# Patient Record
Sex: Female | Born: 1980
Health system: Southern US, Community
[De-identification: ages and names within clinical notes are randomized; demographics above are authoritative.]

## PROBLEM LIST (undated history)

## (undated) DIAGNOSIS — I10 Essential (primary) hypertension: Secondary | ICD-10-CM

## (undated) DIAGNOSIS — S43002A Unspecified subluxation of left shoulder joint, initial encounter: Secondary | ICD-10-CM

## (undated) DIAGNOSIS — K219 Gastro-esophageal reflux disease without esophagitis: Secondary | ICD-10-CM

## (undated) DIAGNOSIS — R262 Difficulty in walking, not elsewhere classified: Secondary | ICD-10-CM

## (undated) DIAGNOSIS — Z87820 Personal history of traumatic brain injury: Secondary | ICD-10-CM

## (undated) DIAGNOSIS — T4145XA Adverse effect of unspecified anesthetic, initial encounter: Secondary | ICD-10-CM

## (undated) DIAGNOSIS — T8859XA Other complications of anesthesia, initial encounter: Secondary | ICD-10-CM

## (undated) DIAGNOSIS — R252 Cramp and spasm: Secondary | ICD-10-CM

---

## 2001-12-09 HISTORY — PX: TUBAL LIGATION: SHX77

## 2003-07-14 ENCOUNTER — Encounter: Payer: Self-pay | Admitting: Oral & Maxillofacial Surgery

## 2003-07-14 ENCOUNTER — Inpatient Hospital Stay (HOSPITAL_COMMUNITY): Admission: AD | Admit: 2003-07-14 | Discharge: 2003-07-16 | Payer: Self-pay | Admitting: Dentistry

## 2003-07-15 ENCOUNTER — Encounter: Payer: Self-pay | Admitting: Oral & Maxillofacial Surgery

## 2014-01-04 ENCOUNTER — Emergency Department (HOSPITAL_COMMUNITY): Payer: No Typology Code available for payment source

## 2014-01-04 ENCOUNTER — Inpatient Hospital Stay (HOSPITAL_COMMUNITY)
Admission: EM | Admit: 2014-01-04 | Discharge: 2014-02-07 | DRG: 003 | Disposition: A | Discharge status: Skilled Nursing Facility | Payer: No Typology Code available for payment source | Attending: General Surgery | Admitting: General Surgery

## 2014-01-04 ENCOUNTER — Encounter (HOSPITAL_COMMUNITY): Admission: EM | Disposition: A | Discharge status: Skilled Nursing Facility | Payer: Self-pay | Source: Home / Self Care

## 2014-01-04 ENCOUNTER — Inpatient Hospital Stay (HOSPITAL_COMMUNITY): Payer: No Typology Code available for payment source | Admitting: Anesthesiology

## 2014-01-04 ENCOUNTER — Encounter (HOSPITAL_COMMUNITY): Payer: No Typology Code available for payment source | Admitting: Anesthesiology

## 2014-01-04 ENCOUNTER — Encounter (HOSPITAL_COMMUNITY): Payer: Self-pay | Admitting: Emergency Medicine

## 2014-01-04 DIAGNOSIS — S52022A Displaced fracture of olecranon process without intraarticular extension of left ulna, initial encounter for closed fracture: Secondary | ICD-10-CM | POA: Diagnosis present

## 2014-01-04 DIAGNOSIS — S02401A Maxillary fracture, unspecified, initial encounter for closed fracture: Secondary | ICD-10-CM | POA: Diagnosis present

## 2014-01-04 DIAGNOSIS — S52609A Unspecified fracture of lower end of unspecified ulna, initial encounter for closed fracture: Secondary | ICD-10-CM

## 2014-01-04 DIAGNOSIS — S02109A Fracture of base of skull, unspecified side, initial encounter for closed fracture: Principal | ICD-10-CM | POA: Diagnosis present

## 2014-01-04 DIAGNOSIS — S52202A Unspecified fracture of shaft of left ulna, initial encounter for closed fracture: Secondary | ICD-10-CM | POA: Diagnosis present

## 2014-01-04 DIAGNOSIS — S069X0S Unspecified intracranial injury without loss of consciousness, sequela: Secondary | ICD-10-CM

## 2014-01-04 DIAGNOSIS — I82629 Acute embolism and thrombosis of deep veins of unspecified upper extremity: Secondary | ICD-10-CM | POA: Diagnosis not present

## 2014-01-04 DIAGNOSIS — IMO0002 Reserved for concepts with insufficient information to code with codable children: Secondary | ICD-10-CM | POA: Diagnosis not present

## 2014-01-04 DIAGNOSIS — S43002A Unspecified subluxation of left shoulder joint, initial encounter: Secondary | ICD-10-CM

## 2014-01-04 DIAGNOSIS — Y831 Surgical operation with implant of artificial internal device as the cause of abnormal reaction of the patient, or of later complication, without mention of misadventure at the time of the procedure: Secondary | ICD-10-CM | POA: Diagnosis not present

## 2014-01-04 DIAGNOSIS — R000 Tachycardia, unspecified: Secondary | ICD-10-CM | POA: Diagnosis not present

## 2014-01-04 DIAGNOSIS — S0181XA Laceration without foreign body of other part of head, initial encounter: Secondary | ICD-10-CM | POA: Diagnosis present

## 2014-01-04 DIAGNOSIS — D696 Thrombocytopenia, unspecified: Secondary | ICD-10-CM | POA: Diagnosis present

## 2014-01-04 DIAGNOSIS — Z87820 Personal history of traumatic brain injury: Secondary | ICD-10-CM

## 2014-01-04 DIAGNOSIS — S272XXA Traumatic hemopneumothorax, initial encounter: Secondary | ICD-10-CM | POA: Diagnosis present

## 2014-01-04 DIAGNOSIS — S22009A Unspecified fracture of unspecified thoracic vertebra, initial encounter for closed fracture: Secondary | ICD-10-CM | POA: Diagnosis present

## 2014-01-04 DIAGNOSIS — S12600A Unspecified displaced fracture of seventh cervical vertebra, initial encounter for closed fracture: Secondary | ICD-10-CM | POA: Diagnosis present

## 2014-01-04 DIAGNOSIS — S02400A Malar fracture unspecified, initial encounter for closed fracture: Secondary | ICD-10-CM | POA: Diagnosis present

## 2014-01-04 DIAGNOSIS — E870 Hyperosmolality and hypernatremia: Secondary | ICD-10-CM | POA: Diagnosis not present

## 2014-01-04 DIAGNOSIS — J95821 Acute postprocedural respiratory failure: Secondary | ICD-10-CM

## 2014-01-04 DIAGNOSIS — S0280XA Fracture of other specified skull and facial bones, unspecified side, initial encounter for closed fracture: Secondary | ICD-10-CM | POA: Diagnosis present

## 2014-01-04 DIAGNOSIS — J189 Pneumonia, unspecified organism: Secondary | ICD-10-CM | POA: Diagnosis not present

## 2014-01-04 DIAGNOSIS — S52302A Unspecified fracture of shaft of left radius, initial encounter for closed fracture: Secondary | ICD-10-CM

## 2014-01-04 DIAGNOSIS — S42309B Unspecified fracture of shaft of humerus, unspecified arm, initial encounter for open fracture: Secondary | ICD-10-CM | POA: Diagnosis present

## 2014-01-04 DIAGNOSIS — S06309A Unspecified focal traumatic brain injury with loss of consciousness of unspecified duration, initial encounter: Principal | ICD-10-CM

## 2014-01-04 DIAGNOSIS — S0100XA Unspecified open wound of scalp, initial encounter: Secondary | ICD-10-CM | POA: Diagnosis present

## 2014-01-04 DIAGNOSIS — D62 Acute posthemorrhagic anemia: Secondary | ICD-10-CM | POA: Diagnosis present

## 2014-01-04 DIAGNOSIS — H0520 Unspecified exophthalmos: Secondary | ICD-10-CM | POA: Diagnosis present

## 2014-01-04 DIAGNOSIS — S270XXA Traumatic pneumothorax, initial encounter: Secondary | ICD-10-CM

## 2014-01-04 DIAGNOSIS — J9600 Acute respiratory failure, unspecified whether with hypoxia or hypercapnia: Secondary | ICD-10-CM | POA: Diagnosis present

## 2014-01-04 DIAGNOSIS — S069X9A Unspecified intracranial injury with loss of consciousness of unspecified duration, initial encounter: Secondary | ICD-10-CM

## 2014-01-04 DIAGNOSIS — S0180XA Unspecified open wound of other part of head, initial encounter: Secondary | ICD-10-CM | POA: Diagnosis present

## 2014-01-04 DIAGNOSIS — N179 Acute kidney failure, unspecified: Secondary | ICD-10-CM | POA: Diagnosis present

## 2014-01-04 DIAGNOSIS — S2249XA Multiple fractures of ribs, unspecified side, initial encounter for closed fracture: Secondary | ICD-10-CM | POA: Diagnosis present

## 2014-01-04 DIAGNOSIS — S3609XA Other injury of spleen, initial encounter: Secondary | ICD-10-CM | POA: Diagnosis present

## 2014-01-04 DIAGNOSIS — S42352B Displaced comminuted fracture of shaft of humerus, left arm, initial encounter for open fracture: Secondary | ICD-10-CM | POA: Diagnosis present

## 2014-01-04 DIAGNOSIS — S069XAA Unspecified intracranial injury with loss of consciousness status unknown, initial encounter: Secondary | ICD-10-CM

## 2014-01-04 DIAGNOSIS — S22008A Other fracture of unspecified thoracic vertebra, initial encounter for closed fracture: Secondary | ICD-10-CM | POA: Diagnosis present

## 2014-01-04 DIAGNOSIS — S52509A Unspecified fracture of the lower end of unspecified radius, initial encounter for closed fracture: Secondary | ICD-10-CM | POA: Diagnosis present

## 2014-01-04 DIAGNOSIS — R4189 Other symptoms and signs involving cognitive functions and awareness: Secondary | ICD-10-CM | POA: Diagnosis present

## 2014-01-04 DIAGNOSIS — T17408A Unspecified foreign body in trachea causing other injury, initial encounter: Secondary | ICD-10-CM | POA: Diagnosis not present

## 2014-01-04 DIAGNOSIS — S5290XA Unspecified fracture of unspecified forearm, initial encounter for closed fracture: Secondary | ICD-10-CM

## 2014-01-04 DIAGNOSIS — S0285XA Fracture of orbit, unspecified, initial encounter for closed fracture: Secondary | ICD-10-CM | POA: Diagnosis present

## 2014-01-04 DIAGNOSIS — T8140XA Infection following a procedure, unspecified, initial encounter: Secondary | ICD-10-CM | POA: Diagnosis not present

## 2014-01-04 DIAGNOSIS — S2242XA Multiple fractures of ribs, left side, initial encounter for closed fracture: Secondary | ICD-10-CM | POA: Diagnosis present

## 2014-01-04 DIAGNOSIS — T17808A Unspecified foreign body in other parts of respiratory tract causing other injury, initial encounter: Secondary | ICD-10-CM

## 2014-01-04 DIAGNOSIS — S0292XA Unspecified fracture of facial bones, initial encounter for closed fracture: Secondary | ICD-10-CM

## 2014-01-04 DIAGNOSIS — E876 Hypokalemia: Secondary | ICD-10-CM | POA: Diagnosis not present

## 2014-01-04 DIAGNOSIS — S52023A Displaced fracture of olecranon process without intraarticular extension of unspecified ulna, initial encounter for closed fracture: Secondary | ICD-10-CM | POA: Diagnosis present

## 2014-01-04 DIAGNOSIS — S42001A Fracture of unspecified part of right clavicle, initial encounter for closed fracture: Secondary | ICD-10-CM | POA: Diagnosis present

## 2014-01-04 DIAGNOSIS — S065XAA Traumatic subdural hemorrhage with loss of consciousness status unknown, initial encounter: Principal | ICD-10-CM | POA: Diagnosis present

## 2014-01-04 DIAGNOSIS — T17508A Unspecified foreign body in bronchus causing other injury, initial encounter: Secondary | ICD-10-CM

## 2014-01-04 DIAGNOSIS — R739 Hyperglycemia, unspecified: Secondary | ICD-10-CM | POA: Diagnosis not present

## 2014-01-04 DIAGNOSIS — R4182 Altered mental status, unspecified: Secondary | ICD-10-CM

## 2014-01-04 DIAGNOSIS — S42023A Displaced fracture of shaft of unspecified clavicle, initial encounter for closed fracture: Secondary | ICD-10-CM | POA: Diagnosis present

## 2014-01-04 DIAGNOSIS — S36039A Unspecified laceration of spleen, initial encounter: Secondary | ICD-10-CM | POA: Diagnosis present

## 2014-01-04 DIAGNOSIS — S064XAA Epidural hemorrhage with loss of consciousness status unknown, initial encounter: Secondary | ICD-10-CM | POA: Diagnosis present

## 2014-01-04 DIAGNOSIS — F068 Other specified mental disorders due to known physiological condition: Secondary | ICD-10-CM | POA: Diagnosis present

## 2014-01-04 HISTORY — PX: ORIF RADIAL FRACTURE: SHX5113

## 2014-01-04 HISTORY — PX: ORIF ULNAR FRACTURE: SHX5417

## 2014-01-04 HISTORY — PX: ORIF HUMERUS FRACTURE: SHX2126

## 2014-01-04 HISTORY — DX: Personal history of traumatic brain injury: Z87.820

## 2014-01-04 HISTORY — DX: Unspecified subluxation of left shoulder joint, initial encounter: S43.002A

## 2014-01-04 HISTORY — PX: ORIF ELBOW FRACTURE: SHX5031

## 2014-01-04 LAB — COMPREHENSIVE METABOLIC PANEL
ALK PHOS: 49 U/L (ref 39–117)
ALT: 68 U/L — AB (ref 0–35)
AST: 125 U/L — ABNORMAL HIGH (ref 0–37)
Albumin: 3.8 g/dL (ref 3.5–5.2)
BUN: 13 mg/dL (ref 6–23)
CALCIUM: 8.1 mg/dL — AB (ref 8.4–10.5)
CO2: 19 mEq/L (ref 19–32)
Chloride: 105 mEq/L (ref 96–112)
Creatinine, Ser: 0.63 mg/dL (ref 0.50–1.10)
GFR calc Af Amer: >90 mL/min (ref >90)
GFR calc non Af Amer: >90 mL/min (ref >90)
Glucose, Bld: 191 mg/dL — ABNORMAL HIGH (ref 70–99)
POTASSIUM: 3.3 meq/L — AB (ref 3.7–5.3)
SODIUM: 141 meq/L (ref 137–147)
TOTAL PROTEIN: 6.2 g/dL (ref 6.0–8.3)
Total Bilirubin: 0.2 mg/dL — ABNORMAL LOW (ref 0.3–1.2)

## 2014-01-04 LAB — POCT I-STAT 3, ART BLOOD GAS (G3+)
ACID-BASE DEFICIT: 2 mmol/L (ref 0.0–2.0)
BICARBONATE: 22.5 meq/L (ref 20.0–24.0)
O2 Saturation: 100 %
PCO2 ART: 34.2 mmHg — AB (ref 35.0–45.0)
Patient temperature: 35.7
TCO2: 24 mmol/L (ref 0–100)
pH, Arterial: 7.421 (ref 7.350–7.450)
pO2, Arterial: 371 mmHg — ABNORMAL HIGH (ref 80.0–100.0)

## 2014-01-04 LAB — CBC
HCT: 29.3 % — ABNORMAL LOW (ref 36.0–46.0)
HEMOGLOBIN: 9 g/dL — AB (ref 12.0–15.0)
MCH: 23.2 pg — ABNORMAL LOW (ref 26.0–34.0)
MCHC: 30.7 g/dL (ref 30.0–36.0)
MCV: 75.5 fL — AB (ref 78.0–100.0)
Platelets: 303 10*3/uL (ref 150–400)
RBC: 3.88 MIL/uL (ref 3.87–5.11)
RDW: 16.6 % — AB (ref 11.5–15.5)
WBC: 19.3 10*3/uL — ABNORMAL HIGH (ref 4.0–10.5)

## 2014-01-04 LAB — POCT I-STAT, CHEM 8
BUN: 14 mg/dL (ref 6–23)
Calcium, Ion: 1.02 mmol/L — ABNORMAL LOW (ref 1.12–1.23)
Chloride: 106 mEq/L (ref 96–112)
Creatinine, Ser: 0.8 mg/dL (ref 0.50–1.10)
Glucose, Bld: 187 mg/dL — ABNORMAL HIGH (ref 70–99)
HEMATOCRIT: 29 % — AB (ref 36.0–46.0)
HEMOGLOBIN: 9.9 g/dL — AB (ref 12.0–15.0)
POTASSIUM: 3.3 meq/L — AB (ref 3.7–5.3)
SODIUM: 139 meq/L (ref 137–147)
TCO2: 20 mmol/L (ref 0–100)

## 2014-01-04 LAB — CG4 I-STAT (LACTIC ACID): Lactic Acid, Venous: 2.56 mmol/L — ABNORMAL HIGH (ref 0.5–2.2)

## 2014-01-04 LAB — PROTIME-INR
INR: 1.15 (ref 0.00–1.49)
Prothrombin Time: 14.5 seconds (ref 11.6–15.2)

## 2014-01-04 LAB — MRSA PCR SCREENING: MRSA by PCR: NEGATIVE

## 2014-01-04 LAB — POCT I-STAT TROPONIN I: Troponin i, poc: 0 ng/mL (ref 0.00–0.08)

## 2014-01-04 LAB — LACTIC ACID, PLASMA: LACTIC ACID, VENOUS: 3 mmol/L — AB (ref 0.5–2.2)

## 2014-01-04 LAB — ABO/RH: ABO/RH(D): O NEG

## 2014-01-04 SURGERY — OPEN REDUCTION INTERNAL FIXATION (ORIF) ULNAR FRACTURE
Anesthesia: General | Site: Arm Upper | Laterality: Left

## 2014-01-04 MED ORDER — SODIUM CHLORIDE 0.9 % IJ SOLN
INTRAMUSCULAR | Status: AC
  Filled 2014-01-04: qty 10

## 2014-01-04 MED ORDER — PROPOFOL 10 MG/ML IV EMUL
INTRAVENOUS | Status: AC
  Administered 2014-01-04: 1000 mg
  Filled 2014-01-04: qty 100

## 2014-01-04 MED ORDER — ROCURONIUM BROMIDE 50 MG/5ML IV SOLN
INTRAVENOUS | Status: AC | PRN
  Administered 2014-01-04: 10 mg via INTRAVENOUS

## 2014-01-04 MED ORDER — SODIUM CHLORIDE 0.9 % IV SOLN: INTRAVENOUS | Status: DC | PRN

## 2014-01-04 MED ORDER — IOHEXOL 300 MG/ML  SOLN
100.0000 mL | Freq: Once | INTRAMUSCULAR | Status: AC | PRN
  Administered 2014-01-04: 100 mL via INTRAVENOUS

## 2014-01-04 MED ORDER — BIOTENE DRY MOUTH MT LIQD
15.0000 mL | Freq: Four times a day (QID) | OROMUCOSAL | Status: DC
  Administered 2014-01-05 – 2014-02-07 (×131): 15 mL via OROMUCOSAL

## 2014-01-04 MED ORDER — LACTATED RINGERS IV SOLN
INTRAVENOUS | Status: DC | PRN
  Administered 2014-01-04: 21:00:00 via INTRAVENOUS

## 2014-01-04 MED ORDER — FENTANYL CITRATE 0.05 MG/ML IJ SOLN
100.0000 ug | INTRAMUSCULAR | Status: DC | PRN
  Administered 2014-01-05: 25 ug via INTRAVENOUS
  Administered 2014-01-05: 50 ug via INTRAVENOUS
  Administered 2014-01-05 – 2014-01-13 (×7): 100 ug via INTRAVENOUS
  Filled 2014-01-04 (×10): qty 2

## 2014-01-04 MED ORDER — ETOMIDATE 2 MG/ML IV SOLN
INTRAVENOUS | Status: AC | PRN
  Administered 2014-01-04: 20 mg via INTRAVENOUS

## 2014-01-04 MED ORDER — CLINDAMYCIN PHOSPHATE 600 MG/50ML IV SOLN
600.0000 mg | Freq: Once | INTRAVENOUS | Status: DC
  Filled 2014-01-04: qty 50

## 2014-01-04 MED ORDER — ROCURONIUM BROMIDE 100 MG/10ML IV SOLN
INTRAVENOUS | Status: DC | PRN
  Administered 2014-01-04: 50 mg via INTRAVENOUS
  Administered 2014-01-04: 40 mg via INTRAVENOUS
  Administered 2014-01-04: 50 mg via INTRAVENOUS

## 2014-01-04 MED ORDER — PROPOFOL 10 MG/ML IV EMUL
0.0000 ug/kg/min | INTRAVENOUS | Status: DC
Start: 2014-01-04 — End: 2014-01-04
  Administered 2014-01-04: 20 ug/kg/min via INTRAVENOUS

## 2014-01-04 MED ORDER — CLINDAMYCIN PHOSPHATE 600 MG/50ML IV SOLN
INTRAVENOUS | Status: AC
  Administered 2014-01-04: 600 mg via INTRAVENOUS
  Filled 2014-01-04: qty 50

## 2014-01-04 MED ORDER — SUFENTANIL CITRATE 50 MCG/ML IV SOLN
INTRAVENOUS | Status: AC
  Filled 2014-01-04: qty 1

## 2014-01-04 MED ORDER — ONDANSETRON HCL 4 MG/2ML IJ SOLN: 4.0000 mg | Freq: Four times a day (QID) | INTRAMUSCULAR | Status: DC | PRN

## 2014-01-04 MED ORDER — PANTOPRAZOLE SODIUM 40 MG PO TBEC: 40.0000 mg | DELAYED_RELEASE_TABLET | Freq: Every day | ORAL | Status: DC

## 2014-01-04 MED ORDER — 0.9 % SODIUM CHLORIDE (POUR BTL) OPTIME
TOPICAL | Status: DC | PRN
  Administered 2014-01-04: 1000 mL

## 2014-01-04 MED ORDER — CHLORHEXIDINE GLUCONATE 0.12 % MT SOLN
15.0000 mL | Freq: Two times a day (BID) | OROMUCOSAL | Status: DC
  Administered 2014-01-05 – 2014-02-07 (×65): 15 mL via OROMUCOSAL
  Filled 2014-01-04 (×60): qty 15

## 2014-01-04 MED ORDER — ONDANSETRON HCL 4 MG PO TABS: 4.0000 mg | ORAL_TABLET | Freq: Four times a day (QID) | ORAL | Status: DC | PRN

## 2014-01-04 MED ORDER — ROCURONIUM BROMIDE 50 MG/5ML IV SOLN
INTRAVENOUS | Status: AC
  Filled 2014-01-04: qty 1

## 2014-01-04 MED ORDER — PANTOPRAZOLE SODIUM 40 MG IV SOLR
40.0000 mg | Freq: Every day | INTRAVENOUS | Status: DC
  Administered 2014-01-05 – 2014-01-14 (×10): 40 mg via INTRAVENOUS
  Filled 2014-01-04 (×11): qty 40

## 2014-01-04 MED ORDER — SUFENTANIL CITRATE 50 MCG/ML IV SOLN
INTRAVENOUS | Status: DC | PRN
  Administered 2014-01-04 (×2): 10 ug via INTRAVENOUS
  Administered 2014-01-04: 5 ug via INTRAVENOUS
  Administered 2014-01-04: 10 ug via INTRAVENOUS
  Administered 2014-01-04: 20 ug via INTRAVENOUS
  Administered 2014-01-04: 5 ug via INTRAVENOUS
  Administered 2014-01-05 (×2): 10 ug via INTRAVENOUS
  Administered 2014-01-05: 20 ug via INTRAVENOUS

## 2014-01-04 MED ORDER — POTASSIUM CHLORIDE IN NACL 20-0.9 MEQ/L-% IV SOLN
INTRAVENOUS | Status: DC
  Administered 2014-01-05 – 2014-01-07 (×4): via INTRAVENOUS
  Filled 2014-01-04 (×8): qty 1000

## 2014-01-04 MED ORDER — SODIUM CHLORIDE 0.9 % IV SOLN
INTRAVENOUS | Status: DC | PRN
  Administered 2014-01-04 (×3): via INTRAVENOUS

## 2014-01-04 MED ORDER — PROPOFOL 10 MG/ML IV EMUL
0.0000 ug/kg/min | INTRAVENOUS | Status: DC
  Administered 2014-01-05 – 2014-01-06 (×3): 30 ug/kg/min via INTRAVENOUS
  Administered 2014-01-06: 20 ug/kg/min via INTRAVENOUS
  Administered 2014-01-06: 30 ug/kg/min via INTRAVENOUS
  Administered 2014-01-07: 25 ug/kg/min via INTRAVENOUS
  Filled 2014-01-04 (×7): qty 100

## 2014-01-04 SURGICAL SUPPLY — 97 items
BANDAGE ELASTIC 3 VELCRO ST LF (GAUZE/BANDAGES/DRESSINGS) IMPLANT
BANDAGE ELASTIC 4 VELCRO ST LF (GAUZE/BANDAGES/DRESSINGS) ×3 IMPLANT
BANDAGE ELASTIC 6 VELCRO ST LF (GAUZE/BANDAGES/DRESSINGS) ×3 IMPLANT
BANDAGE GAUZE ELAST BULKY 4 IN (GAUZE/BANDAGES/DRESSINGS) IMPLANT
BIT DRILL 2.5X2.75 QC CALB (BIT) ×3 IMPLANT
BIT DRILL 3.5X5.5 QC CALB (BIT) ×3 IMPLANT
BIT DRILL CALIBRATED 2.7 (BIT) ×3 IMPLANT
BLADE SURG 10 STRL SS (BLADE) ×3 IMPLANT
BLADE SURG 15 STRL LF DISP TIS (BLADE) ×4 IMPLANT
BLADE SURG 15 STRL SS (BLADE) ×2
BNDG COHESIVE 4X5 TAN STRL (GAUZE/BANDAGES/DRESSINGS) ×3 IMPLANT
BNDG ESMARK 4X9 LF (GAUZE/BANDAGES/DRESSINGS) ×3 IMPLANT
BNDG GAUZE ELAST 4 BULKY (GAUZE/BANDAGES/DRESSINGS) ×6 IMPLANT
CANISTER SUCTION 2500CC (MISCELLANEOUS) ×3 IMPLANT
COVER SURGICAL LIGHT HANDLE (MISCELLANEOUS) ×6 IMPLANT
CUFF TOURNIQUET SINGLE 18IN (TOURNIQUET CUFF) ×3 IMPLANT
DRAPE EXTREMITY T 121X128X90 (DRAPE) ×3 IMPLANT
DRAPE OEC MINIVIEW 54X84 (DRAPES) ×3 IMPLANT
DRAPE U-SHAPE 47X51 STRL (DRAPES) ×6 IMPLANT
ELECT REM PT RETURN 9FT ADLT (ELECTROSURGICAL) ×3
ELECTRODE REM PT RTRN 9FT ADLT (ELECTROSURGICAL) ×2 IMPLANT
FACESHIELD OPICON STD (MASK) ×3 IMPLANT
GAUZE XEROFORM 5X9 LF (GAUZE/BANDAGES/DRESSINGS) ×12 IMPLANT
GLOVE BIO SURGEON STRL SZ7.5 (GLOVE) ×6 IMPLANT
GLOVE BIOGEL PI IND STRL 6.5 (GLOVE) ×6 IMPLANT
GLOVE BIOGEL PI IND STRL 7.5 (GLOVE) ×2 IMPLANT
GLOVE BIOGEL PI IND STRL 8 (GLOVE) ×6 IMPLANT
GLOVE BIOGEL PI INDICATOR 6.5 (GLOVE) ×3
GLOVE BIOGEL PI INDICATOR 7.5 (GLOVE) ×1
GLOVE BIOGEL PI INDICATOR 8 (GLOVE) ×3
GLOVE ECLIPSE 6.5 STRL STRAW (GLOVE) ×3 IMPLANT
GLOVE ECLIPSE 7.5 STRL STRAW (GLOVE) ×6 IMPLANT
GLOVE SURG SS PI 7.5 STRL IVOR (GLOVE) ×6 IMPLANT
GOWN PREVENTION PLUS XLARGE (GOWN DISPOSABLE) IMPLANT
GOWN SRG XL XLNG 56XLVL 4 (GOWN DISPOSABLE) IMPLANT
GOWN STRL NON-REIN LRG LVL3 (GOWN DISPOSABLE) IMPLANT
GOWN STRL NON-REIN XL XLG LVL4 (GOWN DISPOSABLE)
GOWN STRL REUS W/ TWL LRG LVL3 (GOWN DISPOSABLE) ×2 IMPLANT
GOWN STRL REUS W/ TWL XL LVL3 (GOWN DISPOSABLE) ×4 IMPLANT
GOWN STRL REUS W/TWL LRG LVL3 (GOWN DISPOSABLE) ×1
GOWN STRL REUS W/TWL XL LVL3 (GOWN DISPOSABLE) ×2
K-WIRE ACE 1.6X6 (WIRE) ×3
K-WIRE FIXATION 2.0X6 (WIRE) ×6
KIT BASIN OR (CUSTOM PROCEDURE TRAY) ×3 IMPLANT
KIT ROOM TURNOVER OR (KITS) ×3 IMPLANT
KWIRE ACE 1.6X6 (WIRE) ×2 IMPLANT
KWIRE FIXATION 2.0X6 (WIRE) ×4 IMPLANT
NEEDLE 22X1 1/2 (OR ONLY) (NEEDLE) IMPLANT
NS IRRIG 1000ML POUR BTL (IV SOLUTION) ×3 IMPLANT
PACK ORTHO EXTREMITY (CUSTOM PROCEDURE TRAY) ×3 IMPLANT
PAD ABD 8X10 STRL (GAUZE/BANDAGES/DRESSINGS) ×9 IMPLANT
PAD ARMBOARD 7.5X6 YLW CONV (MISCELLANEOUS) ×6 IMPLANT
PAD CAST 4YDX4 CTTN HI CHSV (CAST SUPPLIES) IMPLANT
PADDING CAST COTTON 4X4 STRL (CAST SUPPLIES)
PADDING CAST COTTON 6X4 STRL (CAST SUPPLIES) ×3 IMPLANT
PLATE DIS HUMERUS SM EX-ARTIC (Plate) ×3 IMPLANT
PLATE LOCK 8H 103 BILAT FIB (Plate) ×3 IMPLANT
PLATE LOCK COMP 6H FOOT (Plate) ×3 IMPLANT
PLATE OLECRANON SM (Plate) ×3 IMPLANT
SCREW CORT T15 24X3.5XST LCK (Screw) ×2 IMPLANT
SCREW CORTICAL 3.5MM  12MM (Screw) ×1 IMPLANT
SCREW CORTICAL 3.5MM  20MM (Screw) ×3 IMPLANT
SCREW CORTICAL 3.5MM  28MM (Screw) ×1 IMPLANT
SCREW CORTICAL 3.5MM 12MM (Screw) ×2 IMPLANT
SCREW CORTICAL 3.5MM 14MM (Screw) ×9 IMPLANT
SCREW CORTICAL 3.5MM 20MM (Screw) ×6 IMPLANT
SCREW CORTICAL 3.5MM 22MM (Screw) ×9 IMPLANT
SCREW CORTICAL 3.5MM 28MM (Screw) ×2 IMPLANT
SCREW CORTICAL 3.5X24MM (Screw) ×1 IMPLANT
SCREW LOCK CORT STAR 3.5X10 (Screw) ×30 IMPLANT
SCREW LOCK CORT STAR 3.5X12 (Screw) ×6 IMPLANT
SCREW LOCK CORT STAR 3.5X14 (Screw) ×3 IMPLANT
SCREW LOCK CORT STAR 3.5X16 (Screw) ×6 IMPLANT
SCREW LOCK CORT STAR 3.5X18 (Screw) ×3 IMPLANT
SCREW LOW PROFILE 12MMX3.5MM (Screw) ×3 IMPLANT
SCREW LOW PROFILE 18MMX3.5MM (Screw) ×6 IMPLANT
SCREW LP 3.5X44 (Screw) ×3 IMPLANT
SCREW NON LOCKING LP 3.5 14MM (Screw) ×3 IMPLANT
SPLINT FIBERGLASS 4X30 (CAST SUPPLIES) ×3 IMPLANT
SPONGE GAUZE 4X4 12PLY (GAUZE/BANDAGES/DRESSINGS) ×6 IMPLANT
SPONGE LAP 18X18 X RAY DECT (DISPOSABLE) ×6 IMPLANT
SPONGE LAP 4X18 X RAY DECT (DISPOSABLE) ×3 IMPLANT
STAPLER VISISTAT 35W (STAPLE) ×12 IMPLANT
STOCKINETTE IMPERVIOUS LG (DRAPES) ×3 IMPLANT
SUCTION FRAZIER TIP 10 FR DISP (SUCTIONS) ×3 IMPLANT
SUT ETHILON 4 0 PS 2 18 (SUTURE) IMPLANT
SUT FIBERWIRE 3-0 18 DIAM 3/8 (SUTURE) ×3
SUT VIC AB 2-0 CT1 27 (SUTURE) ×5
SUT VIC AB 2-0 CT1 TAPERPNT 27 (SUTURE) ×10 IMPLANT
SUTURE FIBERWR 3-0 18 DIAM 3/8 (SUTURE) ×2 IMPLANT
SYR CONTROL 10ML LL (SYRINGE) IMPLANT
TOWEL OR 17X24 6PK STRL BLUE (TOWEL DISPOSABLE) ×3 IMPLANT
TOWEL OR 17X26 10 PK STRL BLUE (TOWEL DISPOSABLE) ×9 IMPLANT
TUBE CONNECTING 12X1/4 (SUCTIONS) ×3 IMPLANT
WASHER 3.5MM (Orthopedic Implant) ×3 IMPLANT
WATER STERILE IRR 1000ML POUR (IV SOLUTION) IMPLANT
YANKAUER SUCT BULB TIP NO VENT (SUCTIONS) ×3 IMPLANT

## 2014-01-04 NOTE — H&P (Signed)
Critical Care 92 minutes. Patient examined and I agree with the assessment and plan I also D/W Dr. Berenice Primas and Dr. Christella Noa, and Dr. Janace Hoard. OK to go to OR with the above. Georganna Skeans, MD, MPH, FACS Pager: [phone]  01/04/2014 5:20 PM

## 2014-01-04 NOTE — OR Nursing (Signed)
Spoke to [place], [place] nurse.  She agreed to update family for OR @ 2245.

## 2014-01-04 NOTE — Consult Note (Signed)
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 extra-axial hematoma. 3. Large left temporal scalp hematoma. Left periorbital soft tissue swelling. 4. Nondisplaced fracture through the lateral wall of the left orbit extending into the squamous level portion of the left temporal bone. There is a small amount of orbital hematoma with mild left proptosis. 5. Nondisplaced left transverse process fractures of C7, T1, and T2. 6. Left pneumothorax, more fully evaluated on separate chest CT. Critical Value/emergent results were called by telephone at the time of interpretation on 01/04/2014 at 4:50 PM to Dr. Donald Siva, who verbally acknowledged these results.   Electronically Signed   By: Logan Bores   On: 01/04/2014 17:27    ROS Blood pressure 130/80, pulse 92, resp. rate 18, SpO2 100.00%. Physical Exam  HENT:  She is sedated and unresponsive. The left eye has a hematoma of the upper eyelid. The pupils seem to be equal but responsive vision cannot be assessed. There is a left eyebrow laceration of approximately 2-1/2 cm. Does pass through the lateral brow. The nose looks clear with no evidence of trauma or hematoma of the septum. Endotracheal tube in the oral cavity. There is a left parietal temporal hematoma or swelling     Assessment/Plan: Left  facial laceration/orbital fracture-the facial laceration was closed after injecting 1% lidocaine with 1 100,000 epinephrine and irrigated with Betadine and saline. The wound was closed with interrupted 4-0 chromic and a 4-0 running nylon. The brow was positioned in its anatomic location. The left hematoma a 18-gauge needle was passed into the area and no aspirate of obvious fluid was obtained. She should not need any repair of the orbital fracture. She should have the eye evaluated by ophthalmology especially with the intraorbital hematoma. Antibiotic cream to the left brow laceration. Sutures out in approximately 7 days. Melissa Montane 01/04/2014, 6:32 PM

## 2014-01-04 NOTE — ED Notes (Addendum)
Pt. Arrived via [facility] ,  Unresponsive, GCS 6, Pt arrived with an NPA in the RT. Nare 100% BVM  Arrived with lsb and c-collar, Pt.has an open lt. humerous fx and closed lt. Radial ulna fx , noted by deformity, intact radial pulse.  3 cm laceration above lt. Eye. And a large hematoma in her lt temporal area. Abrasion to lt. Knee and bruising to lt. Shin area.  Bleeding controlled at the laceration site.

## 2014-01-04 NOTE — ED Notes (Addendum)
Pt.s mother given money that was in pt.s pocket.  Clothes were given to pt.s boyfriend,.

## 2014-01-04 NOTE — ED Notes (Signed)
NOTIFIED DR. THOMPSON IN PERSON OF  [facility] PATIENTS LAB RESULTS OF I-STAT TROPONIN ,CG4+ LACTIC ACID  ,@17 :30PM ,01/04/2014.

## 2014-01-04 NOTE — Anesthesia Preprocedure Evaluation (Addendum)
Anesthesia Evaluation  Patient identified by MRN, date of birth, ID band Patient awake and Patient unresponsive    Reviewed: Allergy & Precautions, H&P , NPO status , Patient's Chart, lab work & pertinent test results, Unable to perform ROS - Chart review only  Airway Mallampati: II TM Distance: >3 FB Neck ROM: full    Dental   Pulmonary neg pulmonary ROS,  breath sounds clear to auscultation    + intubated    Cardiovascular negative cardio ROS  Rhythm:regular Rate:Tachycardia     Neuro/Psych See HPI  C-spine not cleared negative psych ROS   GI/Hepatic negative GI ROS, Neg liver ROS,   Endo/Other  negative endocrine ROS  Renal/GU negative Renal ROS  negative genitourinary   Musculoskeletal   Abdominal   Peds  Hematology negative hematology ROS (+)   Anesthesia Other Findings See surgeon's H&P   Reproductive/Obstetrics negative OB ROS                           Anesthesia Physical Anesthesia Plan  ASA: IV and emergent  Anesthesia Plan: General   Post-op Pain Management:    Induction: Intravenous  Airway Management Planned: Oral ETT  Additional Equipment:   Intra-op Plan:   Post-operative Plan: Post-operative intubation/ventilation  Informed Consent: I have reviewed the patients History and Physical, chart, labs and discussed the procedure including the risks, benefits and alternatives for the proposed anesthesia with the patient or authorized representative who has indicated his/her understanding and acceptance.   Dental Advisory Given  Plan Discussed with: CRNA and Surgeon  Anesthesia Plan Comments:         Anesthesia Quick Evaluation

## 2014-01-04 NOTE — ED Notes (Signed)
NOTIFIED DR. ZAVITZ IN PERSON OF L[facility] PATIENTS LAB RESULTS OF I-STAT CHEM 8 + ,16:38 PM, 0127/2015.

## 2014-01-04 NOTE — ED Notes (Signed)
Pt. transported to 27M-07 on Ventilator, for surgery this p/m., AMBU bag/mask placed @ bedside, unit RT notified, Fi02 <'d to 40% while awaiting transport to surgery, uneventful transport.

## 2014-01-04 NOTE — ED Notes (Addendum)
Started Driprivan at 10 mcg and increased to 15 mcg/kg/min  Pt began to move her rt. Arm . Edd Arbour, RN at the bedside calming pt.
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
## 2014-01-04 NOTE — Procedures (Signed)
Chest Tube Insertion Procedure Note  Indications:  Clinically significant Left Pneumothorax  Pre-operative Diagnosis: Left Pneumothorax  Post-operative Diagnosis: Left Pneumothorax  Procedure Details  Emergency consent was obtained for the procedure.Time out done.  After sterile skin prep, using standard technique, a 20 French tube was placed in the left anterior axillary line, nipple level.  Findings: Small rush of air  Estimated Blood Loss:  Minimal         Specimens:  None              Complications:  none         Disposition: ED, intubated and critically ill         Georganna Skeans, MD, MPH, FACS Pager: [phone]

## 2014-01-04 NOTE — Progress Notes (Signed)
Correction to previous note: Chaplain notes for different patient.   Corrected Note is as follows:  Chaplain asked to help with contacting family. Chaplain called several numbers. The first number was disconnected. The second number, the chaplain reached the patient's ex-husband and the third number was for a different person. Chaplain provided ED secretary with details.

## 2014-01-04 NOTE — ED Notes (Signed)
Dr. Janace Hoard at the bedside suturing laceration of the lt. eye

## 2014-01-04 NOTE — ED Notes (Signed)
Informed Sydney RN that pt.s family is in the Waiting area on the 3rd floor. Pt. Transferred to 3M07 with assist of resp. Therapist and Marissa Calamity and Cecille Rubin, RN
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
## 2014-01-04 NOTE — ED Notes (Signed)
Pt. s family, parents, children an boyfriend at the bedside.  Chaplain with the family.
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
## 2014-01-04 NOTE — Progress Notes (Signed)
Chaplain in ED on another call. Nurse asked chaplain to provide support to patient's family. Chaplain offered emotional and spiritual support to family.   01/04/14 1649  Clinical Encounter Type  Visited With Family  Visit Type Initial;ED  Referral From Nurse

## 2014-01-04 NOTE — Consult Note (Signed)
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 hypopharynx. Left-sided pneumothorax and parenchymal lung opacity is partially visualized.  IMPRESSION: 1. Several punctate foci of hyperattenuation within both cerebral hemispheres consistent with small foci of hemorrhagic contusion. 2. Tiny left temporoparietal extra-axial hematoma. 3. Large left temporal scalp hematoma. Left periorbital soft tissue swelling. 4. Nondisplaced fracture through the lateral wall of the left orbit extending into the squamous level portion of the left temporal bone. There is a small amount of orbital hematoma with mild left proptosis. 5. Nondisplaced left transverse process fractures of C7, T1, and T2. 6. Left pneumothorax, more fully evaluated on separate chest CT. Critical Value/emergent results were called by telephone at the time of interpretation on 01/04/2014 at 4:50 PM to Dr. Donald Siva, who verbally acknowledged these results.   Electronically Signed   By: Logan Bores   On: 01/04/2014 17:27    Review of Systems  Unable to perform ROS: patient unresponsive   Blood pressure 148/63, pulse 136, temperature 100.9 F (38.3 C), temperature source Core (Comment), resp. rate 25, weight 66.1 kg (145 lb 11.6 oz), SpO2 100.00%. Physical Exam  Constitutional: She  appears well-developed and well-nourished.  HENT:  Dried blood in hair, periorbital ecchymosis on the left with significant periorbital edema  Eyes: Pupils are equal, round, and reactive to light.  Neurological: She is unresponsive. GCS eye subscore is 1. GCS verbal subscore is 1. GCS motor subscore is 3.  Sedated, paralytics on board for initial exam.  Unable to fully assess sensory, motor, language Did not cough, no corneals,     Assessment/Plan: Pt did improve slightly and I did observe her localizing with right upper extremity. Will not place ventricular catheter at this time. Ventricles are quite small. OK for OR to deal with long bone injuries. Needs repeat ct in am.   Aubryana Vittorio L 01/04/2014, 9:38 PM
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
## 2014-01-05 ENCOUNTER — Inpatient Hospital Stay (HOSPITAL_COMMUNITY): Payer: No Typology Code available for payment source

## 2014-01-05 ENCOUNTER — Encounter (HOSPITAL_COMMUNITY): Payer: Self-pay | Admitting: *Deleted

## 2014-01-05 DIAGNOSIS — S52302A Unspecified fracture of shaft of left radius, initial encounter for closed fracture: Secondary | ICD-10-CM

## 2014-01-05 DIAGNOSIS — S52022A Displaced fracture of olecranon process without intraarticular extension of left ulna, initial encounter for closed fracture: Secondary | ICD-10-CM | POA: Diagnosis present

## 2014-01-05 DIAGNOSIS — S42352B Displaced comminuted fracture of shaft of humerus, left arm, initial encounter for open fracture: Secondary | ICD-10-CM | POA: Diagnosis present

## 2014-01-05 DIAGNOSIS — S52202A Unspecified fracture of shaft of left ulna, initial encounter for closed fracture: Secondary | ICD-10-CM | POA: Diagnosis present

## 2014-01-05 DIAGNOSIS — S42001A Fracture of unspecified part of right clavicle, initial encounter for closed fracture: Secondary | ICD-10-CM | POA: Diagnosis present

## 2014-01-05 DIAGNOSIS — S064XAA Epidural hemorrhage with loss of consciousness status unknown, initial encounter: Secondary | ICD-10-CM | POA: Diagnosis not present

## 2014-01-05 LAB — CBC
HCT: 32.4 % — ABNORMAL LOW (ref 36.0–46.0)
HCT: 29.8 % — ABNORMAL LOW (ref 36.0–46.0)
HEMOGLOBIN: 11.4 g/dL — AB (ref 12.0–15.0)
Hemoglobin: 10.4 g/dL — ABNORMAL LOW (ref 12.0–15.0)
MCH: 27.3 pg (ref 26.0–34.0)
MCH: 27.1 pg (ref 26.0–34.0)
MCHC: 35.2 g/dL (ref 30.0–36.0)
MCHC: 34.9 g/dL (ref 30.0–36.0)
MCV: 77.7 fL — ABNORMAL LOW (ref 78.0–100.0)
MCV: 77.6 fL — ABNORMAL LOW (ref 78.0–100.0)
PLATELETS: 135 10*3/uL — AB (ref 150–400)
Platelets: 135 10*3/uL — ABNORMAL LOW (ref 150–400)
RBC: 4.17 MIL/uL (ref 3.87–5.11)
RBC: 3.84 MIL/uL — ABNORMAL LOW (ref 3.87–5.11)
RDW: 15.3 % (ref 11.5–15.5)
RDW: 15.8 % — ABNORMAL HIGH (ref 11.5–15.5)
WBC: 11.4 10*3/uL — ABNORMAL HIGH (ref 4.0–10.5)
WBC: 10.1 10*3/uL (ref 4.0–10.5)

## 2014-01-05 LAB — TYPE AND SCREEN
ABO/RH(D): O NEG
ANTIBODY SCREEN: NEGATIVE
UNIT DIVISION: 0
UNIT DIVISION: 0
UNIT DIVISION: 0
Unit division: 0
Unit division: 0
Unit division: 0

## 2014-01-05 LAB — BASIC METABOLIC PANEL
BUN: 18 mg/dL (ref 6–23)
BUN: 12 mg/dL (ref 6–23)
CALCIUM: 7.3 mg/dL — AB (ref 8.4–10.5)
CO2: 19 mEq/L (ref 19–32)
CO2: 21 mEq/L (ref 19–32)
Calcium: 7.7 mg/dL — ABNORMAL LOW (ref 8.4–10.5)
Chloride: 111 mEq/L (ref 96–112)
Chloride: 111 mEq/L (ref 96–112)
Creatinine, Ser: 0.68 mg/dL (ref 0.50–1.10)
Creatinine, Ser: 0.67 mg/dL (ref 0.50–1.10)
GFR calc Af Amer: >90 mL/min (ref >90)
Glucose, Bld: 144 mg/dL — ABNORMAL HIGH (ref 70–99)
Glucose, Bld: 124 mg/dL — ABNORMAL HIGH (ref 70–99)
Potassium: 4.6 mEq/L (ref 3.7–5.3)
Potassium: 4.1 mEq/L (ref 3.7–5.3)
SODIUM: 141 meq/L (ref 137–147)
SODIUM: 143 meq/L (ref 137–147)

## 2014-01-05 LAB — BLOOD PRODUCT ORDER (VERBAL) VERIFICATION

## 2014-01-05 LAB — POCT I-STAT 7, (LYTES, BLD GAS, ICA,H+H)
Acid-base deficit: 2 mmol/L (ref 0.0–2.0)
Acid-base deficit: 3 mmol/L — ABNORMAL HIGH (ref 0.0–2.0)
Acid-base deficit: 4 mmol/L — ABNORMAL HIGH (ref 0.0–2.0)
BICARBONATE: 23.6 meq/L (ref 20.0–24.0)
Bicarbonate: 22 meq/L (ref 20.0–24.0)
Bicarbonate: 22.4 meq/L (ref 20.0–24.0)
CALCIUM ION: 1.08 mmol/L — AB (ref 1.12–1.23)
CALCIUM ION: 1.1 mmol/L — AB (ref 1.12–1.23)
Calcium, Ion: 1.07 mmol/L — ABNORMAL LOW (ref 1.12–1.23)
HCT: 20 % — ABNORMAL LOW (ref 36.0–46.0)
HCT: 24 % — ABNORMAL LOW (ref 36.0–46.0)
HCT: 33 % — ABNORMAL LOW (ref 36.0–46.0)
HEMOGLOBIN: 11.2 g/dL — AB (ref 12.0–15.0)
HEMOGLOBIN: 8.2 g/dL — AB (ref 12.0–15.0)
Hemoglobin: 6.8 g/dL — CL (ref 12.0–15.0)
O2 SAT: 100 %
O2 Saturation: 100 %
O2 Saturation: 100 %
PCO2 ART: 45.3 mmHg — AB (ref 35.0–45.0)
PCO2 ART: 45.4 mmHg — AB (ref 35.0–45.0)
PH ART: 7.352 (ref 7.350–7.450)
PH ART: 7.327 — AB (ref 7.350–7.450)
PO2 ART: 453 mmHg — AB (ref 80.0–100.0)
POTASSIUM: 4.6 meq/L (ref 3.7–5.3)
Patient temperature: 37.6
Potassium: 3.3 mEq/L — ABNORMAL LOW (ref 3.7–5.3)
Potassium: 3.9 meq/L (ref 3.7–5.3)
SODIUM: 141 meq/L (ref 137–147)
Sodium: 140 mEq/L (ref 137–147)
Sodium: 140 meq/L (ref 137–147)
TCO2: 23 mmol/L (ref 0–100)
TCO2: 25 mmol/L (ref 0–100)
TCO2: 24 mmol/L (ref 0–100)
pCO2 arterial: 40.6 mmHg (ref 35.0–45.0)
pH, Arterial: 7.294 — ABNORMAL LOW (ref 7.350–7.450)
pO2, Arterial: 457 mmHg — ABNORMAL HIGH (ref 80.0–100.0)
pO2, Arterial: 464 mmHg — ABNORMAL HIGH (ref 80.0–100.0)

## 2014-01-05 LAB — BLOOD GAS, ARTERIAL
Acid-base deficit: 3 mmol/L — ABNORMAL HIGH (ref 0.0–2.0)
BICARBONATE: 21.2 meq/L (ref 20.0–24.0)
Drawn by: 331761
FIO2: 0.4 %
MECHVT: 500 mL
O2 SAT: 99.3 %
PEEP: 5 cmH2O
PH ART: 7.389 (ref 7.350–7.450)
Patient temperature: 98.6
RATE: 15 resp/min
TCO2: 22.3 mmol/L (ref 0–100)
pCO2 arterial: 35.9 mmHg (ref 35.0–45.0)
pO2, Arterial: 175 mmHg — ABNORMAL HIGH (ref 80.0–100.0)

## 2014-01-05 LAB — CDS SEROLOGY

## 2014-01-05 LAB — TRIGLYCERIDES: TRIGLYCERIDES: 70 mg/dL (ref <150)

## 2014-01-05 MED ORDER — ALBUMIN HUMAN 5 % IV SOLN
12.5000 g | Freq: Once | INTRAVENOUS | Status: AC
  Administered 2014-01-05: 12.5 g via INTRAVENOUS
  Filled 2014-01-05: qty 250

## 2014-01-05 MED ORDER — SUFENTANIL CITRATE 50 MCG/ML IV SOLN
INTRAVENOUS | Status: AC
  Filled 2014-01-05: qty 1

## 2014-01-05 MED ORDER — ACETAMINOPHEN 650 MG RE SUPP
650.0000 mg | Freq: Three times a day (TID) | RECTAL | Status: DC | PRN
  Administered 2014-01-05 – 2014-01-06 (×4): 650 mg via RECTAL
  Filled 2014-01-05 (×4): qty 1

## 2014-01-05 MED ORDER — CLINDAMYCIN PHOSPHATE 300 MG/50ML IV SOLN
300.0000 mg | Freq: Three times a day (TID) | INTRAVENOUS | Status: DC
  Administered 2014-01-05: 300 mg via INTRAVENOUS
  Filled 2014-01-05 (×3): qty 50

## 2014-01-05 MED ORDER — GENTAMICIN IN SALINE 1.2-0.9 MG/ML-% IV SOLN
60.0000 mg | Freq: Three times a day (TID) | INTRAVENOUS | Status: AC
  Administered 2014-01-05 – 2014-01-07 (×6): 60 mg via INTRAVENOUS
  Filled 2014-01-05 (×6): qty 50

## 2014-01-05 MED ORDER — CLINDAMYCIN PHOSPHATE 600 MG/50ML IV SOLN
600.0000 mg | Freq: Three times a day (TID) | INTRAVENOUS | Status: AC
  Administered 2014-01-05 – 2014-01-06 (×5): 600 mg via INTRAVENOUS
  Filled 2014-01-05 (×5): qty 50

## 2014-01-05 MED ORDER — ONDANSETRON HCL 4 MG/2ML IJ SOLN
INTRAMUSCULAR | Status: AC
  Filled 2014-01-05: qty 2
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
## 2014-01-05 NOTE — Progress Notes (Signed)
UR completed.  Codi Folkerts, RN BSN [facility] CCM Trauma/Neuro ICU Case Manager [phone]  

## 2014-01-05 NOTE — Anesthesia Postprocedure Evaluation (Signed)
  Anesthesia Post-op Note  Patient: [name]  Procedure(s) Performed: Procedure(s): OPEN REDUCTION INTERNAL FIXATION (ORIF) ULNAR FRACTURE (Left) OPEN REDUCTION INTERNAL FIXATION (ORIF) HUMERAL SHAFT FRACTURE (Left) OPEN REDUCTION INTERNAL FIXATION (ORIF) RADIAL FRACTURE (Left) OPEN REDUCTION INTERNAL FIXATION (ORIF) ELBOW/OLECRANON FRACTURE (Left)  Patient Location: ICU  Anesthesia Type:General  Level of Consciousness: sedated  Airway and Oxygen Therapy: Patient remains intubated per anesthesia plan and Patient placed on Ventilator (see vital sign flow sheet for setting)  Post-op Pain: none  Post-op Assessment: Post-op Vital signs reviewed, Patient's Cardiovascular Status Stable, Respiratory Function Stable, Patent Airway, No signs of Nausea or vomiting and Pain level controlled  Post-op Vital Signs: Reviewed and stable  Complications: No apparent anesthesia complications
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
## 2014-01-05 NOTE — Progress Notes (Signed)
Chaplain assumed this case from daytime chaplain who introduced me to pt's fiance in consult room B. Provided emotional and spiritual support for him until other family members arrived. He and I had prayer together. As family members arrived I brought them from ED waiting area to consult room. As more family and friends arrived I moved the whole group to consult room A so they could have more space. Upon getting clearance from medical staff I brought family members to bedside, first pt's parents, then her fiance and two teenage daughters, then others. Provided much one-on-one support for pt's dad who was particularly distressed. He greatly appreciated the support. When pt was moved to 3M07 I brought family and friends to [facility] waiting area. Pt's dad requested I have prayer with pt's daughters and him. After Dr. Jodie Echevaria examined pt he reported to family that pt did not have blood clot on brain or great pressure on brain. Family took some comfort in that report. Family was allowed to see pt in groups of two until she was taken to surgery for repair of broken arm. Family expressed appreciation for chaplain support.

## 2014-01-05 NOTE — Progress Notes (Signed)
Per radiologist, CXR shows increase of Left pneumothorax to 30%. Will discuss with MD on rounds. Pt weaning, 100% sat RR 19. No distress noted.

## 2014-01-05 NOTE — Progress Notes (Signed)
Subjective: 1 Day Post-Op Procedure(s) (LRB): OPEN REDUCTION INTERNAL FIXATION (ORIF) ULNAR FRACTURE (Left) OPEN REDUCTION INTERNAL FIXATION (ORIF) HUMERAL SHAFT FRACTURE (Left) OPEN REDUCTION INTERNAL FIXATION (ORIF) RADIAL FRACTURE (Left) OPEN REDUCTION INTERNAL FIXATION (ORIF) ELBOW/OLECRANON FRACTURE (Left) Patient intubated. Family states she was able to squeeze with her right hand.     Objective: Vital signs in last 24 hours: Temp:  [97.3 F (36.3 C)-100.9 F (38.3 C)] 100.8 F (38.2 C) (01/28 1200) Pulse Rate:  [86-155] 87 (01/28 1200) Resp:  [11-29] 23 (01/28 1200) BP: (97-159)/(60-101) 126/71 mmHg (01/28 1200) SpO2:  [100 %] 100 % (01/28 1200) Arterial Line BP: (133-297)/(67-287) 156/74 mmHg (01/28 1200) FiO2 (%):  [30 %-100 %] 30 % (01/28 1150) Weight:  [66.1 kg (145 lb 11.6 oz)-72.576 kg (160 lb)] 66.1 kg (145 lb 11.6 oz) (01/28 0900)  Intake/Output from previous day: 01/27 0701 - 01/28 0700 In: 5449.6 [I.V.:4132.6; Blood:1267; IV Piggyback:50] Out: 625 [Urine:525; Blood:100] Intake/Output this shift: Total I/O In: 700 [I.V.:400; IV Piggyback:300] Out: 400 [Urine:400]   Recent Labs  01/04/14 1642 01/04/14 2156 01/04/14 2303 01/05/14 0022 01/05/14 0555  HGB 9.0* 6.8* 8.2* 11.2* 11.4*    Recent Labs  01/04/14 1642  01/05/14 0022 01/05/14 0555  WBC 19.3*  --   --  11.4*  RBC 3.88  --   --  4.17  HCT 29.3*  < > 33.0* 32.4*  PLT 303  --   --  135*  < > = values in this interval not displayed.  Recent Labs  01/04/14 1642  01/05/14 0022 01/05/14 0555  NA 141  < > 140 141  K 3.3*  < > 4.6 4.6  CL 105  --   --  111  CO2 19  --   --  19  BUN 13  --   --  18  CREATININE 0.63  --   --  0.68  GLUCOSE 191*  --   --  144*  CALCIUM 8.1*  --   --  7.3*  < > = values in this interval not displayed.  Recent Labs  01/04/14 1642  INR 1.15   X ray: Right midshaft clavicle fracture.   Left upper extremity exam: Posterior splint intact. Moderate  swelling of fingers. Good capillary refill. Hand warm.  Right clavicle exam: Tender over mid right clavicle.    Assessment/Plan: 1 Day Post-Op Procedure(s) (LRB): OPEN REDUCTION INTERNAL FIXATION (ORIF) ULNAR FRACTURE (Left) OPEN REDUCTION INTERNAL FIXATION (ORIF) HUMERAL SHAFT FRACTURE (Left) OPEN REDUCTION INTERNAL FIXATION (ORIF) RADIAL FRACTURE (Left) OPEN REDUCTION INTERNAL FIXATION (ORIF) ELBOW/OLECRANON FRACTURE (Left) Right midshaft clavicle fracture  Plan: Continue IV abx due to open left humerus fracture.  At some point will apply sling to right upper extremity. Right clavicle fracture is not a surgical problem.  Treatment per trauma team Will follow   [name] G 01/05/2014, 1:46 PM

## 2014-01-05 NOTE — Transfer of Care (Signed)
Immediate Anesthesia Transfer of Care Note  Patient: [name]  Procedure(s) Performed: Procedure(s): OPEN REDUCTION INTERNAL FIXATION (ORIF) ULNAR FRACTURE (Left) OPEN REDUCTION INTERNAL FIXATION (ORIF) HUMERAL SHAFT FRACTURE (Left) OPEN REDUCTION INTERNAL FIXATION (ORIF) RADIAL FRACTURE (Left) OPEN REDUCTION INTERNAL FIXATION (ORIF) ELBOW/OLECRANON FRACTURE (Left)  Patient Location: ICU  Anesthesia Type:General  Level of Consciousness: unresponsive and Patient remains intubated per anesthesia plan  Airway & Oxygen Therapy: Patient remains intubated per anesthesia plan and Patient placed on Ventilator (see vital sign flow sheet for setting)  Post-op Assessment: Report given to NICU RN  Post vital signs: Reviewed and stable  Complications: No apparent anesthesia complications
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
## 2014-01-05 NOTE — Procedures (Signed)
Central Venous Catheter Insertion Procedure Note [name] [id number] 11/05/1981  Procedure: Insertion of Central Venous Catheter Indications: Drug and/or fluid administration  Procedure Details Consent: Risks of procedure as well as the alternatives and risks of each were explained to the (patient/caregiver).  Consent for procedure obtained. Time Out: Verified patient identification, verified procedure, site/side was marked, verified correct patient position, special equipment/implants available, medications/allergies/relevent history reviewed, required imaging and test results available.  Performed  Maximum sterile technique was used including antiseptics, cap, gloves, gown, hand hygiene, mask and sheet. Skin prep: Chlorhexidine; local anesthetic administered A antimicrobial bonded/coated triple lumen catheter was placed in the left subclavian vein using the Seldinger technique.  Evaluation Blood flow good Complications: No apparent complications Patient did tolerate procedure well. Chest X-ray ordered to verify placement.  CXR: pending.  Sajid Ruppert E 01/05/2014, 9:50 AM
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
## 2014-01-06 ENCOUNTER — Encounter (HOSPITAL_COMMUNITY): Payer: Self-pay | Admitting: Orthopedic Surgery

## 2014-01-06 ENCOUNTER — Inpatient Hospital Stay (HOSPITAL_COMMUNITY): Payer: No Typology Code available for payment source

## 2014-01-06 DIAGNOSIS — E871 Hypo-osmolality and hyponatremia: Secondary | ICD-10-CM

## 2014-01-06 LAB — BASIC METABOLIC PANEL
BUN: 9 mg/dL (ref 6–23)
BUN: 9 mg/dL (ref 6–23)
CHLORIDE: 115 meq/L — AB (ref 96–112)
CO2: 22 meq/L (ref 19–32)
CO2: 21 meq/L (ref 19–32)
CREATININE: 0.6 mg/dL (ref 0.50–1.10)
Calcium: 8.1 mg/dL — ABNORMAL LOW (ref 8.4–10.5)
Calcium: 8.3 mg/dL — ABNORMAL LOW (ref 8.4–10.5)
Chloride: 117 mEq/L — ABNORMAL HIGH (ref 96–112)
Creatinine, Ser: 0.6 mg/dL (ref 0.50–1.10)
GFR calc Af Amer: >90 mL/min (ref >90)
GFR calc Af Amer: >90 mL/min (ref >90)
GFR calc non Af Amer: >90 mL/min (ref >90)
GFR calc non Af Amer: >90 mL/min (ref >90)
Glucose, Bld: 116 mg/dL — ABNORMAL HIGH (ref 70–99)
Glucose, Bld: 118 mg/dL — ABNORMAL HIGH (ref 70–99)
Potassium: 4.1 mEq/L (ref 3.7–5.3)
Potassium: 4.1 mEq/L (ref 3.7–5.3)
SODIUM: 150 meq/L — AB (ref 137–147)
Sodium: 148 mEq/L — ABNORMAL HIGH (ref 137–147)

## 2014-01-06 LAB — CBC
HCT: 28.6 % — ABNORMAL LOW (ref 36.0–46.0)
Hemoglobin: 9.8 g/dL — ABNORMAL LOW (ref 12.0–15.0)
MCH: 27 pg (ref 26.0–34.0)
MCHC: 34.3 g/dL (ref 30.0–36.0)
MCV: 78.8 fL (ref 78.0–100.0)
PLATELETS: 126 10*3/uL — AB (ref 150–400)
RBC: 3.63 MIL/uL — AB (ref 3.87–5.11)
RDW: 16.4 % — ABNORMAL HIGH (ref 11.5–15.5)
WBC: 10.1 10*3/uL (ref 4.0–10.5)

## 2014-01-06 LAB — GLUCOSE, CAPILLARY
GLUCOSE-CAPILLARY: 112 mg/dL — AB (ref 70–99)
Glucose-Capillary: 121 mg/dL — ABNORMAL HIGH (ref 70–99)
Glucose-Capillary: 124 mg/dL — ABNORMAL HIGH (ref 70–99)

## 2014-01-06 LAB — POCT I-STAT 3, ART BLOOD GAS (G3+)
Acid-base deficit: 2 mmol/L (ref 0.0–2.0)
BICARBONATE: 22.4 meq/L (ref 20.0–24.0)
O2 SAT: 97 %
PO2 ART: 92 mmHg (ref 80.0–100.0)
TCO2: 23 mmol/L (ref 0–100)
pCO2 arterial: 35.4 mmHg (ref 35.0–45.0)
pH, Arterial: 7.41 (ref 7.350–7.450)

## 2014-01-06 MED ORDER — PIVOT 1.5 CAL PO LIQD
1000.0000 mL | ORAL | Status: DC
  Administered 2014-01-07: 1000 mL
  Filled 2014-01-06 (×4): qty 1000

## 2014-01-06 MED ORDER — SELENIUM 50 MCG PO TABS
200.0000 ug | ORAL_TABLET | Freq: Every day | ORAL | Status: AC
  Administered 2014-01-06 – 2014-01-11 (×6): 200 ug
  Filled 2014-01-06 (×8): qty 4

## 2014-01-06 MED ORDER — PIVOT 1.5 CAL PO LIQD
1000.0000 mL | ORAL | Status: DC
  Administered 2014-01-06: 1000 mL
  Filled 2014-01-06 (×2): qty 1000

## 2014-01-06 MED ORDER — FREE WATER
200.0000 mL | Freq: Three times a day (TID) | Status: DC
  Administered 2014-01-06 – 2014-01-07 (×3): 200 mL

## 2014-01-06 MED ORDER — VITAMIN C 500 MG PO TABS
1000.0000 mg | ORAL_TABLET | Freq: Three times a day (TID) | ORAL | Status: AC
  Administered 2014-01-06 – 2014-01-13 (×19): 1000 mg
  Filled 2014-01-06 (×21): qty 2

## 2014-01-06 MED ORDER — PRO-STAT SUGAR FREE PO LIQD
30.0000 mL | Freq: Two times a day (BID) | ORAL | Status: DC
  Administered 2014-01-06 – 2014-01-08 (×5): 30 mL
  Filled 2014-01-06 (×6): qty 30
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
## 2014-01-06 NOTE — Clinical Documentation Improvement (Signed)
THIS DOCUMENT IS NOT A PERMANENT PART OF THE MEDICAL RECORD  Please update your documentation within the medical record to reflect your response to this query. If you need help knowing how to do this please call [phone].  01/06/14  Dear Dr. Maggie Font Rolley Sims  Abnormal findings (laboratory, x-ray, MRI/CT scans, and other diagnostic results) are not coded and reported unless the physician indicates their clinical significance. The medical record reflects the following clinical findings. If possible, please help by clarifying the diagnostic and/or clinical significance of these abnormal findings. Thank you!  Component      pH, Arterial pCO2 arterial pO2, Arterial  Latest Ref Rng      7.350 - 7.450 35.0 - 45.0 mmHg 80.0 - 100.0 mmHg  01/04/2014     11:03 PM 7.327 (L) 45.3 (H) 464.0 (H)  01/05/2014     12:22 AM 7.294 (L) 45.4 (H) 453.0 (H)   Component      Bicarbonate TCO2 Acid-base deficit  Latest Ref Rng      20.0 - 24.0 mEq/L 0 - 100 mmol/L 0.0 - 2.0 mmol/L  01/04/2014     11:03 PM 23.6 25 2.0  01/05/2014     12:22 AM 22.0 23 4.0 (H)    Possible Clinical Conditions?  - Respiratory Acidosis  - Metabolic Acidosis  - Other condition (please document in the progress notes and/or discharge summary)   Reviewed:  no additional documentation provided   Thank You,  [place] Documentation Specialist: [place]   In a better effort to capture your patient's severity of illness, reflect appropriate length of stay and utilization of resources, a review of the medical record has revealed the following indicators.   Based on your clinical judgment, please clarify and document in a progress note and/or discharge summary the clinical condition associated with the following supporting information: In responding to this query please exercise your independent judgment.  The fact that a query is asked, does not imply that any  particular answer is desired or expected.

## 2014-01-06 NOTE — Clinical Documentation Improvement (Signed)
THIS DOCUMENT IS NOT A PERMANENT PART OF THE MEDICAL RECORD  Please update your documentation with the medical record to reflect your response to this query. If you need help knowing how to do this please call [phone].  01/06/14  Dear Dr. Grandville Silos!  Abnormal findings (laboratory, x-ray, MRI/CT scans, and other diagnostic results) are not coded and reported unless the physician indicates their clinical significance. The medical record reflects the following clinical findings. If possible, please help by clarifying the diagnostic and/or clinical significance of these abnormal findings. Thank you!   Component      HCT Hemoglobin  Latest Ref Rng      36.0 - 46.0 % 12.0 - 15.0 g/dL  01/04/2014     4:30 PM 29.0 (L) 9.9 (L)  01/04/2014     4:42 PM 29.3 (L) 9.0 (L)  01/04/2014     9:56 PM 20.0 (L) 6.8 (LL)  01/04/2014     11:03 PM 24.0 (L) 8.2 (L)  01/05/2014     12:22 AM 33.0 (L) 11.2 (L)  01/05/2014     5:55 AM 32.4 (L) 11.4 (L)  01/05/2014     4:15 PM 29.8 (L) 10.4 (L)  01/06/2014      28.6 (L) 9.8 (L)    Possible Clinical Conditions?  - Expected Acute Blood Loss Anemia  - Acute Blood Loss Anemia  - Other condition (please document in the progress notes and/or discharge summary)   Reviewed: additional documentation in the medical record  Thank You,  [place] Documentation Specialist: [place]    In an effort to better capture your patient's severity of illness, reflect appropriate length of stay and utilization of resources, a review of the patient medical record has revealed the following indicators.   Based on your clinical judgment, please clarify and document in a progress note and/or discharge summary the clinical condition associated with the following supporting information: In responding to this query please exercise your independent judgment.  The fact that a query is asked, does not imply that any  particular answer is desired or expected.

## 2014-01-06 NOTE — Progress Notes (Signed)
Orthopedic Tech Progress Note Patient Details:  [name] 19-Jul-1981 [id number]  Ortho Devices Ortho Device/Splint Location: kuzma sling Ortho Device/Splint Interventions: Application   Cammer, Theodoro Parma 01/06/2014, 9:14 AM

## 2014-01-06 NOTE — Progress Notes (Signed)
Patient ID: [name], female   DOB: Feb 08, 1981, 33 y.o.   MRN: [id number] Dx update: patient has acute blood loss anemia and hypokalemia. Georganna Skeans, MD, MPH, FACS Pager: [phone]

## 2014-01-06 NOTE — Progress Notes (Signed)
Subjective: 2 Days Post-Op Procedure(s) (LRB): OPEN REDUCTION INTERNAL FIXATION (ORIF) ULNAR FRACTURE (Left) OPEN REDUCTION INTERNAL FIXATION (ORIF) HUMERAL SHAFT FRACTURE (Left) OPEN REDUCTION INTERNAL FIXATION (ORIF) RADIAL FRACTURE (Left) OPEN REDUCTION INTERNAL FIXATION (ORIF) ELBOW/OLECRANON FRACTURE (Left) Patient reports pain as sedated.    Objective: Vital signs in last 24 hours: Temp:  [99 F (37.2 C)-100.9 F (38.3 C)] 99.5 F (37.5 C) (01/29 0800) Pulse Rate:  [86-124] 121 (01/29 0800) Resp:  [14-23] 20 (01/29 0800) BP: (110-189)/(67-100) 134/91 mmHg (01/29 0800) SpO2:  [99 %-100 %] 100 % (01/29 0800) Arterial Line BP: (136-173)/(67-87) 160/81 mmHg (01/29 0800) FiO2 (%):  [30 %] 30 % (01/29 0800) Weight:  [145 lb 11.6 oz (66.1 kg)] 145 lb 11.6 oz (66.1 kg) (01/28 0900)  Intake/Output from previous day: 01/28 0701 - 01/29 0700 In: 3005.3 [I.V.:2505.3; IV Piggyback:500] Out: 3620 [Urine:3130; Emesis/NG output:450; Chest Tube:40] Intake/Output this shift: Total I/O In: -  Out: 250 [Urine:250]   Recent Labs  01/04/14 2303 01/05/14 0022 01/05/14 0555 01/05/14 1615 01/06/14 0550  HGB 8.2* 11.2* 11.4* 10.4* 9.8*    Recent Labs  01/05/14 1615 01/06/14 0550  WBC 10.1 10.1  RBC 3.84* 3.63*  HCT 29.8* 28.6*  PLT 135* 126*    Recent Labs  01/05/14 1615 01/06/14 0550  NA 143 150*  K 4.1 4.1  CL 111 117*  CO2 21 22  BUN 12 9  CREATININE 0.67 0.60  GLUCOSE 124* 116*  CALCIUM 7.7* 8.1*    Recent Labs  01/04/14 1642  INR 1.15    Compartment soft  Assessment/Plan: 2 Days Post-Op Procedure(s) (LRB): OPEN REDUCTION INTERNAL FIXATION (ORIF) ULNAR FRACTURE (Left) OPEN REDUCTION INTERNAL FIXATION (ORIF) HUMERAL SHAFT FRACTURE (Left) OPEN REDUCTION INTERNAL FIXATION (ORIF) RADIAL FRACTURE (Left) OPEN REDUCTION INTERNAL FIXATION (ORIF) ELBOW/OLECRANON FRACTURE (Left) Pt also with r clavicle fracture// non op for now will see if limits rehab and  reconsider as she awakens. Monitor exam as patient awakens.    [name] L 01/06/2014, 8:06 AM
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
## 2014-01-06 NOTE — Clinical Documentation Improvement (Signed)
THIS DOCUMENT IS NOT A PERMANENT PART OF THE MEDICAL RECORD  Please update your documentation within the medical record to reflect your response to this query. If you need help knowing how to do this please call [phone].  01/06/14  Dear Dr. Maggie Font Rolley Sims  Abnormal findings (laboratory, x-ray, MRI/CT scans, and other diagnostic results) are not coded and reported unless the physician indicates their clinical significance. The medical record reflects the following clinical findings. If possible, please help by clarifying the diagnostic and/or clinical significance of these abnormal findings. Thank you!   Component      Potassium  Latest Ref Rng      3.7 - 5.3 mEq/L  01/04/2014     4:30 PM 3.3 (L)  01/04/2014     4:42 PM 3.3 (L)  01/04/2014     9:56 PM 3.3 (L)    Possible Clinical Conditions?  - Hypokalemia  - Other condition (please document in the progress notes and/or discharge summary)  Supporting Information:  IVF -  0.9 % NaCl with KCl 20 mEq/ L infusion at 140mls/hr    Reviewed: additional documentation in the medical record   Thank You,  [place] Documentation Specialist: [place]   In a better effort to capture your patient's severity of illness, reflect appropriate length of stay and utilization of resources, a review of the medical record has revealed the following indicators.   Based on your clinical judgment, please clarify and document in a progress note and/or discharge summary the clinical condition associated with the following supporting information: In responding to this query please exercise your independent judgment.  The fact that a query is asked, does not imply that any particular answer is desired or expected.
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
## 2014-01-07 ENCOUNTER — Inpatient Hospital Stay (HOSPITAL_COMMUNITY): Payer: No Typology Code available for payment source

## 2014-01-07 ENCOUNTER — Encounter (HOSPITAL_COMMUNITY): Payer: Self-pay | Admitting: Radiology

## 2014-01-07 DIAGNOSIS — E870 Hyperosmolality and hypernatremia: Secondary | ICD-10-CM

## 2014-01-07 LAB — BLOOD GAS, ARTERIAL
Acid-Base Excess: 0.9 mmol/L (ref 0.0–2.0)
Bicarbonate: 25.1 mEq/L — ABNORMAL HIGH (ref 20.0–24.0)
Drawn by: 39898
O2 Content: 30 L/min
O2 Saturation: 97.4 %
PCO2 ART: 40.9 mmHg (ref 35.0–45.0)
PEEP/CPAP: 5 cmH2O
PH ART: 7.405 (ref 7.350–7.450)
Patient temperature: 98.6
RATE: 15 resp/min
TCO2: 26.4 mmol/L (ref 0–100)
VT: 500 mL
pO2, Arterial: 121 mmHg — ABNORMAL HIGH (ref 80.0–100.0)

## 2014-01-07 LAB — GLUCOSE, CAPILLARY
GLUCOSE-CAPILLARY: 133 mg/dL — AB (ref 70–99)
Glucose-Capillary: 130 mg/dL — ABNORMAL HIGH (ref 70–99)
Glucose-Capillary: 118 mg/dL — ABNORMAL HIGH (ref 70–99)
Glucose-Capillary: 128 mg/dL — ABNORMAL HIGH (ref 70–99)
Glucose-Capillary: 139 mg/dL — ABNORMAL HIGH (ref 70–99)

## 2014-01-07 LAB — CBC
HEMATOCRIT: 27 % — AB (ref 36.0–46.0)
Hemoglobin: 9 g/dL — ABNORMAL LOW (ref 12.0–15.0)
MCH: 27.2 pg (ref 26.0–34.0)
MCHC: 33.3 g/dL (ref 30.0–36.0)
MCV: 81.6 fL (ref 78.0–100.0)
Platelets: 136 10*3/uL — ABNORMAL LOW (ref 150–400)
RBC: 3.31 MIL/uL — ABNORMAL LOW (ref 3.87–5.11)
RDW: 17.2 % — ABNORMAL HIGH (ref 11.5–15.5)
WBC: 9.6 10*3/uL (ref 4.0–10.5)

## 2014-01-07 LAB — BASIC METABOLIC PANEL
BUN: 11 mg/dL (ref 6–23)
CHLORIDE: 118 meq/L — AB (ref 96–112)
CO2: 24 mEq/L (ref 19–32)
CREATININE: 0.62 mg/dL (ref 0.50–1.10)
Calcium: 8.4 mg/dL (ref 8.4–10.5)
GFR calc non Af Amer: >90 mL/min (ref >90)
Glucose, Bld: 127 mg/dL — ABNORMAL HIGH (ref 70–99)
Potassium: 4.2 mEq/L (ref 3.7–5.3)
Sodium: 153 mEq/L — ABNORMAL HIGH (ref 137–147)

## 2014-01-07 MED ORDER — FREE WATER
200.0000 mL | Freq: Four times a day (QID) | Status: AC
  Administered 2014-01-07 – 2014-01-11 (×18): 200 mL

## 2014-01-07 MED ORDER — ACETAMINOPHEN 160 MG/5ML PO SOLN
650.0000 mg | ORAL | Status: DC | PRN
  Administered 2014-01-07 – 2014-02-04 (×19): 650 mg
  Filled 2014-01-07 (×19): qty 20.3

## 2014-01-07 MED ORDER — ACETAMINOPHEN 160 MG/5ML PO SOLN
650.0000 mg | ORAL | Status: DC | PRN
  Administered 2014-01-07: 650 mg via ORAL
  Filled 2014-01-07: qty 20.3

## 2014-01-07 MED ORDER — PROPRANOLOL HCL 20 MG/5ML PO SOLN
20.0000 mg | Freq: Four times a day (QID) | ORAL | Status: DC
  Administered 2014-01-07 – 2014-01-23 (×63): 20 mg
  Filled 2014-01-07 (×72): qty 5

## 2014-01-07 MED ORDER — SODIUM CHLORIDE 0.45 % IV SOLN
INTRAVENOUS | Status: DC
  Administered 2014-01-07 – 2014-01-11 (×3): via INTRAVENOUS
  Filled 2014-01-07 (×8): qty 1000
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
## 2014-01-07 NOTE — Progress Notes (Addendum)
I saw the patient, participated in the exam and medical decision making, and concur with the physician assistant's note above.  Propofol off Neuro exam unchanged No edema; good cap refill in LUE Soft, nd Reg cta anteriorly b/l No air leak on CT  See plan in PA note Repeat labs in am to monitor hypernatremia Febrile last night - but wbc ok. Follow wbc.  CXR in am to monitor CT on water seal TF goal may have to be revisited by nutrition since propofol off Cont to wean to minimal vent setting but no extubation  Leighton Ruff. Redmond Pulling, MD, FACS General, Bariatric, & Minimally Invasive Surgery [facility], [place]
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
## 2014-01-07 NOTE — Progress Notes (Signed)
Patient ID: [name], female   DOB: [birth year], 33 y.o.   MRN: [id number] BP 115/64  Pulse 64  Temp(Src) 101 F (38.3 C) (Axillary)  Resp 22  Ht 5\' 5"  (1.651 m)  Wt 68.2 kg (150 lb 5.7 oz)  BMI 25.02 kg/m2  SpO2 100% Not reponsive, may open eyes with noxious stimuli +corneals, +cough,+gag Extension of right upper extremity with noxious stimuli. Not localizing. Flexes lower extremities briskly with noxious stimuli. Head  CT looks unchanged. The right posterior temporal contusion has matured. Basal cisterns widely patent, ventricles not effaced. Since trauma has ordered MRI's of the spine I will add a brain mri. The current brace can be removed, I thought we were going to get a corset not the TL brace that is on. Cervical collar should remain.

## 2014-01-07 NOTE — Progress Notes (Signed)
Subjective: 3 Days Post-Op Procedure(s) (LRB): OPEN REDUCTION INTERNAL FIXATION (ORIF) ULNAR FRACTURE (Left) OPEN REDUCTION INTERNAL FIXATION (ORIF) HUMERAL SHAFT FRACTURE (Left) OPEN REDUCTION INTERNAL FIXATION (ORIF) RADIAL FRACTURE (Left) OPEN REDUCTION INTERNAL FIXATION (ORIF) ELBOW/OLECRANON FRACTURE (Left) Patient reports pain as sedated.    Objective: Vital signs in last 24 hours: Temp:  [98.8 F (37.1 C)-102 F (38.9 C)] 99.8 F (37.7 C) (01/30 1200) Pulse Rate:  [51-124] 79 (01/30 1400) Resp:  [15-28] 27 (01/30 1400) BP: (119-186)/(65-105) 127/74 mmHg (01/30 1400) SpO2:  [99 %-100 %] 100 % (01/30 1400) Arterial Line BP: (144-185)/(67-100) 170/81 mmHg (01/30 1300) FiO2 (%):  [30 %] 30 % (01/30 1300) Weight:  [149 lb 7.6 oz (67.8 kg)-150 lb 5.7 oz (68.2 kg)] 150 lb 5.7 oz (68.2 kg) (01/30 0500)  Intake/Output from previous day: 01/29 0701 - 01/30 0700 In: 3365.9 [I.V.:2615.7; NG/GT:550.2; IV Piggyback:200] Out: 2345 [Urine:2345] Intake/Output this shift: Total I/O In: 669.1 [I.V.:424.1; NG/GT:245] Out: 1325 [Urine:1325]   Recent Labs  01/05/14 0022 01/05/14 0555 01/05/14 1615 01/06/14 0550 01/07/14 0530  HGB 11.2* 11.4* 10.4* 9.8* 9.0*    Recent Labs  01/06/14 0550 01/07/14 0530  WBC 10.1 9.6  RBC 3.63* 3.31*  HCT 28.6* 27.0*  PLT 126* 136*    Recent Labs  01/06/14 1500 01/07/14 0530  NA 148* 153*  K 4.1 4.2  CL 115* 118*  CO2 21 24  BUN 9 11  CREATININE 0.60 0.62  GLUCOSE 118* 127*  CALCIUM 8.3* 8.4    Recent Labs  01/04/14 1642  INR 1.15    No cellulitis present Compartment soft  Assessment/Plan: 3 Days Post-Op Procedure(s) (LRB): OPEN REDUCTION INTERNAL FIXATION (ORIF) ULNAR FRACTURE (Left) OPEN REDUCTION INTERNAL FIXATION (ORIF) HUMERAL SHAFT FRACTURE (Left) OPEN REDUCTION INTERNAL FIXATION (ORIF) RADIAL FRACTURE (Left) OPEN REDUCTION INTERNAL FIXATION (ORIF) ELBOW/OLECRANON FRACTURE (Left) r clavicle fracture May be  mobalized whrn appropriate// will follow exam  Jamice Carreno L 01/07/2014, 2:35 PM

## 2014-01-08 ENCOUNTER — Inpatient Hospital Stay (HOSPITAL_COMMUNITY): Payer: No Typology Code available for payment source

## 2014-01-08 LAB — GLUCOSE, CAPILLARY
GLUCOSE-CAPILLARY: 135 mg/dL — AB (ref 70–99)
GLUCOSE-CAPILLARY: 120 mg/dL — AB (ref 70–99)
Glucose-Capillary: 134 mg/dL — ABNORMAL HIGH (ref 70–99)
Glucose-Capillary: 145 mg/dL — ABNORMAL HIGH (ref 70–99)
Glucose-Capillary: 142 mg/dL — ABNORMAL HIGH (ref 70–99)

## 2014-01-08 LAB — CBC
HCT: 30.1 % — ABNORMAL LOW (ref 36.0–46.0)
Hemoglobin: 9.9 g/dL — ABNORMAL LOW (ref 12.0–15.0)
MCH: 27.3 pg (ref 26.0–34.0)
MCHC: 32.9 g/dL (ref 30.0–36.0)
MCV: 82.9 fL (ref 78.0–100.0)
PLATELETS: 186 10*3/uL (ref 150–400)
RBC: 3.63 MIL/uL — ABNORMAL LOW (ref 3.87–5.11)
RDW: 17.5 % — ABNORMAL HIGH (ref 11.5–15.5)
WBC: 14.9 10*3/uL — ABNORMAL HIGH (ref 4.0–10.5)

## 2014-01-08 LAB — BASIC METABOLIC PANEL
BUN: 14 mg/dL (ref 6–23)
CALCIUM: 8.8 mg/dL (ref 8.4–10.5)
CO2: 24 mEq/L (ref 19–32)
Chloride: 114 mEq/L — ABNORMAL HIGH (ref 96–112)
Creatinine, Ser: 0.62 mg/dL (ref 0.50–1.10)
GFR calc Af Amer: >90 mL/min (ref >90)
GFR calc non Af Amer: >90 mL/min (ref >90)
Glucose, Bld: 132 mg/dL — ABNORMAL HIGH (ref 70–99)
Potassium: 3.9 mEq/L (ref 3.7–5.3)
Sodium: 151 mEq/L — ABNORMAL HIGH (ref 137–147)

## 2014-01-08 MED ORDER — PIVOT 1.5 CAL PO LIQD
1000.0000 mL | ORAL | Status: AC
  Administered 2014-01-08 – 2014-01-11 (×4): 1000 mL
  Filled 2014-01-08 (×5): qty 1000

## 2014-01-08 MED ORDER — PRO-STAT SUGAR FREE PO LIQD
30.0000 mL | Freq: Two times a day (BID) | ORAL | Status: DC
  Administered 2014-01-08 – 2014-01-11 (×7): 30 mL
  Filled 2014-01-08 (×8): qty 30
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
## 2014-01-08 NOTE — Progress Notes (Signed)
Patient transported to MRI. No complications. Uneventful trip. Vitals stable throughout. RT will continue to monitor.

## 2014-01-08 NOTE — Progress Notes (Signed)
Subjective: Patient reports Remains comatose  Objective: Vital signs in last 24 hours: Temp:  [99.8 F (37.7 C)-102.6 F (39.2 C)] 101.1 F (38.4 C) (01/31 0453) Pulse Rate:  [62-104] 92 (01/31 0744) Resp:  [16-28] 26 (01/31 0744) BP: (111-173)/(58-94) 156/73 mmHg (01/31 0744) SpO2:  [100 %] 100 % (01/31 0744) Arterial Line BP: (110-177)/(53-84) 168/81 mmHg (01/31 0600) FiO2 (%):  [30 %] 30 % (01/31 0745) Weight:  [68.8 kg (151 lb 10.8 oz)] 68.8 kg (151 lb 10.8 oz) (01/31 0500)  Intake/Output from previous day: 01/30 0701 - 01/31 0700 In: 2429.1 [I.V.:1224.1; NG/GT:1205] Out: 3230 [Urine:3150; Emesis/NG output:60; Chest Tube:20] Intake/Output this shift:    Comatose pupils reactive some flexion withdrawal in the upper extremity this morning slightly improved from exam last night however still intermittently extensor posturing per the nurses  Lab Results:  Recent Labs  01/07/14 0530 01/08/14 0500  WBC 9.6 14.9*  HGB 9.0* 9.9*  HCT 27.0* 30.1*  PLT 136* 186   BMET  Recent Labs  01/07/14 0530 01/08/14 0500  NA 153* 151*  K 4.2 3.9  CL 118* 114*  CO2 24 24  GLUCOSE 127* 132*  BUN 11 14  CREATININE 0.62 0.62  CALCIUM 8.4 8.8    Studies/Results: Ct Head Wo Contrast  01/07/2014   CLINICAL DATA:  33 year old female with altered mental status. Neuro ICU patient.  EXAM: Non contrast head CT  TECHNIQUE: Non contrast multidetector CT of the head.  CONTRAST:  None.  COMPARISON:  01/05/2014.  RADIOPHARMACEUTICALS:  None.  FLUOROSCOPY TIME:  None.  FINDINGS: Stable paranasal sinus opacification. Mastoids and tympanic cavities remain clear. Calvarium intact. Left posterior scalp hematoma has diminished. Laceration with small volume gas and hyperdense blood products along the left lateral scalp re- identified. Left periorbital scalp hematoma appears mildly increased.  Basilar cisterns are patent and stable. Hemorrhagic contusion along the posterior right temporal lobe not  significantly changed in size or configuration. Hyperdense blood products here are less distinct. Small bilateral frontal lobe shear hemorrhages are less conspicuous. No ventriculomegaly or intraventricular hemorrhage. No extra-axial hemorrhage identified. No new loss of gray-white matter differentiation. No new cortically based infarct.  IMPRESSION: 1. Right posterior temporal lobe hemorrhagic contusion not significantly changed. 2. Scattered hemispheric shear hemorrhages mildly decreased in conspicuity. 3. No extra-axial hemorrhage identified. No intracranial mass effect is evident. 4. Scalp soft tissue injury re- identified.   Electronically Signed   By: Lars Pinks M.D.   On: 01/07/2014 09:38   Dg Hand 2 View Left  01/07/2014   CLINICAL DATA:  Injury and swelling.  Status post ORIF.  EXAM: LEFT HAND - 2 VIEW  COMPARISON:  One-view forearm and 01/04/2013.  FINDINGS: The patient is status post ORIF in the distal radius and ulna. The fractures are reduced. There is no radiographic complication at the fracture site.  Diffuse soft tissue swelling is present in the hand. This is particularly noted just distal to the fiberglass cast. No focal osseous abnormality is present.  IMPRESSION: 1. Extensive soft tissue swelling the hand without an acute or focal osseous abnormality. 2. Status post ORIF of the distal radius and ulna without radiographic evidence for complication.   Electronically Signed   By: Lawrence Santiago M.D.   On: 01/07/2014 07:27   Dg Chest Port 1 View  01/08/2014   CLINICAL DATA:  Followup pneumothorax.  EXAM: PORTABLE CHEST - 1 VIEW  COMPARISON:  01/07/2014  FINDINGS: A minimal pneumothorax is evident at the left apex. This was  not evident on the previous day study with the difference likely due to differences in patient positioning.  The left chest tube is stable. There is left anterior-lateral chest wall subcutaneous emphysema.  Lungs are clear.  Heart, mediastinum and hila are unremarkable.   Endotracheal tube, nasogastric tube and left subclavian central venous line are well positioned and stable.  IMPRESSION: 1. Tiny left pneumothorax. 2. Clear lungs.  Support apparatus is well positioned and stable.   Electronically Signed   By: Lajean Manes M.D.   On: 01/08/2014 08:02   Dg Chest Port 1 View  01/07/2014   CLINICAL DATA:  Left pneumothorax.  EXAM: PORTABLE CHEST - 1 VIEW  COMPARISON:  01/06/2014  FINDINGS: Endotracheal tube, central line, NG tube, and left-sided chest tube all remain in good position.  No visible residual left pneumothorax. Lungs are clear. Heart size and vascularity are normal. Displaced right clavicle fracture is again noted.  IMPRESSION: Resolution of left pneumothorax.  Clear lungs.   Electronically Signed   By: Rozetta Nunnery M.D.   On: 01/07/2014 07:19    Assessment/Plan: Continue observation MRI pending  LOS: 4 days     [name] 01/08/2014, 8:23 AM
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
## 2014-01-09 ENCOUNTER — Inpatient Hospital Stay (HOSPITAL_COMMUNITY): Payer: No Typology Code available for payment source

## 2014-01-09 LAB — URINE CULTURE
Colony Count: NO GROWTH
Culture: NO GROWTH

## 2014-01-09 LAB — URINALYSIS, ROUTINE W REFLEX MICROSCOPIC
Bilirubin Urine: NEGATIVE
Glucose, UA: NEGATIVE mg/dL
Hgb urine dipstick: NEGATIVE
Ketones, ur: NEGATIVE mg/dL
Leukocytes, UA: NEGATIVE
NITRITE: NEGATIVE
PH: 7.5 (ref 5.0–8.0)
Protein, ur: NEGATIVE mg/dL
SPECIFIC GRAVITY, URINE: 1.016 (ref 1.005–1.030)
Urobilinogen, UA: 1 mg/dL (ref 0.0–1.0)

## 2014-01-09 LAB — GLUCOSE, CAPILLARY
GLUCOSE-CAPILLARY: 114 mg/dL — AB (ref 70–99)
GLUCOSE-CAPILLARY: 136 mg/dL — AB (ref 70–99)
Glucose-Capillary: 127 mg/dL — ABNORMAL HIGH (ref 70–99)
Glucose-Capillary: 131 mg/dL — ABNORMAL HIGH (ref 70–99)
Glucose-Capillary: 137 mg/dL — ABNORMAL HIGH (ref 70–99)
Glucose-Capillary: 151 mg/dL — ABNORMAL HIGH (ref 70–99)

## 2014-01-09 NOTE — Progress Notes (Signed)
Patient ID: [name], female   DOB: Sep 10, 1981, 33 y.o.   MRN: [id number] No significant neurosurgical change fluctuates from extensor posturing to nonpurposeful movements pupils equal and reactive

## 2014-01-09 NOTE — Progress Notes (Signed)
Subjective:  5 Days Post-Op Procedure(s) (LRB):  OPEN REDUCTION INTERNAL FIXATION (ORIF) ULNAR FRACTURE (Left)  OPEN REDUCTION INTERNAL FIXATION (ORIF) HUMERAL SHAFT FRACTURE (Left)  OPEN REDUCTION INTERNAL FIXATION (ORIF) RADIAL FRACTURE (Left)  OPEN REDUCTION INTERNAL FIXATION (ORIF) ELBOW/OLECRANON FRACTURE (Left)  Patient reports pain as sedated.  Objective:  BP 133/82  Pulse 71  Temp(Src) 100.1 F (37.8 C) (Oral)  Resp 23  Ht 5\' 5"  (1.651 m)  Wt 70.2 kg (154 lb 12.2 oz)  BMI 25.75 kg/m2  SpO2 100%  Pt intubated and unresponsive No cellulitis present, Compartments soft  Decreased swelling L hand. Pt positioned in sling appropriately. Cap refill <2sec, Distal radial pulses =BIL.  Dressings C/D/I  Assessment/Plan:  5 Days Post-Op Procedure(s) (LRB):  OPEN REDUCTION INTERNAL FIXATION (ORIF) ULNAR FRACTURE (Left)  OPEN REDUCTION INTERNAL FIXATION (ORIF) HUMERAL SHAFT FRACTURE (Left)  OPEN REDUCTION INTERNAL FIXATION (ORIF) RADIAL FRACTURE (Left)  OPEN REDUCTION INTERNAL FIXATION (ORIF) ELBOW/OLECRANON FRACTURE (Left)  r clavicle fracture stable, family assured "Bump" may decrease in size but will likely be present for some time, no indications for surgical correction at this venture   May be mobalized when appropriate, [facility] team will continue to follow exam
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
## 2014-01-09 NOTE — Progress Notes (Signed)
RT replaced pt tube holder. RN at bedside. No complications. Vital signs stable. RT will continue to monitor.

## 2014-01-10 DIAGNOSIS — S2249XA Multiple fractures of ribs, unspecified side, initial encounter for closed fracture: Secondary | ICD-10-CM

## 2014-01-10 LAB — BASIC METABOLIC PANEL
BUN: 22 mg/dL (ref 6–23)
CO2: 22 mEq/L (ref 19–32)
Calcium: 8.7 mg/dL (ref 8.4–10.5)
Chloride: 104 mEq/L (ref 96–112)
Creatinine, Ser: 0.45 mg/dL — ABNORMAL LOW (ref 0.50–1.10)
GFR calc Af Amer: >90 mL/min (ref >90)
Glucose, Bld: 138 mg/dL — ABNORMAL HIGH (ref 70–99)
POTASSIUM: 3.9 meq/L (ref 3.7–5.3)
SODIUM: 141 meq/L (ref 137–147)

## 2014-01-10 LAB — GLUCOSE, CAPILLARY
GLUCOSE-CAPILLARY: 138 mg/dL — AB (ref 70–99)
GLUCOSE-CAPILLARY: 138 mg/dL — AB (ref 70–99)
GLUCOSE-CAPILLARY: 129 mg/dL — AB (ref 70–99)
Glucose-Capillary: 148 mg/dL — ABNORMAL HIGH (ref 70–99)
Glucose-Capillary: 133 mg/dL — ABNORMAL HIGH (ref 70–99)
Glucose-Capillary: 115 mg/dL — ABNORMAL HIGH (ref 70–99)

## 2014-01-10 LAB — CBC
HEMATOCRIT: 28.8 % — AB (ref 36.0–46.0)
Hemoglobin: 9.2 g/dL — ABNORMAL LOW (ref 12.0–15.0)
MCH: 26.6 pg (ref 26.0–34.0)
MCHC: 31.9 g/dL (ref 30.0–36.0)
MCV: 83.2 fL (ref 78.0–100.0)
Platelets: 215 10*3/uL (ref 150–400)
RBC: 3.46 MIL/uL — ABNORMAL LOW (ref 3.87–5.11)
RDW: 18 % — AB (ref 11.5–15.5)
WBC: 14.7 10*3/uL — ABNORMAL HIGH (ref 4.0–10.5)

## 2014-01-10 MED ORDER — INSULIN ASPART 100 UNIT/ML ~~LOC~~ SOLN
0.0000 [IU] | SUBCUTANEOUS | Status: DC
Start: 2014-01-10 — End: 2014-01-20
  Administered 2014-01-10 – 2014-01-16 (×20): 1 [IU] via SUBCUTANEOUS
  Administered 2014-01-16: 2 [IU] via SUBCUTANEOUS
  Administered 2014-01-16 – 2014-01-17 (×5): 1 [IU] via SUBCUTANEOUS
  Administered 2014-01-17: 05:00:00 via SUBCUTANEOUS
  Administered 2014-01-17 – 2014-01-19 (×6): 1 [IU] via SUBCUTANEOUS
  Administered 2014-01-19: 2 [IU] via SUBCUTANEOUS
  Administered 2014-01-19 – 2014-01-20 (×3): 1 [IU] via SUBCUTANEOUS
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
## 2014-01-11 ENCOUNTER — Inpatient Hospital Stay (HOSPITAL_COMMUNITY): Payer: No Typology Code available for payment source

## 2014-01-11 LAB — GLUCOSE, CAPILLARY
GLUCOSE-CAPILLARY: 120 mg/dL — AB (ref 70–99)
GLUCOSE-CAPILLARY: 125 mg/dL — AB (ref 70–99)
GLUCOSE-CAPILLARY: 130 mg/dL — AB (ref 70–99)
Glucose-Capillary: 124 mg/dL — ABNORMAL HIGH (ref 70–99)
Glucose-Capillary: 117 mg/dL — ABNORMAL HIGH (ref 70–99)
Glucose-Capillary: 131 mg/dL — ABNORMAL HIGH (ref 70–99)
Glucose-Capillary: 122 mg/dL — ABNORMAL HIGH (ref 70–99)

## 2014-01-11 LAB — BASIC METABOLIC PANEL
BUN: 24 mg/dL — ABNORMAL HIGH (ref 6–23)
CO2: 25 mEq/L (ref 19–32)
Calcium: 8.5 mg/dL (ref 8.4–10.5)
Chloride: 103 mEq/L (ref 96–112)
Creatinine, Ser: 0.47 mg/dL — ABNORMAL LOW (ref 0.50–1.10)
GLUCOSE: 115 mg/dL — AB (ref 70–99)
POTASSIUM: 4.1 meq/L (ref 3.7–5.3)
SODIUM: 141 meq/L (ref 137–147)

## 2014-01-11 LAB — CBC
HCT: 28.1 % — ABNORMAL LOW (ref 36.0–46.0)
HEMOGLOBIN: 9 g/dL — AB (ref 12.0–15.0)
MCH: 26.5 pg (ref 26.0–34.0)
MCHC: 32 g/dL (ref 30.0–36.0)
MCV: 82.9 fL (ref 78.0–100.0)
Platelets: 213 10*3/uL (ref 150–400)
RBC: 3.39 MIL/uL — ABNORMAL LOW (ref 3.87–5.11)
RDW: 17.8 % — ABNORMAL HIGH (ref 11.5–15.5)
WBC: 12.5 10*3/uL — ABNORMAL HIGH (ref 4.0–10.5)

## 2014-01-11 MED ORDER — PIVOT 1.5 CAL PO LIQD
1000.0000 mL | ORAL | Status: DC
  Administered 2014-01-13: 1000 mL
  Filled 2014-01-11 (×3): qty 1000

## 2014-01-11 MED ORDER — PRO-STAT SUGAR FREE PO LIQD
30.0000 mL | Freq: Three times a day (TID) | ORAL | Status: DC
  Administered 2014-01-11 – 2014-01-12 (×2): 30 mL
  Filled 2014-01-11 (×7): qty 30

## 2014-01-11 MED ORDER — PIVOT 1.5 CAL PO LIQD
1000.0000 mL | ORAL | Status: DC
Start: 2014-01-11 — End: 2014-01-11
  Administered 2014-01-11: 1000 mL
  Filled 2014-01-11 (×2): qty 1000

## 2014-01-11 NOTE — Progress Notes (Signed)
Subjective: 7 Days Post-Op Procedure(s) (LRB): OPEN REDUCTION INTERNAL FIXATION (ORIF) ULNAR FRACTURE (Left) OPEN REDUCTION INTERNAL FIXATION (ORIF) HUMERAL SHAFT FRACTURE (Left) OPEN REDUCTION INTERNAL FIXATION (ORIF) RADIAL FRACTURE (Left) OPEN REDUCTION INTERNAL FIXATION (ORIF) ELBOW/OLECRANON FRACTURE (Left) Pt is intubated. Apparently getting trach tomorrow.   Objective: Vital signs in last 24 hours: Temp:  [98.7 F (37.1 C)-101.3 F (38.5 C)] 101.3 F (38.5 C) (02/03 1600) Pulse Rate:  [67-103] 80 (02/03 1600) Resp:  [15-24] 19 (02/03 1600) BP: (109-136)/(61-85) 136/79 mmHg (02/03 1600) SpO2:  [40 %-100 %] 40 % (02/03 1544) FiO2 (%):  [30 %-40 %] 40 % (02/03 1544) Weight:  [69.9 kg (154 lb 1.6 oz)] 69.9 kg (154 lb 1.6 oz) (02/03 0429)  Intake/Output from previous day: 02/02 0701 - 02/03 0700 In: 3070 [I.V.:1200; NG/GT:1870] Out: 1790 [Urine:1790] Intake/Output this shift: Total I/O In: 1165 [I.V.:350; NG/GT:815] Out: 850 [Urine:850]   Recent Labs  01/10/14 0930 01/11/14 0420  HGB 9.2* 9.0*    Recent Labs  01/10/14 0930 01/11/14 0420  WBC 14.7* 12.5*  RBC 3.46* 3.39*  HCT 28.8* 28.1*  PLT 215 213    Recent Labs  01/10/14 0930 01/11/14 0420  NA 141 141  K 3.9 4.1  CL 104 103  CO2 22 25  BUN 22 24*  CREATININE 0.45* 0.47*  GLUCOSE 138* 115*  CALCIUM 8.7 8.5   Left upper ext exam: Posterior splint intact. Fingers warm and minimally swollen. Left arm elevated.  Assessment/Plan: 7 Days Post-Op Procedure(s) (LRB): OPEN REDUCTION INTERNAL FIXATION (ORIF) ULNAR FRACTURE (Left) OPEN REDUCTION INTERNAL FIXATION (ORIF) HUMERAL SHAFT FRACTURE (Left) OPEN REDUCTION INTERNAL FIXATION (ORIF) RADIAL FRACTURE (Left) OPEN REDUCTION INTERNAL FIXATION (ORIF) ELBOW/OLECRANON FRACTURE (Left) Plan: Will monitor pt along. Cont in left UE splint Right clavicle is a non operative problem at this point. Spoke with pt's husband today.  [name] 01/11/2014,  4:38 PM
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
## 2014-01-11 NOTE — Progress Notes (Signed)
Removed patient's left chest tube per order. Covered with vasoline gauze and 4X4 gauze. Dressing clean dry and intact. Pt's 02 sats are at 100%. No discomfort noticed. Gilford Rile, RN at bedside. Will continue to monitor.

## 2014-01-11 NOTE — Progress Notes (Signed)
Radiology called re post chest tube removal chest x-ray.  Shows no evidence of pneumothorax or hemothorax. [place], [place]
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
## 2014-01-11 NOTE — Clinical Social Work Note (Signed)
Clinical Social Worker continuing to follow patient and family for support.  CSW spoke with patient mother outside of the room.  Patient mother states that she is caring for patient 3 children while patient is hospitalized.  Patient mother provided copies of patient Medicaid card, Driver's License, and Social Security card, however patient Medicaid is no longer active - financial counselor aware and will follow up with family.    Patient mother also states that when patient is medically stable they are requesting placement at [facility] explained that due to patient current lack of insurance, [facility] will likely not be an option at discharge.  Patient mother explains that a family friend is affiliated with [facility] and is speaking with administration regarding possible placement.  Patient mother will provide CSW with [facility] contact for CM to speak with once patient is medically stable.  Patient mother appreciative of support.  CSW remains available for support and to assist with facilitating patient discharge needs once medically appropriate.  Barbette Or, [place]

## 2014-01-11 NOTE — Progress Notes (Signed)
UR completed.  Note that therapies recommending LTACH.  Pt is not appropriate for LTACH with no available insurance coverage. If her Medicaid is active, she would still be inappropriate for [facility] as they also do not accept Medicaid coverage. I have spoken with the [facility] liaison and she is not aware of any plans for her facility to accept this patient at this time. Will continue to follow.   Sandi Mariscal, RN BSN [place] CCM Trauma/Neuro ICU Case Manager [phone]

## 2014-01-11 NOTE — Progress Notes (Signed)
Patient ID: [name], female   DOB: Jan 01, 1981, 33 y.o.   MRN: [id number] BP 121/69  Pulse 82  Temp(Src) 101.3 F (38.5 C) (Axillary)  Resp 25  Ht 5\' 5"  (1.651 m)  Wt 69.9 kg (154 lb 1.6 oz)  BMI 25.64 kg/m2  SpO2 100% Not responsive Perrl,+cough, +corneals, +gag Flexes knees with lower extremity noxious stimuli Some extension in upper extremities to central noxious stimuli No change in exam. No new recommendations
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
## 2014-01-12 ENCOUNTER — Encounter (HOSPITAL_COMMUNITY): Admission: EM | Disposition: A | Discharge status: Skilled Nursing Facility | Payer: Self-pay | Source: Home / Self Care

## 2014-01-12 ENCOUNTER — Inpatient Hospital Stay (HOSPITAL_COMMUNITY): Payer: No Typology Code available for payment source

## 2014-01-12 DIAGNOSIS — S064XAA Epidural hemorrhage with loss of consciousness status unknown, initial encounter: Secondary | ICD-10-CM | POA: Diagnosis not present

## 2014-01-12 HISTORY — PX: PERCUTANEOUS TRACHEOSTOMY: SHX5288

## 2014-01-12 HISTORY — PX: PEG PLACEMENT: SHX5437

## 2014-01-12 LAB — BASIC METABOLIC PANEL
BUN: 23 mg/dL (ref 6–23)
CALCIUM: 8.8 mg/dL (ref 8.4–10.5)
CHLORIDE: 100 meq/L (ref 96–112)
CO2: 26 meq/L (ref 19–32)
Creatinine, Ser: 0.51 mg/dL (ref 0.50–1.10)
GFR calc Af Amer: >90 mL/min (ref >90)
GFR calc non Af Amer: >90 mL/min (ref >90)
GLUCOSE: 112 mg/dL — AB (ref 70–99)
POTASSIUM: 4.5 meq/L (ref 3.7–5.3)
Sodium: 141 mEq/L (ref 137–147)

## 2014-01-12 LAB — GLUCOSE, CAPILLARY
GLUCOSE-CAPILLARY: 113 mg/dL — AB (ref 70–99)
GLUCOSE-CAPILLARY: 112 mg/dL — AB (ref 70–99)
Glucose-Capillary: 120 mg/dL — ABNORMAL HIGH (ref 70–99)
Glucose-Capillary: 119 mg/dL — ABNORMAL HIGH (ref 70–99)
Glucose-Capillary: 117 mg/dL — ABNORMAL HIGH (ref 70–99)

## 2014-01-12 LAB — CBC
HCT: 29.9 % — ABNORMAL LOW (ref 36.0–46.0)
HEMOGLOBIN: 9.6 g/dL — AB (ref 12.0–15.0)
MCH: 26.7 pg (ref 26.0–34.0)
MCHC: 32.1 g/dL (ref 30.0–36.0)
MCV: 83.3 fL (ref 78.0–100.0)
PLATELETS: 268 10*3/uL (ref 150–400)
RBC: 3.59 MIL/uL — ABNORMAL LOW (ref 3.87–5.11)
RDW: 18.2 % — ABNORMAL HIGH (ref 11.5–15.5)
WBC: 16.3 10*3/uL — AB (ref 4.0–10.5)

## 2014-01-12 SURGERY — CREATION, TRACHEOSTOMY, PERCUTANEOUS
Anesthesia: LOCAL

## 2014-01-12 SURGERY — INSERTION, PEG TUBE

## 2014-01-12 MED ORDER — CIPROFLOXACIN IN D5W 400 MG/200ML IV SOLN
400.0000 mg | INTRAVENOUS | Status: AC
  Administered 2014-01-12: 400 mg via INTRAVENOUS
  Filled 2014-01-12: qty 200

## 2014-01-12 MED ORDER — MIDAZOLAM HCL 2 MG/2ML IJ SOLN
4.0000 mg | Freq: Once | INTRAMUSCULAR | Status: AC
  Administered 2014-01-12: 2 mg via INTRAVENOUS

## 2014-01-12 MED ORDER — FENTANYL CITRATE 0.05 MG/ML IJ SOLN
150.0000 ug | Freq: Once | INTRAMUSCULAR | Status: AC
Start: 2014-01-12 — End: 2014-01-12
  Administered 2014-01-12: 150 ug via INTRAVENOUS

## 2014-01-12 MED ORDER — MIDAZOLAM HCL 2 MG/2ML IJ SOLN
INTRAMUSCULAR | Status: AC
  Administered 2014-01-12: 2 mg
  Filled 2014-01-12: qty 4

## 2014-01-12 MED ORDER — MIDAZOLAM HCL 2 MG/2ML IJ SOLN
6.0000 mg | Freq: Once | INTRAMUSCULAR | Status: AC
  Administered 2014-01-12: 6 mg via INTRAVENOUS

## 2014-01-12 MED ORDER — MORPHINE SULFATE 4 MG/ML IJ SOLN
INTRAMUSCULAR | Status: AC
  Administered 2014-01-12: 4 mg
  Filled 2014-01-12: qty 1

## 2014-01-12 MED ORDER — LIDOCAINE-EPINEPHRINE 1.5 %-1:200,000 OPTIME - NO CHARGE
INTRAMUSCULAR | Status: DC | PRN
  Administered 2014-01-12: 4 mL via SUBCUTANEOUS

## 2014-01-12 MED ORDER — MIDAZOLAM HCL 2 MG/2ML IJ SOLN
INTRAMUSCULAR | Status: AC
  Filled 2014-01-12: qty 2

## 2014-01-12 MED ORDER — MORPHINE SULFATE 4 MG/ML IJ SOLN
4.0000 mg | Freq: Once | INTRAMUSCULAR | Status: AC
  Administered 2014-01-12: 4 mg via INTRAVENOUS

## 2014-01-12 MED ORDER — MIDAZOLAM HCL 2 MG/2ML IJ SOLN
INTRAMUSCULAR | Status: AC
  Administered 2014-01-12: 2 mg via INTRAVENOUS
  Filled 2014-01-12: qty 4

## 2014-01-12 MED ORDER — VECURONIUM BROMIDE 10 MG IV SOLR
INTRAVENOUS | Status: AC
  Administered 2014-01-12: 10 mg
  Filled 2014-01-12: qty 10

## 2014-01-12 MED ORDER — VECURONIUM BROMIDE 10 MG IV SOLR
10.0000 mg | Freq: Once | INTRAVENOUS | Status: AC
  Administered 2014-01-12: 10 mg via INTRAVENOUS
  Filled 2014-01-12: qty 10

## 2014-01-12 SURGICAL SUPPLY — 23 items
DRAPE PROXIMA HALF (DRAPES) ×4 IMPLANT
DRAPE UTILITY 15X26 W/TAPE STR (DRAPE) ×4 IMPLANT
ELECT CAUTERY BLADE 6.4 (BLADE) ×2 IMPLANT
ELECT REM PT RETURN 9FT ADLT (ELECTROSURGICAL) ×2
ELECTRODE REM PT RTRN 9FT ADLT (ELECTROSURGICAL) ×1 IMPLANT
GAUZE SPONGE 4X4 16PLY XRAY LF (GAUZE/BANDAGES/DRESSINGS) ×2 IMPLANT
GLOVE BIO SURGEON STRL SZ8 (GLOVE) ×2 IMPLANT
GLOVE BIOGEL PI IND STRL 8 (GLOVE) ×1 IMPLANT
GLOVE BIOGEL PI INDICATOR 8 (GLOVE) ×1
GLOVE ECLIPSE 7.5 STRL STRAW (GLOVE) ×2 IMPLANT
GOWN PREVENTION PLUS XLARGE (GOWN DISPOSABLE) ×2 IMPLANT
GOWN STRL NON-REIN LRG LVL3 (GOWN DISPOSABLE) ×4 IMPLANT
INTRODUCER TRACH BLUE RHINO 6F (TUBING) ×2 IMPLANT
INTRODUCER TRACH BLUE RHINO 8F (TUBING) IMPLANT
NS IRRIG 1000ML POUR BTL (IV SOLUTION) IMPLANT
PENCIL BUTTON HOLSTER BLD 10FT (ELECTRODE) ×2 IMPLANT
SPONGE DRAIN TRACH 4X4 STRL 2S (GAUZE/BANDAGES/DRESSINGS) ×2 IMPLANT
SPONGE INTESTINAL PEANUT (DISPOSABLE) ×2 IMPLANT
SUT SILK 2 0 SH CR/8 (SUTURE) IMPLANT
SUT VICRYL AB 3 0 TIES (SUTURE) ×2 IMPLANT
TOWEL OR 17X26 10 PK STRL BLUE (TOWEL DISPOSABLE) ×2 IMPLANT
TUBE CONNECTING 12X1/4 (SUCTIONS) ×2 IMPLANT
YANKAUER SUCT BULB TIP NO VENT (SUCTIONS) ×2 IMPLANT

## 2014-01-12 NOTE — Op Note (Signed)
OPERATIVE REPORT  DATE OF OPERATION: 01/12/2014  PATIENT:  Cheri Fowler  33 y.o. female  PRE-OPERATIVE DIAGNOSIS:  respitory failure  POST-OPERATIVE DIAGNOSIS:  Same with TBI  PROCEDURE:  Procedure(s): PERCUTANEOUS ENDOSCOPIC GASTROSTOMY (PEG) PLACEMENT PERCUTANEOUS TRACHEOSTOMY TUBE PLACEMENT  SURGEON:  Surgeon(s): Gwenyth Ober, MD  ASSISTANTJacqulynn Cadet, PA-C  ANESTHESIA:   local and IV sedation  EBL: <20 ml  BLOOD ADMINISTERED: none  DRAINS: Urinary Catheter (Foley), Gastrostomy Tube and Tracheostomy tube   SPECIMEN:  No Specimen  COUNTS CORRECT:  YES  PROCEDURE DETAILS: This patient has a severe traumatic brain injury. She is ventilator dependent and requires chronic long-term nutritional support.  The procedure was performed in the [facility] intensive care unit for trauma. The initial procedure was a percutaneous endoscopic gastrostomy tube. A proper time out was performed identifying the patient and the procedure to be performed. She was positioned properly. Her abdomen was prepped and draped in the usual sterile manner.  The Pentax 2990 endoscope was passed oral pharyngeal he through and into the upper esophagus of the patient with an orogastric tube in place. Once we cannulated the esophagogastric junction the orogastric tube was removed. We passed the endoscope into and through the pylorus into the first and second portion of the duodenum. Pictures were taken along the way up the esophagus the stomach and the duodenum.  Once we had inspected the first and second portion of the duodenum we retracted the endoscope back into the body of the stomach we could see the impulse of the assistance finger on the intra-abdominal wall. It was at that site an incision was made and an angiocatheter was passed under direct vision into the stomach. A looped the blue wire was passed through the angiocatheter into the stomach and subsequently retrieved with the snare through the  endoscope.  The the blue wire was pulled out through the patient's mouth and then attached to the pull-through gastrostomy tube. The wire was then pulled out through the anterior abdominal wall and the pull-through gastrostomy tube brought out through the patient's esophagus then out through the anterior abdominal wall to where the bolster sat up on the gastric surface at the anterior abdominal wall. Pictures were taken of the gastrostomy tube in the proper position. It was subsequently secured in place using a bolster applied with the kit.  We subsequently set up for the percutaneous tracheostomy tube placement. A second time out was performed identifying the patient and the procedure to be performed. We did not hyperextend the patient's neck because of some concern of cervical spine laxity on recent MRI scan.  Her neck was prepped and draped in usual sterile manner. We anesthetized the area of the trachea and approximately 1 cm above the sternal notch. We made a transverse incision using a #15 blade then dissected down to the pretracheal fashion with minimal difficulty. Once we got down to the trachea the endotracheal tube was pulled back just proximal to where the tracheostomy was to be place. We inserted an angiocatheter with an attached syringe of saline into the trachea aspirating air as we got into the lumen of the trachea. Upon doing so we subsequently removed the needle then passed a wire using the Seldinger technique into the lumen of the trachea. A short firm blue tapering dilator was passed over the wire into the trachea making the initial tracheotomy. We subsequently enlarged is using the serial Blue Rhino dilator up to the black line. A tracheal hook was placed along the  cricoid cartilage as we subsequently pass the 24 French introducer and dilator over the guide into the tracheotomy securing it in place in the trachea. We attached the in the cannula and attached the patient to the ventilator  where adequate bines were returned.  We subsequently secured the tracheostomy in place in 4 quadrants with a drain sponge underneath the flanges of the tracheostomy tube . An attempt to bronchoscope the patient through the endotracheal tube was unsuccessful as endotracheal tube and then brought back through the cords.  The patient and subsequently desaturated and a chest x-ray demonstrated complete whiteout of her left lung. This required a bronchoscopy for secretion control which was successful.  All counts were correct including needles, sponges and instruments.  PATIENT DISPOSITION:  Remained in ICU, sedated, stable.  CXR pending.       Gwenyth Ober 2/4/201512:35 PM
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
## 2014-01-12 NOTE — Progress Notes (Signed)
The patient's oxygen saturations dropped shortly after tracheostomy placed.  She had audible breath sounds after the trach, but CXR showed complete white out of the left lung.  Bronchoscopy was performed at the bedside and lots of thick secretions were aspirated from the left lung.  With recruitment maneuvers, her oxygen saturations increased to upper 90% range.  She has good breath sounds on the left still, and a repeat CXR is pending.  Kathryne Eriksson. Dahlia Bailiff, MD, [place] [phone] Trauma Surgeon

## 2014-01-12 NOTE — Progress Notes (Signed)
Pt HR is 120s-130s. Pt did not get morning or lunch dose of propanolol d/t scheduled bedside procedure. Called pharmacy to see if 1800 dose could be given early d/t elevated HR they agreed that it should be given now and next dose given as scheduled.
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
## 2014-01-12 NOTE — Procedures (Signed)
Bronchoscopy Procedure Note Skyeler Scalese [id number] 1981-02-19  Procedure: Bronchoscopy Indications: Remove secretions and COmplete white out of left lung after tracheostomy  Procedure Details Consent: Risks of procedure as well as the alternatives and risks of each were explained to the (patient/caregiver).  Consent for procedure obtained. Time Out: Verified patient identification, verified procedure, site/side was marked, verified correct patient position, special equipment/implants available, medications/allergies/relevent history reviewed, required imaging and test results available.  Performed  In preparation for procedure, patient was given 100% FiO2 and bronchoscope lubricated. Sedation: Short-acting barbiturates and fentanyl  Airway entered and the following bronchi were examined: LUL, LLL and Bronchi.   Procedures performed: Brushings performed Bronchoscope removed.    Evaluation Hemodynamic Status: BP stable throughout; O2 sats: transiently fell during during procedure and improved significantly at the end Patient's Current Condition: stable Specimens:  None Complications: No apparent complications Patient did tolerate procedure well. Much improve oxygenation after the procedure.  Gwenyth Ober 01/12/2014

## 2014-01-12 NOTE — Progress Notes (Addendum)
Patient ID: [name], female   DOB: 14-Jan-1981, 33 y.o.   MRN: [id number] BP 122/77  Pulse 127  Temp(Src) 100.6 F (38.1 C) (Axillary)  Resp 22  Ht 5\' 5"  (1.651 m)  Wt 68.1 kg (150 lb 2.1 oz)  BMI 24.98 kg/m2  SpO2 93% Not responsive Perrl, +cough, +gag,+corneals No change in neuro exam Trached Exam stable

## 2014-01-12 NOTE — Progress Notes (Signed)
OT Note Pt seen for JFK with Tbi team. OT eval to follow. [facility], OTR/L  [phone] 01/12/2014

## 2014-01-12 NOTE — Progress Notes (Signed)
**Note De-identified  Obfuscation** Sputum collected and sent to lab 

## 2014-01-13 ENCOUNTER — Inpatient Hospital Stay (HOSPITAL_COMMUNITY): Payer: No Typology Code available for payment source

## 2014-01-13 ENCOUNTER — Encounter (HOSPITAL_COMMUNITY): Payer: Self-pay | Admitting: General Surgery

## 2014-01-13 DIAGNOSIS — S2242XA Multiple fractures of ribs, left side, initial encounter for closed fracture: Secondary | ICD-10-CM | POA: Diagnosis present

## 2014-01-13 DIAGNOSIS — J9819 Other pulmonary collapse: Secondary | ICD-10-CM

## 2014-01-13 DIAGNOSIS — S064XAA Epidural hemorrhage with loss of consciousness status unknown, initial encounter: Secondary | ICD-10-CM | POA: Diagnosis not present

## 2014-01-13 DIAGNOSIS — S0181XA Laceration without foreign body of other part of head, initial encounter: Secondary | ICD-10-CM | POA: Diagnosis present

## 2014-01-13 DIAGNOSIS — S0285XA Fracture of orbit, unspecified, initial encounter for closed fracture: Secondary | ICD-10-CM | POA: Diagnosis present

## 2014-01-13 DIAGNOSIS — D62 Acute posthemorrhagic anemia: Secondary | ICD-10-CM | POA: Diagnosis not present

## 2014-01-13 DIAGNOSIS — E870 Hyperosmolality and hypernatremia: Secondary | ICD-10-CM | POA: Diagnosis not present

## 2014-01-13 DIAGNOSIS — S22009A Unspecified fracture of unspecified thoracic vertebra, initial encounter for closed fracture: Secondary | ICD-10-CM | POA: Diagnosis present

## 2014-01-13 DIAGNOSIS — S22008A Other fracture of unspecified thoracic vertebra, initial encounter for closed fracture: Secondary | ICD-10-CM | POA: Diagnosis present

## 2014-01-13 DIAGNOSIS — R739 Hyperglycemia, unspecified: Secondary | ICD-10-CM | POA: Diagnosis not present

## 2014-01-13 DIAGNOSIS — S272XXA Traumatic hemopneumothorax, initial encounter: Secondary | ICD-10-CM | POA: Diagnosis present

## 2014-01-13 DIAGNOSIS — J9600 Acute respiratory failure, unspecified whether with hypoxia or hypercapnia: Secondary | ICD-10-CM | POA: Diagnosis present

## 2014-01-13 DIAGNOSIS — S36039A Unspecified laceration of spleen, initial encounter: Secondary | ICD-10-CM | POA: Diagnosis present

## 2014-01-13 DIAGNOSIS — S0100XA Unspecified open wound of scalp, initial encounter: Secondary | ICD-10-CM

## 2014-01-13 LAB — BASIC METABOLIC PANEL
BUN: 24 mg/dL — ABNORMAL HIGH (ref 6–23)
CO2: 28 mEq/L (ref 19–32)
Calcium: 9.1 mg/dL (ref 8.4–10.5)
Chloride: 101 mEq/L (ref 96–112)
Creatinine, Ser: 0.51 mg/dL (ref 0.50–1.10)
GFR calc Af Amer: >90 mL/min (ref >90)
GFR calc non Af Amer: >90 mL/min (ref >90)
Glucose, Bld: 128 mg/dL — ABNORMAL HIGH (ref 70–99)
Potassium: 4.3 mEq/L (ref 3.7–5.3)
Sodium: 144 mEq/L (ref 137–147)

## 2014-01-13 LAB — GLUCOSE, CAPILLARY
GLUCOSE-CAPILLARY: 109 mg/dL — AB (ref 70–99)
GLUCOSE-CAPILLARY: 116 mg/dL — AB (ref 70–99)
GLUCOSE-CAPILLARY: 146 mg/dL — AB (ref 70–99)
Glucose-Capillary: 119 mg/dL — ABNORMAL HIGH (ref 70–99)
Glucose-Capillary: 139 mg/dL — ABNORMAL HIGH (ref 70–99)

## 2014-01-13 LAB — CBC
HCT: 31.4 % — ABNORMAL LOW (ref 36.0–46.0)
Hemoglobin: 10.2 g/dL — ABNORMAL LOW (ref 12.0–15.0)
MCH: 27.1 pg (ref 26.0–34.0)
MCHC: 32.5 g/dL (ref 30.0–36.0)
MCV: 83.5 fL (ref 78.0–100.0)
PLATELETS: 360 10*3/uL (ref 150–400)
RBC: 3.76 MIL/uL — ABNORMAL LOW (ref 3.87–5.11)
RDW: 18.4 % — ABNORMAL HIGH (ref 11.5–15.5)
WBC: 19.1 10*3/uL — ABNORMAL HIGH (ref 4.0–10.5)

## 2014-01-13 MED ORDER — FREE WATER
200.0000 mL | Freq: Three times a day (TID) | Status: DC
  Administered 2014-01-13 – 2014-01-14 (×3): 200 mL

## 2014-01-13 MED ORDER — PIVOT 1.5 CAL PO LIQD
1000.0000 mL | ORAL | Status: DC
  Administered 2014-01-14 – 2014-01-21 (×7): 1000 mL
  Filled 2014-01-13 (×18): qty 1000

## 2014-01-13 MED ORDER — IPRATROPIUM-ALBUTEROL 0.5-2.5 (3) MG/3ML IN SOLN
3.0000 mL | Freq: Four times a day (QID) | RESPIRATORY_TRACT | Status: DC
  Administered 2014-01-13 – 2014-01-19 (×27): 3 mL via RESPIRATORY_TRACT
  Filled 2014-01-13 (×27): qty 3

## 2014-01-13 MED ORDER — SODIUM CHLORIDE 0.45 % IV SOLN
INTRAVENOUS | Status: DC
  Administered 2014-01-13 – 2014-01-14 (×3): via INTRAVENOUS
  Filled 2014-01-13 (×4): qty 1000
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
## 2014-01-13 NOTE — Progress Notes (Signed)
Patient ID: [name], female   DOB: 12/26/1980, 33 y.o.   MRN: [id number] BP 123/79  Pulse 111  Temp(Src) 100.6 F (38.1 C) (Axillary)  Resp 30  Ht 5\' 5"  (1.651 m)  Wt 66.1 kg (145 lb 11.6 oz)  BMI 24.25 kg/m2  SpO2 100% Not responsive perrl +cough, +corneals, +gag Flexes lower extremities to noxious stimuli No neurologic changes.
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
## 2014-01-13 NOTE — Progress Notes (Signed)
While bathing patient and cleaning her hair discovered large open wound on left temporal area . Notified Dr. Grandville Silos . Dr. Grandville Silos assessed wound , cleaned area and stapled wound. Edges approximated.
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
## 2014-01-13 NOTE — Op Note (Signed)
During bathing, RN noted a L scalp lac over her ear - 4cm  Pre-procedure/post procedire diagnosis: 4cm L scalp laceration Procedure: irrigation and simple closure 4cm L scalp laceration Surgeon: Georganna Skeans E Procedure: Her hair was trimmed. Area was prepped in a sterile fashion. Wound was washed out with betadine solution. Laceration closed in a simple fashion with staples. She tolerated it well. Georganna Skeans, MD, MPH, FACS Pager: [phone]

## 2014-01-13 NOTE — Progress Notes (Signed)
Chaplain did follow up with family per weekly ministry rounding.  Family was in good spirits considering the progression of the pt to be awake and off the vent.  Father of pt and chaplain talked about the pt's progress and recovery outlook.  Within that context we discussed plans of care, moving forward.  Father was going home and fiancee was keeping vigil at pt's bedside.  They seemed grateful for the follow up.
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
## 2014-01-14 ENCOUNTER — Inpatient Hospital Stay (HOSPITAL_COMMUNITY): Payer: No Typology Code available for payment source

## 2014-01-14 DIAGNOSIS — D72829 Elevated white blood cell count, unspecified: Secondary | ICD-10-CM

## 2014-01-14 DIAGNOSIS — R509 Fever, unspecified: Secondary | ICD-10-CM

## 2014-01-14 DIAGNOSIS — D62 Acute posthemorrhagic anemia: Secondary | ICD-10-CM

## 2014-01-14 LAB — GLUCOSE, CAPILLARY
GLUCOSE-CAPILLARY: 118 mg/dL — AB (ref 70–99)
GLUCOSE-CAPILLARY: 135 mg/dL — AB (ref 70–99)
GLUCOSE-CAPILLARY: 123 mg/dL — AB (ref 70–99)
GLUCOSE-CAPILLARY: 135 mg/dL — AB (ref 70–99)
Glucose-Capillary: 126 mg/dL — ABNORMAL HIGH (ref 70–99)
Glucose-Capillary: 123 mg/dL — ABNORMAL HIGH (ref 70–99)

## 2014-01-14 LAB — CULTURE, RESPIRATORY: SPECIAL REQUESTS: NORMAL

## 2014-01-14 LAB — BASIC METABOLIC PANEL
BUN: 31 mg/dL — ABNORMAL HIGH (ref 6–23)
CO2: 28 meq/L (ref 19–32)
CREATININE: 0.54 mg/dL (ref 0.50–1.10)
Calcium: 9 mg/dL (ref 8.4–10.5)
Chloride: 104 mEq/L (ref 96–112)
GFR calc non Af Amer: >90 mL/min (ref >90)
Glucose, Bld: 140 mg/dL — ABNORMAL HIGH (ref 70–99)
Potassium: 4.2 mEq/L (ref 3.7–5.3)
Sodium: 145 mEq/L (ref 137–147)

## 2014-01-14 LAB — CBC
HEMATOCRIT: 31.5 % — AB (ref 36.0–46.0)
Hemoglobin: 10 g/dL — ABNORMAL LOW (ref 12.0–15.0)
MCH: 27.3 pg (ref 26.0–34.0)
MCHC: 31.7 g/dL (ref 30.0–36.0)
MCV: 86.1 fL (ref 78.0–100.0)
PLATELETS: 412 10*3/uL — AB (ref 150–400)
RBC: 3.66 MIL/uL — ABNORMAL LOW (ref 3.87–5.11)
RDW: 19 % — AB (ref 11.5–15.5)
WBC: 23.8 10*3/uL — ABNORMAL HIGH (ref 4.0–10.5)

## 2014-01-14 LAB — CULTURE, RESPIRATORY W GRAM STAIN

## 2014-01-14 MED ORDER — SODIUM CHLORIDE 0.9 % IJ SOLN: 10.0000 mL | INTRAMUSCULAR | Status: DC | PRN

## 2014-01-14 MED ORDER — SODIUM CHLORIDE 0.9 % IJ SOLN
10.0000 mL | Freq: Two times a day (BID) | INTRAMUSCULAR | Status: DC
  Administered 2014-01-14 – 2014-01-17 (×6): 10 mL

## 2014-01-14 MED ORDER — PANTOPRAZOLE SODIUM 40 MG PO PACK
40.0000 mg | PACK | Freq: Every day | ORAL | Status: DC
  Administered 2014-01-15 – 2014-02-07 (×23): 40 mg
  Filled 2014-01-14 (×25): qty 20

## 2014-01-14 MED ORDER — FREE WATER
200.0000 mL | Freq: Four times a day (QID) | Status: DC
  Administered 2014-01-14 – 2014-01-24 (×36): 200 mL

## 2014-01-14 NOTE — Progress Notes (Signed)
UR completed.  Pt not a candidate for LTACH due to lack of insurance coverage. Pt with new trach.  Likely to be vent SNF vs local SNF with letter of guarantee from health system when medically stable.   Sandi Mariscal, RN BSN [place] CCM Trauma/Neuro ICU Case Manager [phone]

## 2014-01-14 NOTE — Progress Notes (Signed)
Peripherally Inserted Central Catheter/Midline Placement  The IV Nurse has discussed with the patient and/or persons authorized to consent for the patient, the purpose of this procedure and the potential benefits and risks involved with this procedure.  The benefits include less needle sticks, lab draws from the catheter and patient may be discharged home with the catheter.  Risks include, but not limited to, infection, bleeding, blood clot (thrombus formation), and puncture of an artery; nerve damage and irregular heat beat.  Alternatives to this procedure were also discussed.  PICC/Midline Placement Documentation        [name] 01/14/2014, 6:57 PM

## 2014-01-14 NOTE — Clinical Social Work Note (Signed)
Clinical Social Worker continuing to follow patient and family for support and discharge planning needs.  CSW met with patient mother at bedside and spoke with patient father over the phone to further discuss patient needs.  Patient mother plans to communicate with financial counselor regarding the opportunity to reinstate patient Medicaid.  Per CM note, "Pt not a candidate for LTACH due to lack of insurance coverage."  Patient has progressed to 28% trach collar today - CSW to begin the process of LOG SNF placement with family if patient is able to maintain trach collar at 28% throughout the weekend.  Patient family remains appreciative of support but continues to question the possibility of [facility] at discharge.  CSW to communicate with CM regarding most effective communication with patient family.  CSW remains available for support and to facilitate patient discharge needs once medically stable.  Jesse , LCSW [phone]   

## 2014-01-14 NOTE — Progress Notes (Signed)
Attempted right upper arm PICC insertion. Unable to advance PICC pass right clavicle fracture. No complications noted. ICU RN aware. Referred to IR.

## 2014-01-14 NOTE — Progress Notes (Addendum)
Follow up - Trauma and Critical Care  Patient Details:    [name] is an 33 y.o. female.  Lines/tubes : CVC Triple Lumen 01/05/14 Left Subclavian (Active)  Indication for Insertion or Continuance of Line Prolonged intravenous therapies 01/13/2014  8:00 PM  Site Assessment Clean;Dry;Intact 01/13/2014  8:00 PM  Proximal Lumen Status Infusing 01/13/2014  8:00 PM  Medial Saline locked 01/13/2014  8:00 PM  Distal Lumen Status Saline locked 01/13/2014  8:00 PM  Dressing Type Transparent 01/13/2014  8:00 PM  Dressing Status Clean;Dry;Intact;Antimicrobial disc in place 01/13/2014  8:00 PM  Line Care Connections checked and tightened 01/13/2014  8:00 PM  Dressing Intervention Dressing changed;Antimicrobial disc changed 01/12/2014  5:00 AM  Dressing Change Due 01/19/14 01/13/2014  8:00 PM     Gastrostomy/Enterostomy PEG-jejunostomy RLQ (Active)  Surrounding Skin Dry;Intact 01/13/2014  8:00 PM  Tube Status Patent 01/13/2014  8:00 PM  Drainage Appearance None 01/13/2014  8:00 PM  Dressing Status Clean;Dry;Intact 01/13/2014  8:00 PM  Gastric Residual 0 mL 01/14/2014 12:00 AM  Output (mL) 450 mL 01/13/2014  6:00 AM     Urethral Catheter k hyatt rn Non-latex 16 Fr. (Active)  Indication for Insertion or Continuance of Catheter Unstable spinal/crush injuries 01/13/2014  8:00 PM  Site Assessment Clean;Intact 01/13/2014  8:00 PM  Catheter Maintenance Bag below level of bladder 01/13/2014  8:00 PM  Collection Container Standard drainage bag 01/13/2014  8:00 PM  Securement Method Leg strap 01/13/2014  8:00 PM  Urinary Catheter Interventions Unclamped 01/13/2014  8:00 PM  Output (mL) 120 mL 01/14/2014  5:00 AM    Microbiology/Sepsis markers: Results for orders placed during the hospital encounter of 01/04/14  MRSA PCR SCREENING     Status: None   Collection Time    01/04/14  7:01 PM      Result Value Range Status   MRSA by PCR NEGATIVE  NEGATIVE Final   Comment:            The GeneXpert MRSA Assay (FDA     approved for NASAL  specimens     only), is one component of a     comprehensive MRSA colonization     surveillance program. It is not     intended to diagnose MRSA     infection nor to guide or     monitor treatment for     MRSA infections.  URINE CULTURE     Status: None   Collection Time    01/08/14 11:36 AM      Result Value Range Status   Specimen Description URINE, CATHETERIZED   Final   Special Requests NONE   Final   Culture  Setup Time     Final   Value: 01/08/2014 18:13     Performed at [place]     Final   Value: NO GROWTH     Performed at [facility]   Culture     Final   Value: NO GROWTH     Performed at [facility]   Report Status 01/09/2014 FINAL   Final  CULTURE, BLOOD (ROUTINE X 2)     Status: None   Collection Time    01/09/14  7:53 AM      Result Value Range Status   Specimen Description BLOOD RIGHT ARM   Final   Special Requests BOTTLES DRAWN AEROBIC AND ANAEROBIC 10CC   Final   Culture  Setup Time     Final  Value: 01/09/2014 20:05     Performed at [facility]   Culture     Final   Value:        BLOOD CULTURE RECEIVED NO GROWTH TO DATE CULTURE WILL BE HELD FOR 5 DAYS BEFORE ISSUING A FINAL NEGATIVE REPORT     Performed at [facility]   Report Status PENDING   Incomplete  CULTURE, BLOOD (ROUTINE X 2)     Status: None   Collection Time    01/09/14  7:58 AM      Result Value Range Status   Specimen Description BLOOD RIGHT ARM   Final   Special Requests BOTTLES DRAWN AEROBIC AND ANAEROBIC 10CC   Final   Culture  Setup Time     Final   Value: 01/09/2014 20:05     Performed at [facility]   Culture     Final   Value:        BLOOD CULTURE RECEIVED NO GROWTH TO DATE CULTURE WILL BE HELD FOR 5 DAYS BEFORE ISSUING A FINAL NEGATIVE REPORT     Performed at [facility]   Report Status PENDING   Incomplete  CULTURE, RESPIRATORY (NON-EXPECTORATED)     Status: None   Collection Time    01/12/14   8:00 AM      Result Value Range Status   Specimen Description TRACHEAL ASPIRATE   Final   Special Requests Normal   Final   Gram Stain     Final   Value: RARE WBC PRESENT, PREDOMINANTLY PMN     RARE SQUAMOUS EPITHELIAL CELLS PRESENT     FEW GRAM NEGATIVE RODS     RARE GRAM POSITIVE COCCI     IN PAIRS   Culture     Final   Value: Non-Pathogenic Oropharyngeal-type Flora Isolated.     Performed at [facility]   Report Status 01/14/2014 FINAL   Final    Anti-infectives:  Anti-infectives   Start     Dose/Rate Route Frequency Ordered Stop   01/12/14 0800  ciprofloxacin (CIPRO) IVPB 400 mg     400 mg 200 mL/hr over 60 Minutes Intravenous On call 01/12/14 0748 01/12/14 0924   01/05/14 1400  clindamycin (CLEOCIN) IVPB 600 mg     600 mg 100 mL/hr over 30 Minutes Intravenous 3 times per day 01/05/14 0855 01/06/14 2138   01/05/14 1100  gentamicin (GARAMYCIN) IVPB 60 mg     60 mg 100 mL/hr over 30 Minutes Intravenous Every 8 hours 01/05/14 0947 01/07/14 0251   01/05/14 0600  clindamycin (CLEOCIN) IVPB 300 mg  Status:  Discontinued     300 mg 100 mL/hr over 30 Minutes Intravenous 3 times per day 01/05/14 0332 01/05/14 0855   01/04/14 2115  clindamycin (CLEOCIN) IVPB 600 mg  Status:  Discontinued     600 mg 100 mL/hr over 30 Minutes Intravenous  Once 01/04/14 2104 01/07/14 0841   01/04/14 2108  clindamycin (CLEOCIN) 600 MG/50ML IVPB    Comments:  Haynes Bast   : cabinet override      01/04/14 2108 01/04/14 2120      Best Practice/Protocols:  VTE Prophylaxis: Mechanical GI Prophylaxis: Proton Pump Inhibitor Intermittent Sedation  Consults: Treatment Team:  Winfield Cunas, MD    Events:  Subjective:    Overnight Issues: Patient on trach collar this morning.  Seems very comfortable.  Objective:  Vital signs for last 24 hours: Temp:  [98.7 F (37.1 C)-101.6 F (38.7 C)] 99.4  F (37.4 C) (02/06 0328) Pulse Rate:  [89-134] 90 (02/06 0759) Resp:  [15-37] 16  (02/06 0759) BP: (103-138)/(59-100) 138/92 mmHg (02/06 0600) SpO2:  [90 %-100 %] 99 % (02/06 0815) FiO2 (%):  [35 %-80 %] 35 % (02/06 0815) Weight:  [66.6 kg (146 lb 13.2 oz)] 66.6 kg (146 lb 13.2 oz) (02/06 0500)  Hemodynamic parameters for last 24 hours:    Intake/Output from previous day: 02/05 0701 - 02/06 0700 In: 2565 [I.V.:1200; NG/GT:1365] Out: 1470 [Urine:1470]  Intake/Output this shift:    Vent settings for last 24 hours: Vent Mode:  [-] PRVC FiO2 (%):  [35 %-80 %] 35 % Set Rate:  [15 bmp] 15 bmp Vt Set:  [500 mL] 500 mL PEEP:  [5 cmH20] 5 cmH20 Plateau Pressure:  [18 [id number] cmH20] 18 cmH20  Physical Exam:  General: no respiratory distress and Not responsive. Neuro: nonfocal exam and RASS -2 Resp: clear to auscultation bilaterally CVS: regular rate and rhythm, S1, S2 normal, no murmur, click, rub or gallop GI: soft, nontender, BS WNL, no r/g and tolerating tube feedings well Extremities: no edema, no erythema, pulses WNL and Has PAS hose on.  Splints in place  Results for orders placed during the hospital encounter of 01/04/14 (from the past 24 hour(s))  GLUCOSE, CAPILLARY     Status: Abnormal   Collection Time    01/13/14 12:01 PM      Result Value Range   Glucose-Capillary 119 (*) 70 - 99 mg/dL  GLUCOSE, CAPILLARY     Status: Abnormal   Collection Time    01/13/14  3:48 PM      Result Value Range   Glucose-Capillary 139 (*) 70 - 99 mg/dL   Comment 1 Notify RN     Comment 2 Documented in Chart    GLUCOSE, CAPILLARY     Status: Abnormal   Collection Time    01/13/14  8:22 PM      Result Value Range   Glucose-Capillary 118 (*) 70 - 99 mg/dL   Comment 1 Notify RN     Comment 2 Documented in Chart    GLUCOSE, CAPILLARY     Status: Abnormal   Collection Time    01/13/14 11:31 PM      Result Value Range   Glucose-Capillary 146 (*) 70 - 99 mg/dL   Comment 1 Documented in Chart     Comment 2 Notify RN    GLUCOSE, CAPILLARY     Status: Abnormal    Collection Time    01/14/14  3:27 AM      Result Value Range   Glucose-Capillary 126 (*) 70 - 99 mg/dL  CBC     Status: Abnormal   Collection Time    01/14/14  5:45 AM      Result Value Range   WBC 23.8 (*) 4.0 - 10.5 K/uL   RBC 3.66 (*) 3.87 - 5.11 MIL/uL   Hemoglobin 10.0 (*) 12.0 - 15.0 g/dL   HCT 31.5 (*) 36.0 - 46.0 %   MCV 86.1  78.0 - 100.0 fL   MCH 27.3  26.0 - 34.0 pg   MCHC 31.7  30.0 - 36.0 g/dL   RDW 19.0 (*) 11.5 - 15.5 %   Platelets 412 (*) 150 - 400 K/uL  BASIC METABOLIC PANEL     Status: Abnormal   Collection Time    01/14/14  5:45 AM      Result Value Range   Sodium 145  137 - 147  mEq/L   Potassium 4.2  3.7 - 5.3 mEq/L   Chloride 104  96 - 112 mEq/L   CO2 28  19 - 32 mEq/L   Glucose, Bld 140 (*) 70 - 99 mg/dL   BUN 31 (*) 6 - 23 mg/dL   Creatinine, Ser 0.54  0.50 - 1.10 mg/dL   Calcium 9.0  8.4 - 10.5 mg/dL   GFR calc non Af Amer >90  >90 mL/min   GFR calc Af Amer >90  >90 mL/min  GLUCOSE, CAPILLARY     Status: Abnormal   Collection Time    01/14/14  8:19 AM      Result Value Range   Glucose-Capillary 135 (*) 70 - 99 mg/dL     Assessment/Plan:   NEURO  Altered Mental Status:  coma   Plan: Allow the patient to awaken with minimal sedation.  PULM  Atelectasis/collapse (focal and LLL)   Plan: CPM.  Suction with bronchodilators  CARDIO  Sinus Tachycardia   Plan: No specific treatment  RENAL  No specific issues   Plan: CPM  GI  PEG in place   Plan: Tolerating tube feedings  ID  WBC up to 23K.  All cultures are negative.  Perhaps has LLL consolidation that is not well seen on CXR  Tracheal aspirate is negative for infection.   Plan: Consider empiric antibiotics.  Repeat labs tomorrow.  HEME  Anemia acute blood loss anemia and anemia of critical illness)   Plan: No blood for now.  ENDO No specific issues.   Plan: CPM  Global Issues  Etiology of leukocytosis is unknown, but patient's BUN is elevated and the patient seems to be on the dry side.   She is not on empiric antibiotics at this time.  She has a left subclavian line in place that could be a potential source of infection.  Will DC and get PICC line in place.    LOS: 10 days   Additional comments:I reviewed the patient's new clinical lab test results. cbc/bmet and I reviewed the patients new imaging test results. cxr  Critical Care Total Time*: 30 Minutes  Rosy Estabrook O 01/14/2014  *Care during the described time interval was provided by me and/or other providers on the critical care team.  I have reviewed this patient's available data, including medical history, events of note, physical examination and test results as part of my evaluation.

## 2014-01-15 ENCOUNTER — Inpatient Hospital Stay (HOSPITAL_COMMUNITY): Payer: No Typology Code available for payment source

## 2014-01-15 LAB — CBC WITH DIFFERENTIAL/PLATELET
BASOS ABS: 0 10*3/uL (ref 0.0–0.1)
BASOS PCT: 0 % (ref 0–1)
EOS PCT: 2 % (ref 0–5)
Eosinophils Absolute: 0.5 10*3/uL (ref 0.0–0.7)
HEMATOCRIT: 31.3 % — AB (ref 36.0–46.0)
Hemoglobin: 9.9 g/dL — ABNORMAL LOW (ref 12.0–15.0)
Lymphocytes Relative: 7 % — ABNORMAL LOW (ref 12–46)
Lymphs Abs: 1.4 10*3/uL (ref 0.7–4.0)
MCH: 27.3 pg (ref 26.0–34.0)
MCHC: 31.6 g/dL (ref 30.0–36.0)
MCV: 86.5 fL (ref 78.0–100.0)
Monocytes Absolute: 1.5 10*3/uL — ABNORMAL HIGH (ref 0.1–1.0)
Monocytes Relative: 7 % (ref 3–12)
Neutro Abs: 18 10*3/uL — ABNORMAL HIGH (ref 1.7–7.7)
Neutrophils Relative %: 84 % — ABNORMAL HIGH (ref 43–77)
Platelets: 461 10*3/uL — ABNORMAL HIGH (ref 150–400)
RBC: 3.62 MIL/uL — ABNORMAL LOW (ref 3.87–5.11)
RDW: 18.9 % — AB (ref 11.5–15.5)
WBC: 21.5 10*3/uL — ABNORMAL HIGH (ref 4.0–10.5)

## 2014-01-15 LAB — GLUCOSE, CAPILLARY
GLUCOSE-CAPILLARY: 131 mg/dL — AB (ref 70–99)
GLUCOSE-CAPILLARY: 131 mg/dL — AB (ref 70–99)
GLUCOSE-CAPILLARY: 115 mg/dL — AB (ref 70–99)
Glucose-Capillary: 119 mg/dL — ABNORMAL HIGH (ref 70–99)
Glucose-Capillary: 119 mg/dL — ABNORMAL HIGH (ref 70–99)

## 2014-01-15 LAB — BASIC METABOLIC PANEL
BUN: 31 mg/dL — ABNORMAL HIGH (ref 6–23)
CO2: 28 mEq/L (ref 19–32)
CREATININE: 0.5 mg/dL (ref 0.50–1.10)
Calcium: 9.2 mg/dL (ref 8.4–10.5)
Chloride: 103 mEq/L (ref 96–112)
GFR calc non Af Amer: >90 mL/min (ref >90)
Glucose, Bld: 116 mg/dL — ABNORMAL HIGH (ref 70–99)
Potassium: 4.5 mEq/L (ref 3.7–5.3)
Sodium: 143 mEq/L (ref 137–147)

## 2014-01-15 LAB — CULTURE, BLOOD (ROUTINE X 2)
CULTURE: NO GROWTH
Culture: NO GROWTH

## 2014-01-15 MED ORDER — SODIUM CHLORIDE 0.45 % IV SOLN
INTRAVENOUS | Status: DC
  Administered 2014-01-15: 17:00:00 via INTRAVENOUS

## 2014-01-15 MED ORDER — DOCUSATE SODIUM 50 MG/5ML PO LIQD
200.0000 mg | Freq: Two times a day (BID) | ORAL | Status: DC
  Administered 2014-01-15 – 2014-01-20 (×7): 200 mg
  Filled 2014-01-15 (×12): qty 20

## 2014-01-15 NOTE — Progress Notes (Signed)
Subjective: Patient reports On trach collar, minimal responsiveness.  Objective: Vital signs in last 24 hours: Temp:  [98.2 F (36.8 C)-99.4 F (37.4 C)] 99.3 F (37.4 C) (02/07 0323) Pulse Rate:  [76-126] 126 (02/07 0850) Resp:  [15-31] 24 (02/07 0850) BP: (111-128)/(66-80) 114/71 mmHg (02/07 0600) SpO2:  [93 %-100 %] 100 % (02/07 0850) FiO2 (%):  [28 %] 28 % (02/07 0850) Weight:  [65.1 kg (143 lb 8.3 oz)] 65.1 kg (143 lb 8.3 oz) (02/07 0316)  Intake/Output from previous day: 02/06 0701 - 02/07 0700 In: 2530 [I.V.:1210; NG/GT:1320] Out: 1650 [Urine:1650] Intake/Output this shift: Total I/O In: -  Out: 175 [Urine:175]  On trach collar, minimal responsiveness. Pupils small reactive.  Lab Results:  Recent Labs  01/14/14 0545 01/15/14 0315  WBC 23.8* 21.5*  HGB 10.0* 9.9*  HCT 31.5* 31.3*  PLT 412* 461*   BMET  Recent Labs  01/14/14 0545 01/15/14 0315  NA 145 143  K 4.2 4.5  CL 104 103  CO2 28 28  GLUCOSE 140* 116*  BUN 31* 31*  CREATININE 0.54 0.50  CALCIUM 9.0 9.2    Studies/Results: Dg Chest Port 1 View  01/15/2014   CLINICAL DATA:  Followup atelectatic changes  EXAM: PORTABLE CHEST - 1 VIEW  COMPARISON:  01/14/2014  FINDINGS: Cardiac shadow is stable. A left central venous line and endotracheal tube are again identified and stable. Mild left basilar atelectasis is again seen. No new focal abnormality is noted.  IMPRESSION: No change from the previous exam.   Electronically Signed   By: Inez Catalina M.D.   On: 01/15/2014 07:13   Dg Chest Port 1 View  01/14/2014   CLINICAL DATA:  Respiratory failure .  EXAM: PORTABLE CHEST - 1 VIEW  COMPARISON:  DG CHEST 1V PORT dated 01/13/2014; DG CHEST 1V PORT dated 01/12/2014  FINDINGS: Mediastinum and hilar structures are normal. Tracheostomy tube noted good anatomic position. Left subclavian line in good anatomic position. Mild left upper lobe infiltrate cannot be excluded. Mild subsegmental atelectasis left lung base. No  pneumothorax. Heart size normal. Right clavicular fracture.  IMPRESSION: 1. Mild infiltrate left upper lobe. 2. Mild subsegmental atelectasis left lung base. 3. Tracheostomy tube and central line in good anatomic position. 4. Right clavicular fracture.   Electronically Signed   By: Marcello Moores  Register   On: 01/14/2014 07:42    Assessment/Plan: Closed head injury with diffuse petechial injury.  LOS: 11 days   slow but hopefully steady recovery plan   Alisse Tuite J 01/15/2014, 11:21 AM

## 2014-01-15 NOTE — Progress Notes (Signed)
RT assessed pt. During first rounds. RT noticed pt. Was tolerating trache collar well. Pt. Respiratory rate fluctuated between 25 and 31 without pt. Distress. Pt. Breathing was regular, with sats in the high 90's. RN was concerned about pt. Being a little tachypneic, but pt. Continued to tolerate without any issues. RT asked charge RT to assess pt. And charge RT stated pt. Was fine on trache collar with bilateral air movement throughout lung fields. RT will continue to monitor pt.

## 2014-01-15 NOTE — Progress Notes (Signed)
Follow up - Trauma and Critical Care  Patient Details:    [name] is an 33 y.o. female.  Lines/tubes : CVC Triple Lumen 01/05/14 Left Subclavian (Active)  Indication for Insertion or Continuance of Line Prolonged intravenous therapies 01/14/2014  9:00 PM  Site Assessment Clean;Dry;Intact 01/14/2014  9:00 PM  Proximal Lumen Status Infusing;Capped (Central line) 01/14/2014  9:00 PM  Medial Flushed;Saline locked;Capped (Central line) 01/14/2014  9:00 PM  Distal Lumen Status Flushed;Saline locked;Capped (Central line) 01/14/2014  9:00 PM  Dressing Type Transparent 01/14/2014  9:00 PM  Dressing Status Clean;Dry;Intact;Antimicrobial disc in place 01/14/2014  9:00 PM  Line Care Connections checked and tightened 01/14/2014  9:00 PM  Dressing Intervention Dressing changed;Antimicrobial disc changed 01/12/2014  5:00 AM  Dressing Change Due 01/19/14 01/14/2014  9:00 PM     Gastrostomy/Enterostomy PEG-jejunostomy RLQ (Active)  Surrounding Skin Dry;Intact 01/14/2014  8:00 PM  Tube Status Patent 01/14/2014  8:00 PM  Drainage Appearance None 01/14/2014  8:00 PM  Dressing Status Clean;Dry;Intact 01/14/2014  8:00 PM  Dressing Intervention New dressing 01/14/2014  8:00 PM  Dressing Type Split gauze 01/14/2014  8:00 PM  Gastric Residual 0 mL 01/15/2014  4:00 AM  Output (mL) 450 mL 01/13/2014  6:00 AM     Urethral Catheter k hyatt rn Non-latex 16 Fr. (Active)  Indication for Insertion or Continuance of Catheter Other (comment) 01/14/2014  4:00 PM  Site Assessment Clean;Intact 01/13/2014  8:00 PM  Catheter Maintenance Bag below level of bladder;Catheter secured;Drainage bag/tubing not touching floor;No dependent loops;Seal intact 01/14/2014  4:00 PM  Collection Container Standard drainage bag 01/13/2014  8:00 PM  Securement Method Leg strap 01/13/2014  8:00 PM  Urinary Catheter Interventions Unclamped 01/13/2014  8:00 PM  Output (mL) 180 mL 01/15/2014  6:00 AM    Microbiology/Sepsis markers: Results for orders placed during the hospital  encounter of 01/04/14  MRSA PCR SCREENING     Status: None   Collection Time    01/04/14  7:01 PM      Result Value Range Status   MRSA by PCR NEGATIVE  NEGATIVE Final   Comment:            The GeneXpert MRSA Assay (FDA     approved for NASAL specimens     only), is one component of a     comprehensive MRSA colonization     surveillance program. It is not     intended to diagnose MRSA     infection nor to guide or     monitor treatment for     MRSA infections.  URINE CULTURE     Status: None   Collection Time    01/08/14 11:36 AM      Result Value Range Status   Specimen Description URINE, CATHETERIZED   Final   Special Requests NONE   Final   Culture  Setup Time     Final   Value: 01/08/2014 18:13     Performed at [place]     Final   Value: NO GROWTH     Performed at [facility]   Culture     Final   Value: NO GROWTH     Performed at [facility]   Report Status 01/09/2014 FINAL   Final  CULTURE, BLOOD (ROUTINE X 2)     Status: None   Collection Time    01/09/14  7:53 AM      Result Value Range Status  Specimen Description BLOOD RIGHT ARM   Final   Special Requests BOTTLES DRAWN AEROBIC AND ANAEROBIC 10CC   Final   Culture  Setup Time     Final   Value: 01/09/2014 20:05     Performed at [facility]   Culture     Final   Value:        BLOOD CULTURE RECEIVED NO GROWTH TO DATE CULTURE WILL BE HELD FOR 5 DAYS BEFORE ISSUING A FINAL NEGATIVE REPORT     Performed at [facility]   Report Status PENDING   Incomplete  CULTURE, BLOOD (ROUTINE X 2)     Status: None   Collection Time    01/09/14  7:58 AM      Result Value Range Status   Specimen Description BLOOD RIGHT ARM   Final   Special Requests BOTTLES DRAWN AEROBIC AND ANAEROBIC 10CC   Final   Culture  Setup Time     Final   Value: 01/09/2014 20:05     Performed at [facility]   Culture     Final   Value:        BLOOD CULTURE RECEIVED NO GROWTH  TO DATE CULTURE WILL BE HELD FOR 5 DAYS BEFORE ISSUING A FINAL NEGATIVE REPORT     Performed at [facility]   Report Status PENDING   Incomplete  CULTURE, RESPIRATORY (NON-EXPECTORATED)     Status: None   Collection Time    01/12/14  8:00 AM      Result Value Range Status   Specimen Description TRACHEAL ASPIRATE   Final   Special Requests Normal   Final   Gram Stain     Final   Value: RARE WBC PRESENT, PREDOMINANTLY PMN     RARE SQUAMOUS EPITHELIAL CELLS PRESENT     FEW GRAM NEGATIVE RODS     RARE GRAM POSITIVE COCCI     IN PAIRS   Culture     Final   Value: Non-Pathogenic Oropharyngeal-type Flora Isolated.     Performed at [facility]   Report Status 01/14/2014 FINAL   Final    Anti-infectives:  Anti-infectives   Start     Dose/Rate Route Frequency Ordered Stop   01/12/14 0800  ciprofloxacin (CIPRO) IVPB 400 mg     400 mg 200 mL/hr over 60 Minutes Intravenous On call 01/12/14 0748 01/12/14 0924   01/05/14 1400  clindamycin (CLEOCIN) IVPB 600 mg     600 mg 100 mL/hr over 30 Minutes Intravenous 3 times per day 01/05/14 0855 01/06/14 2138   01/05/14 1100  gentamicin (GARAMYCIN) IVPB 60 mg     60 mg 100 mL/hr over 30 Minutes Intravenous Every 8 hours 01/05/14 0947 01/07/14 0251   01/05/14 0600  clindamycin (CLEOCIN) IVPB 300 mg  Status:  Discontinued     300 mg 100 mL/hr over 30 Minutes Intravenous 3 times per day 01/05/14 0332 01/05/14 0855   01/04/14 2115  clindamycin (CLEOCIN) IVPB 600 mg  Status:  Discontinued     600 mg 100 mL/hr over 30 Minutes Intravenous  Once 01/04/14 2104 01/07/14 0841   01/04/14 2108  clindamycin (CLEOCIN) 600 MG/50ML IVPB    Comments:  Haynes Bast   : cabinet override      01/04/14 2108 01/04/14 2120      Best Practice/Protocols:  VTE Prophylaxis: Mechanical GI Prophylaxis: Proton Pump Inhibitor Patient is still not getting any sedatiion  Consults: Treatment Team:  Winfield Cunas, MD  Events:  Subjective:     Overnight Issues: No big changes  Objective:  Vital signs for last 24 hours: Temp:  [98.2 F (36.8 C)-99.4 F (37.4 C)] 99.3 F (37.4 C) (02/07 0323) Pulse Rate:  [76-126] 126 (02/07 0850) Resp:  [15-31] 24 (02/07 0850) BP: (111-128)/(66-80) 114/71 mmHg (02/07 0600) SpO2:  [93 %-100 %] 100 % (02/07 0850) FiO2 (%):  [28 %] 28 % (02/07 0850) Weight:  [65.1 kg (143 lb 8.3 oz)] 65.1 kg (143 lb 8.3 oz) (02/07 0316)  Hemodynamic parameters for last 24 hours:    Intake/Output from previous day: 02/06 0701 - 02/07 0700 In: 2530 [I.V.:1210; NG/GT:1320] Out: 1450 [Urine:1450]  Intake/Output this shift:    Vent settings for last 24 hours: Vent Mode:  [-]  FiO2 (%):  [28 %] 28 %  Physical Exam:  General: no respiratory distress and coughs frequently. Neuro: RASS -2 Resp: clear to auscultation bilaterally and lots of upper respiratory secretions. GI: soft, nontender, BS WNL, no r/g and toleratign tube feedings well Extremities: No changes  Results for orders placed during the hospital encounter of 01/04/14 (from the past 24 hour(s))  GLUCOSE, CAPILLARY     Status: Abnormal   Collection Time    01/14/14 11:40 AM      Result Value Range   Glucose-Capillary 123 (*) 70 - 99 mg/dL  GLUCOSE, CAPILLARY     Status: Abnormal   Collection Time    01/14/14  3:57 PM      Result Value Range   Glucose-Capillary 123 (*) 70 - 99 mg/dL   Comment 1 Notify RN     Comment 2 Documented in Chart    GLUCOSE, CAPILLARY     Status: Abnormal   Collection Time    01/14/14 11:14 PM      Result Value Range   Glucose-Capillary 135 (*) 70 - 99 mg/dL  CBC WITH DIFFERENTIAL     Status: Abnormal   Collection Time    01/15/14  3:15 AM      Result Value Range   WBC 21.5 (*) 4.0 - 10.5 K/uL   RBC 3.62 (*) 3.87 - 5.11 MIL/uL   Hemoglobin 9.9 (*) 12.0 - 15.0 g/dL   HCT 31.3 (*) 36.0 - 46.0 %   MCV 86.5  78.0 - 100.0 fL   MCH 27.3  26.0 - 34.0 pg   MCHC 31.6  30.0 - 36.0 g/dL   RDW 18.9 (*) 11.5 -  15.5 %   Platelets 461 (*) 150 - 400 K/uL   Neutrophils Relative % 84 (*) 43 - 77 %   Neutro Abs 18.0 (*) 1.7 - 7.7 K/uL   Lymphocytes Relative 7 (*) 12 - 46 %   Lymphs Abs 1.4  0.7 - 4.0 K/uL   Monocytes Relative 7  3 - 12 %   Monocytes Absolute 1.5 (*) 0.1 - 1.0 K/uL   Eosinophils Relative 2  0 - 5 %   Eosinophils Absolute 0.5  0.0 - 0.7 K/uL   Basophils Relative 0  0 - 1 %   Basophils Absolute 0.0  0.0 - 0.1 K/uL  BASIC METABOLIC PANEL     Status: Abnormal   Collection Time    01/15/14  3:15 AM      Result Value Range   Sodium 143  137 - 147 mEq/L   Potassium 4.5  3.7 - 5.3 mEq/L   Chloride 103  96 - 112 mEq/L   CO2 28  19 - 32 mEq/L  Glucose, Bld 116 (*) 70 - 99 mg/dL   BUN 31 (*) 6 - 23 mg/dL   Creatinine, Ser 0.50  0.50 - 1.10 mg/dL   Calcium 9.2  8.4 - 10.5 mg/dL   GFR calc non Af Amer >90  >90 mL/min   GFR calc Af Amer >90  >90 mL/min  GLUCOSE, CAPILLARY     Status: Abnormal   Collection Time    01/15/14  3:21 AM      Result Value Range   Glucose-Capillary 131 (*) 70 - 99 mg/dL   Comment 1 Documented in Chart     Comment 2 Notify RN    GLUCOSE, CAPILLARY     Status: Abnormal   Collection Time    01/15/14  8:29 AM      Result Value Range   Glucose-Capillary 119 (*) 70 - 99 mg/dL     Assessment/Plan:   NEURO  Altered Mental Status:  coma Trauma-CNS:  coma   Plan: Continue to hold sedation.  PULM  Atelectasis/collapse (focal and LLL)   Plan: Continues to be at risk of collapsing LLL with atelectasis on that side.  CARDIO  No specific issues   Plan: CPM  RENAL  No specific issues   Plan: CPM  GI  Benign and tolerating tube feedings well.   Plan: CPM  ID  Still has leukocytosis and low grade fevers.  Not on any antibiotics.   Plan: CPM  HEME  Anemia acute blood loss anemia and anemia of critical illness)   Plan: No blood for now.  ENDO No specific issues   Plan: CPM  Global Issues  Patient has been off of sedation for a while and is still nto  able to follow any commnads for me although the mother states that she was more "active" yesterday evening with friends.  No bowel movement.  Will start Colace.  Still at risks from atelectasis of the left side in particular.  Respiratory cultures were normal flora.    LOS: 11 days   Additional comments:I reviewed the patient's new clinical lab test results. cbc/bmet and I reviewed the patients new imaging test results. cxr  Critical Care Total Time*: 45 Minutes  Jovaughn Wojtaszek O 01/15/2014  *Care during the described time interval was provided by me and/or other providers on the critical care team.  I have reviewed this patient's available data, including medical history, events of note, physical examination and test results as part of my evaluation.

## 2014-01-16 ENCOUNTER — Inpatient Hospital Stay (HOSPITAL_COMMUNITY): Payer: No Typology Code available for payment source

## 2014-01-16 LAB — CBC WITH DIFFERENTIAL/PLATELET
Basophils Absolute: 0 10*3/uL (ref 0.0–0.1)
Basophils Relative: 0 % (ref 0–1)
Eosinophils Absolute: 0.4 10*3/uL (ref 0.0–0.7)
Eosinophils Relative: 2 % (ref 0–5)
HCT: 32.1 % — ABNORMAL LOW (ref 36.0–46.0)
HEMOGLOBIN: 10.1 g/dL — AB (ref 12.0–15.0)
LYMPHS ABS: 1.7 10*3/uL (ref 0.7–4.0)
LYMPHS PCT: 8 % — AB (ref 12–46)
MCH: 27.3 pg (ref 26.0–34.0)
MCHC: 31.5 g/dL (ref 30.0–36.0)
MCV: 86.8 fL (ref 78.0–100.0)
Monocytes Absolute: 1 10*3/uL (ref 0.1–1.0)
Monocytes Relative: 5 % (ref 3–12)
NEUTROS ABS: 17.5 10*3/uL — AB (ref 1.7–7.7)
NEUTROS PCT: 85 % — AB (ref 43–77)
PLATELETS: 535 10*3/uL — AB (ref 150–400)
RBC: 3.7 MIL/uL — AB (ref 3.87–5.11)
RDW: 18.9 % — ABNORMAL HIGH (ref 11.5–15.5)
WBC: 20.7 10*3/uL — AB (ref 4.0–10.5)

## 2014-01-16 LAB — BASIC METABOLIC PANEL
BUN: 30 mg/dL — ABNORMAL HIGH (ref 6–23)
CHLORIDE: 101 meq/L (ref 96–112)
CO2: 27 mEq/L (ref 19–32)
Calcium: 9.2 mg/dL (ref 8.4–10.5)
Creatinine, Ser: 0.52 mg/dL (ref 0.50–1.10)
GFR calc non Af Amer: >90 mL/min (ref >90)
Glucose, Bld: 134 mg/dL — ABNORMAL HIGH (ref 70–99)
POTASSIUM: 4.2 meq/L (ref 3.7–5.3)
SODIUM: 141 meq/L (ref 137–147)

## 2014-01-16 LAB — GLUCOSE, CAPILLARY
GLUCOSE-CAPILLARY: 120 mg/dL — AB (ref 70–99)
GLUCOSE-CAPILLARY: 120 mg/dL — AB (ref 70–99)
GLUCOSE-CAPILLARY: 132 mg/dL — AB (ref 70–99)
GLUCOSE-CAPILLARY: 134 mg/dL — AB (ref 70–99)
GLUCOSE-CAPILLARY: 137 mg/dL — AB (ref 70–99)
Glucose-Capillary: 118 mg/dL — ABNORMAL HIGH (ref 70–99)
Glucose-Capillary: 155 mg/dL — ABNORMAL HIGH (ref 70–99)

## 2014-01-16 MED ORDER — LEVOFLOXACIN IN D5W 750 MG/150ML IV SOLN
750.0000 mg | INTRAVENOUS | Status: DC
  Administered 2014-01-16 – 2014-01-20 (×5): 750 mg via INTRAVENOUS
  Filled 2014-01-16 (×5): qty 150

## 2014-01-16 NOTE — Progress Notes (Signed)
Pt had recruitment via ambu-bag per Dr. Hulen Skains, pt remained stable throughout, Pt's vitals Sp02 on 28% atc 98%, HR 109, RR 30, Pt continues to be monitored in ICU.
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
## 2014-01-16 NOTE — Progress Notes (Signed)
Pt had lung recruitment via ambu-bag per Dr. Hulen Skains, sputum sample obtained, Pt remained stable throughtout & vital are Sp02 97%, HR 117, RR 29, Will continue to recruit throughout the day.
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
## 2014-01-16 NOTE — Progress Notes (Signed)
ANTIBIOTIC CONSULT NOTE - INITIAL  Pharmacy Consult for Levofloxacin Indication: pneumonia  Allergies  Allergen Reactions  . Penicillins Other (See Comments)    Unknown    Patient Measurements: Height: 5\' 5"  (165.1 cm) Weight: 142 lb 13.7 oz (64.8 kg) IBW/kg (Calculated) : 57   Vital Signs: Temp: 99.1 F (37.3 C) (02/08 0400) Temp src: Axillary (02/08 0400) BP: 124/86 mmHg (02/08 0700) Pulse Rate: 123 (02/08 0824) Intake/Output from previous day: 02/07 0701 - 02/08 0700 In: 2263.3 [I.V.:943.3; NG/GT:1320] Out: 1335 [Urine:1335] Intake/Output from this shift:    Labs:  Recent Labs  01/14/14 0545 01/15/14 0315 01/16/14 0325  WBC 23.8* 21.5* 20.7*  HGB 10.0* 9.9* 10.1*  PLT 412* 461* 535*  CREATININE 0.54 0.50 0.52   Estimated Creatinine Clearance: 90 ml/min (by C-G formula based on Cr of 0.52). No results found for this basename: VANCOTROUGH, Corlis Leak, VANCORANDOM, [place], [place], [place], [place], [place], TOBRARND, AMIKACINPEAK, AMIKACINTROU, AMIKACIN,  in the last 72 hours   Microbiology: Recent Results (from the past 720 hour(s))  MRSA PCR SCREENING     Status: None   Collection Time    01/04/14  7:01 PM      Result Value Range Status   MRSA by PCR NEGATIVE  NEGATIVE Final   Comment:            The GeneXpert MRSA Assay (FDA     approved for NASAL specimens     only), is one component of a     comprehensive MRSA colonization     surveillance program. It is not     intended to diagnose MRSA     infection nor to guide or     monitor treatment for     MRSA infections.  URINE CULTURE     Status: None   Collection Time    01/08/14 11:36 AM      Result Value Range Status   Specimen Description URINE, CATHETERIZED   Final   Special Requests NONE   Final   Culture  Setup Time     Final   Value: 01/08/2014 18:13     Performed at [facility]     Final   Value: NO GROWTH     Performed at [facility]   Culture     Final   Value: NO GROWTH     Performed at [facility]   Report Status 01/09/2014 FINAL   Final  CULTURE, BLOOD (ROUTINE X 2)     Status: None   Collection Time    01/09/14  7:53 AM      Result Value Range Status   Specimen Description BLOOD RIGHT ARM   Final   Special Requests BOTTLES DRAWN AEROBIC AND ANAEROBIC 10CC   Final   Culture  Setup Time     Final   Value: 01/09/2014 20:05     Performed at [facility]   Culture     Final   Value: NO GROWTH 5 DAYS     Performed at [facility]   Report Status 01/15/2014 FINAL   Final  CULTURE, BLOOD (ROUTINE X 2)     Status: None   Collection Time    01/09/14  7:58 AM      Result Value Range Status   Specimen Description BLOOD RIGHT ARM   Final   Special Requests BOTTLES DRAWN AEROBIC AND ANAEROBIC 10CC   Final   Culture  Setup Time  Final   Value: 01/09/2014 20:05     Performed at [facility]   Culture     Final   Value: NO GROWTH 5 DAYS     Performed at [facility]   Report Status 01/15/2014 FINAL   Final  CULTURE, RESPIRATORY (NON-EXPECTORATED)     Status: None   Collection Time    01/12/14  8:00 AM      Result Value Range Status   Specimen Description TRACHEAL ASPIRATE   Final   Special Requests Normal   Final   Gram Stain     Final   Value: RARE WBC PRESENT, PREDOMINANTLY PMN     RARE SQUAMOUS EPITHELIAL CELLS PRESENT     FEW GRAM NEGATIVE RODS     RARE GRAM POSITIVE COCCI     IN PAIRS   Culture     Final   Value: Non-Pathogenic Oropharyngeal-type Flora Isolated.     Performed at [facility]   Report Status 01/14/2014 FINAL   Final    Medical History: History reviewed. No pertinent past medical history.  Medications:  Scheduled:  . antiseptic oral rinse  15 mL Mouth Rinse QID  . chlorhexidine  15 mL Mouth Rinse BID  . docusate  200 mg Per Tube BID  . free water  200 mL Per Tube Q6H  . insulin aspart  0-9 Units Subcutaneous Q4H  .  ipratropium-albuterol  3 mL Nebulization Q6H  . levofloxacin (LEVAQUIN) IV  750 mg Intravenous Q24H  . pantoprazole sodium  40 mg Per Tube Daily  . propranolol  20 mg Per Tube QID  . sodium chloride  10-40 mL Intracatheter Q12H   Assessment: Asked to start levofloxacin for pneumonia.  Renal function is normal for age.  Goal of Therapy:  treat infection\  Plan:  Levofloxacin 750mg  IV daily.  Pharmacy will sign off.  Please reconsult if needed.  Candie Mile 01/16/2014,8:46 AM

## 2014-01-17 ENCOUNTER — Inpatient Hospital Stay (HOSPITAL_COMMUNITY): Payer: No Typology Code available for payment source

## 2014-01-17 ENCOUNTER — Encounter (HOSPITAL_COMMUNITY): Payer: Self-pay | Admitting: General Surgery

## 2014-01-17 DIAGNOSIS — J189 Pneumonia, unspecified organism: Secondary | ICD-10-CM

## 2014-01-17 LAB — CBC WITH DIFFERENTIAL/PLATELET
BASOS PCT: 0 % (ref 0–1)
Basophils Absolute: 0.1 10*3/uL (ref 0.0–0.1)
EOS ABS: 0.4 10*3/uL (ref 0.0–0.7)
Eosinophils Relative: 2 % (ref 0–5)
HCT: 32.8 % — ABNORMAL LOW (ref 36.0–46.0)
Hemoglobin: 10.1 g/dL — ABNORMAL LOW (ref 12.0–15.0)
Lymphocytes Relative: 9 % — ABNORMAL LOW (ref 12–46)
Lymphs Abs: 1.7 10*3/uL (ref 0.7–4.0)
MCH: 26.8 pg (ref 26.0–34.0)
MCHC: 30.8 g/dL (ref 30.0–36.0)
MCV: 87 fL (ref 78.0–100.0)
Monocytes Absolute: 1 10*3/uL (ref 0.1–1.0)
Monocytes Relative: 5 % (ref 3–12)
NEUTROS PCT: 83 % — AB (ref 43–77)
Neutro Abs: 15.4 10*3/uL — ABNORMAL HIGH (ref 1.7–7.7)
Platelets: 610 10*3/uL — ABNORMAL HIGH (ref 150–400)
RBC: 3.77 MIL/uL — AB (ref 3.87–5.11)
RDW: 18.8 % — ABNORMAL HIGH (ref 11.5–15.5)
WBC: 18.6 10*3/uL — ABNORMAL HIGH (ref 4.0–10.5)

## 2014-01-17 LAB — BASIC METABOLIC PANEL
BUN: 30 mg/dL — AB (ref 6–23)
CO2: 25 mEq/L (ref 19–32)
Calcium: 9 mg/dL (ref 8.4–10.5)
Chloride: 103 mEq/L (ref 96–112)
Creatinine, Ser: 0.54 mg/dL (ref 0.50–1.10)
Glucose, Bld: 122 mg/dL — ABNORMAL HIGH (ref 70–99)
POTASSIUM: 4.7 meq/L (ref 3.7–5.3)
SODIUM: 142 meq/L (ref 137–147)

## 2014-01-17 LAB — GLUCOSE, CAPILLARY
Glucose-Capillary: 126 mg/dL — ABNORMAL HIGH (ref 70–99)
Glucose-Capillary: 140 mg/dL — ABNORMAL HIGH (ref 70–99)
Glucose-Capillary: 127 mg/dL — ABNORMAL HIGH (ref 70–99)
Glucose-Capillary: 121 mg/dL — ABNORMAL HIGH (ref 70–99)
Glucose-Capillary: 129 mg/dL — ABNORMAL HIGH (ref 70–99)

## 2014-01-17 MED ORDER — PRO-STAT SUGAR FREE PO LIQD
30.0000 mL | Freq: Once | ORAL | Status: DC
  Filled 2014-01-17 (×2): qty 30
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
## 2014-01-17 NOTE — Progress Notes (Signed)
Patient ID: [name], female   DOB: October 05, 1981, 33 y.o.   MRN: [id number] BP 109/73  Pulse 96  Temp(Src) 98.5 F (36.9 C) (Axillary)  Resp 26  Ht 5\' 5"  (1.651 m)  Wt 63.1 kg (139 lb 1.8 oz)  BMI 23.15 kg/m2  SpO2 100% Not responsive. Cranial nerves intact There is no change neurologically Possible coma stimulation program

## 2014-01-17 NOTE — Progress Notes (Signed)
Pt bagged and lavaged at this time. A large amount of thick tan secretions was obtained. The pt tolerated well with no complications noted. RT will monitor.
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
## 2014-01-17 NOTE — Progress Notes (Signed)
Trauma Service Note  Subjective: Patient has remained of the ventilator, but secretions are better.  No more respoonsive.  Objective: Vital signs in last 24 hours: Temp:  [99.1 F (37.3 C)-99.6 F (37.6 C)] 99.5 F (37.5 C) (02/09 0339) Pulse Rate:  [85-123] 97 (02/09 0745) Resp:  [19-34] 22 (02/09 0745) BP: (102-129)/(63-84) 122/80 mmHg (02/09 0745) SpO2:  [94 %-100 %] 100 % (02/09 0745) FiO2 (%):  [28 %] 28 % (02/09 0758) Weight:  [63.1 kg (139 lb 1.8 oz)] 63.1 kg (139 lb 1.8 oz) (02/09 0400) Last BM Date: 01/14/14  Intake/Output from previous day: 02/08 0701 - 02/09 0700 In: 2965 [I.V.:1150; VO/HY:0737; IV Piggyback:150] Out: 1062 [Urine:1825] Intake/Output this shift:    General: No distress, breathing easily  Lungs: Clear to auscultation.  Abd: Benign, tolerating tube feedings well.  Extremities: No changes  Neuro: Intact as before, coma, pupils are reactive.  Lab Results: CBC   Recent Labs  01/16/14 0325 01/17/14 0325  WBC 20.7* 18.6*  HGB 10.1* 10.1*  HCT 32.1* 32.8*  PLT 535* 610*   BMET  Recent Labs  01/16/14 0325 01/17/14 0325  NA 141 142  K 4.2 4.7  CL 101 103  CO2 27 25  GLUCOSE 134* 122*  BUN 30* 30*  CREATININE 0.52 0.54  CALCIUM 9.2 9.0   PT/INR No results found for this basename: LABPROT, INR,  in the last 72 hours ABG No results found for this basename: PHART, PCO2, PO2, HCO3,  in the last 72 hours  Studies/Results: Dg Chest Port 1 View  01/17/2014   CLINICAL DATA:  Evaluate lung base atelectasis.  EXAM: PORTABLE CHEST - 1 VIEW  COMPARISON:  01/16/2014  FINDINGS: Mild lung base atelectasis noted, without significant change. The lungs are otherwise clear.  No pleural effusion or pneumothorax.  Cardiac silhouette is normal in size. Normal mediastinal contours. Tracheostomy is well positioned and stable.  IMPRESSION: 1. No acute cardiopulmonary disease. 2. Mild persistent basilar atelectasis.   Electronically Signed   By: Lajean Manes M.D.   On: 01/17/2014 07:23   Dg Chest Port 1 View  01/16/2014   CLINICAL DATA:  Followup atelectasis.  EXAM: PORTABLE CHEST - 1 VIEW  COMPARISON:  Yesterday.  FINDINGS: Tracheostomy tube in satisfactory position. Interval removal of the left subclavian catheter. The cardiac silhouette remains borderline enlarged. Mildly decreased left lower lobe atelectasis. Clear right lung.  IMPRESSION: Mildly decreased left lower lobe atelectasis.   Electronically Signed   By: Enrique Sack M.D.   On: 01/16/2014 07:30    Anti-infectives: Anti-infectives   Start     Dose/Rate Route Frequency Ordered Stop   01/16/14 1000  levofloxacin (LEVAQUIN) IVPB 750 mg     750 mg 100 mL/hr over 90 Minutes Intravenous Every 24 hours 01/16/14 0845     01/12/14 0800  ciprofloxacin (CIPRO) IVPB 400 mg     400 mg 200 mL/hr over 60 Minutes Intravenous On call 01/12/14 0748 01/12/14 0924   01/05/14 1400  clindamycin (CLEOCIN) IVPB 600 mg     600 mg 100 mL/hr over 30 Minutes Intravenous 3 times per day 01/05/14 0855 01/06/14 2138   01/05/14 1100  gentamicin (GARAMYCIN) IVPB 60 mg     60 mg 100 mL/hr over 30 Minutes Intravenous Every 8 hours 01/05/14 0947 01/07/14 0251   01/05/14 0600  clindamycin (CLEOCIN) IVPB 300 mg  Status:  Discontinued     300 mg 100 mL/hr over 30 Minutes Intravenous 3 times per day 01/05/14 6948  01/05/14 0855   01/04/14 2115  clindamycin (CLEOCIN) IVPB 600 mg  Status:  Discontinued     600 mg 100 mL/hr over 30 Minutes Intravenous  Once 01/04/14 2104 01/07/14 0841   01/04/14 2108  clindamycin (CLEOCIN) 600 MG/50ML IVPB    Comments:  Haynes Bast   : cabinet override      01/04/14 2108 01/04/14 2120      Assessment/Plan: s/p Procedure(s): PERCUTANEOUS TRACHEOSTOMY Continue antibiotics for presumed PNA. Keep in ICU one more day because of the secretions. Will not send down for PICC yet because the patient is not getting a lot of IV medications. [facility] IVFs  LOS: 13 days   Kathryne Eriksson.  Dahlia Bailiff, MD, FACS [phone] Trauma Surgeon 01/17/2014
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
## 2014-01-18 LAB — GLUCOSE, CAPILLARY
GLUCOSE-CAPILLARY: 133 mg/dL — AB (ref 70–99)
GLUCOSE-CAPILLARY: 121 mg/dL — AB (ref 70–99)
Glucose-Capillary: 114 mg/dL — ABNORMAL HIGH (ref 70–99)
Glucose-Capillary: 120 mg/dL — ABNORMAL HIGH (ref 70–99)
Glucose-Capillary: 126 mg/dL — ABNORMAL HIGH (ref 70–99)
Glucose-Capillary: 128 mg/dL — ABNORMAL HIGH (ref 70–99)
Glucose-Capillary: 142 mg/dL — ABNORMAL HIGH (ref 70–99)

## 2014-01-18 LAB — CULTURE, RESPIRATORY W GRAM STAIN: Gram Stain: NONE SEEN

## 2014-01-18 LAB — CULTURE, RESPIRATORY: SPECIAL REQUESTS: NORMAL

## 2014-01-18 NOTE — Clinical Social Work Note (Signed)
Clinical Social Worker continuing to follow patient and family for support and discharge planning needs.  Per CM note prior, "I then explained to her that the next step was to have the CSW begin the process of finding a facility that would accept the patient. I told her that the process started with CSW faxing out the [facility] to all available facilities and that we would require her permission to do so. She stated that we had her permission to do so and offered to sign something to that effect. I told her that her verbal permission was adequate and that I would document that permission so that CSW, Denyse Amass, could begin the process."  CSW has completed FL2 and initiated search to appropriate facilities.  CSW to follow up with patient and patient parents regarding bed offers.  CSW remains available for support and to facilitate patient discharge needs once medically appropriate and bed available.  Barbette Or, [place]

## 2014-01-18 NOTE — Progress Notes (Signed)
Patient ID: Murle Hellstrom, female   DOB: 1981-11-18, 33 y.o.   MRN: [id number] BP 105/62  Pulse 79  Temp(Src) 98.2 F (36.8 C) (Axillary)  Resp 24  Ht 5\' 5"  (1.651 m)  Wt 63.3 kg (139 lb 8.8 oz)  BMI 23.22 kg/m2  SpO2 99% Not responsive. There is no change neurologically. Perrl, corneals intact. Stable exam.

## 2014-01-18 NOTE — Progress Notes (Signed)
Subjective: 6 Days Post-Op Procedure(s) (LRB): PERCUTANEOUS TRACHEOSTOMY (N/A) Patient continues non-responsive    Objective: Vital signs in last 24 hours: Temp:  [98.3 F (36.8 C)-99.4 F (37.4 C)] 98.6 F (37 C) (02/10 0800) Pulse Rate:  [76-102] 98 (02/10 0800) Resp:  [13-30] 24 (02/10 0800) BP: (102-129)/(65-86) 120/75 mmHg (02/10 0800) SpO2:  [97 %-100 %] 97 % (02/10 0800) FiO2 (%):  [28 %] 28 % (02/10 0800) Weight:  [139 lb 8.8 oz (63.3 kg)] 139 lb 8.8 oz (63.3 kg) (02/10 0500)  Intake/Output from previous day: 02/09 0701 - 02/10 0700 In: 1793.3 [I.V.:323.3; NG/GT:1320; IV Piggyback:150] Out: 1890 [Urine:1890] Intake/Output this shift:     Recent Labs  01/16/14 0325 01/17/14 0325  HGB 10.1* 10.1*    Recent Labs  01/16/14 0325 01/17/14 0325  WBC 20.7* 18.6*  RBC 3.70* 3.77*  HCT 32.1* 32.8*  PLT 535* 610*    Recent Labs  01/16/14 0325 01/17/14 0325  NA 141 142  K 4.2 4.7  CL 101 103  CO2 27 25  BUN 30* 30*  CREATININE 0.52 0.54  GLUCOSE 134* 122*  CALCIUM 9.2 9.0   No results found for this basename: LABPT, INR,  in the last 72 hours  Compartment soft  Assessment/Plan: 6 Days Post-Op Procedure(s) (LRB): PERCUTANEOUS TRACHEOSTOMY (N/A) Advance diet with perenteral nutrition. Will change dressings and see about suture removal later this week or early next week  Kaelie Henigan L 01/18/2014, 8:24 AM

## 2014-01-18 NOTE — Progress Notes (Addendum)
Spoke with father this am re: pt not being candidate for LTACH.  I explained that they will not take another charity patient at this time. I also explained that pt doesn't meet their criteria for admission because she is at SNF level of care with tube feeds, tolerating 28% trach collar and one IV antibiotic q24hr. Father is insistent that the patient be allowed to go to [facility].  I told him that it would be approximately $1600-$2000/day and they would require 30 days worth up front to accept the patient, if they accepted the patient ($48,000-60,000).  He asked me to get that figure for him.  I advised him that I would get that for him today but that I would also encourage him to consider facility placement as that is the only other option aside from taking her home.  UR completed.  Sandi Mariscal, RN BSN [place] CCM Trauma/Neuro ICU Case Manager [phone]
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
## 2014-01-18 NOTE — Progress Notes (Signed)
Spoke with LTACH rep, Donnelly Stager.  She had taken this issue to her hospital CEO re: admission of pt as private pay.  They have declined to accept the patient as they do not have neurology coverage and couldn't provide appropriate care for the patient with a TBI. I went to explain this to the patient's father who was at the bedside. I then explained to him that the next step was for patient to have her info faxed out by CSW to determine which facilities will be able to accept her at a SNF level of care.  He did not give me permission to do that. He told me that he would need to speak with the patient's mother to figure out what to do next. I told him that we would need a decision from them in the next day regarding whether they would wish to take the patient home or have her placed in a facility.   Shortly after this conversation, the patient's father left the unit and within 15 minutes, I received a call from her mother. She said that the patient's dad had told her that we were trying to move the patient out of the hospital today.  I assured her that was not the case.  I explained again the reason that LTACH would not accept the patient.  I then explained to her that the next step was to have the CSW begin the process of finding a facility that would accept the patient. I told her that the process started with CSW faxing out the [facility] to all available facilities and that we would require her permission to do so.  She stated that we had her permission to do so and offered to sign something to that effect.  I told her that her verbal permission was adequate and that I would document that permission so that CSW, Denyse Amass, could begin the process.  I told her that once facilities began responding, we would present the facility choices to her and the patient's father to make their choice. I did advise her that we had little control over the location of facilities and that they could be as far away as [place], [place]  or [place].  Mother stated understanding and that she would speak to patient's father and try to help him understand.  I did advise mom that patient has transfer orders to [facility] and that she would move there when a bed became available. She again stated understanding.

## 2014-01-18 NOTE — Clinical Social Work Placement (Addendum)
Clinical Social Work Department CLINICAL SOCIAL WORK PLACEMENT NOTE 01/18/2014  Patient:  [name], [name]  Account Number:  [id number] Admit date:  01/04/2014  Clinical Social Worker:  Barbette Or, LCSW  Date/time:  01/18/2014 03:00 PM  Clinical Social Work is seeking post-discharge placement for this patient at the following level of care:   [place]   (*CSW will update this form in Epic as items are completed)   01/18/2014  Patient/family provided with [facility] Department of Clinical Social Work's list of facilities offering this level of care within the geographic area requested by the patient (or if unable, by the patient's family).  01/18/2014  Patient/family informed of their freedom to choose among providers that offer the needed level of care, that participate in Medicare, Medicaid or managed care program needed by the patient, have an available bed and are willing to accept the patient.  01/18/2014  Patient/family informed of [facility]' ownership interest in [facility], as well as of the fact that they are under no obligation to receive care at this facility.  PASARR submitted to EDS on 01/18/2014 PASARR number received from EDS on 01/18/2014  FL2 transmitted to all facilities in geographic area requested by pt/family on  01/18/2014 FL2 transmitted to all facilities within larger geographic area on 01/18/2014  Patient informed that his/her managed care company has contracts with or will negotiate with  certain facilities, including the following:     Patient/family informed of bed offers received:  02/01/2014 Patient chooses bed at [facility] Physician recommends and patient chooses bed at    Patient to be transferred to [facility] on  02/07/2014   Patient to be transferred to facility by  [facility]  The following physician request were entered in Epic:   Additional Comments: 02/10 LOG PLACEMENT
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
## 2014-01-18 NOTE — Progress Notes (Signed)
6 Days Post-Op  Subjective: Not responsive.   Off vent .   Objective: Vital signs in last 24 hours: Temp:  [98.3 F (36.8 C)-99.4 F (37.4 C)] 98.6 F (37 C) (02/10 0800) Pulse Rate:  [76-102] 92 (02/10 0939) Resp:  [13-30] 24 (02/10 0800) BP: (102-131)/(65-86) 131/80 mmHg (02/10 0939) SpO2:  [97 %-100 %] 97 % (02/10 0800) FiO2 (%):  [28 %] 28 % (02/10 0800) Weight:  [139 lb 8.8 oz (63.3 kg)] 139 lb 8.8 oz (63.3 kg) (02/10 0500) Last BM Date: 01/18/14  Intake/Output from previous day: 02/09 0701 - 02/10 0700 In: 1793.3 [I.V.:323.3; NG/GT:1320; IV Piggyback:150] Out: 1890 [Urine:1890] Intake/Output this shift:    Resp: clear to auscultation bilaterally Cardio: regular rate and rhythm, S1, S2 normal, no murmur, click, rub or gallop GI: soft, non-tender; bowel sounds normal; no masses,  no organomegaly Neurologic: Mental status: comatose.  pupils reactive  Lab Results:   Recent Labs  01/16/14 0325 01/17/14 0325  WBC 20.7* 18.6*  HGB 10.1* 10.1*  HCT 32.1* 32.8*  PLT 535* 610*   BMET  Recent Labs  01/16/14 0325 01/17/14 0325  NA 141 142  K 4.2 4.7  CL 101 103  CO2 27 25  GLUCOSE 134* 122*  BUN 30* 30*  CREATININE 0.52 0.54  CALCIUM 9.2 9.0   PT/INR No results found for this basename: LABPROT, INR,  in the last 72 hours ABG No results found for this basename: PHART, PCO2, PO2, HCO3,  in the last 72 hours  Studies/Results: Dg Chest Port 1 View  01/17/2014   CLINICAL DATA:  Evaluate lung base atelectasis.  EXAM: PORTABLE CHEST - 1 VIEW  COMPARISON:  01/16/2014  FINDINGS: Mild lung base atelectasis noted, without significant change. The lungs are otherwise clear.  No pleural effusion or pneumothorax.  Cardiac silhouette is normal in size. Normal mediastinal contours. Tracheostomy is well positioned and stable.  IMPRESSION: 1. No acute cardiopulmonary disease. 2. Mild persistent basilar atelectasis.   Electronically Signed   By: Lajean Manes M.D.   On: 01/17/2014  07:23    Anti-infectives: Anti-infectives   Start     Dose/Rate Route Frequency Ordered Stop   01/16/14 1000  levofloxacin (LEVAQUIN) IVPB 750 mg     750 mg 100 mL/hr over 90 Minutes Intravenous Every 24 hours 01/16/14 0845     01/12/14 0800  ciprofloxacin (CIPRO) IVPB 400 mg     400 mg 200 mL/hr over 60 Minutes Intravenous On call 01/12/14 0748 01/12/14 0924   01/05/14 1400  clindamycin (CLEOCIN) IVPB 600 mg     600 mg 100 mL/hr over 30 Minutes Intravenous 3 times per day 01/05/14 0855 01/06/14 2138   01/05/14 1100  gentamicin (GARAMYCIN) IVPB 60 mg     60 mg 100 mL/hr over 30 Minutes Intravenous Every 8 hours 01/05/14 0947 01/07/14 0251   01/05/14 0600  clindamycin (CLEOCIN) IVPB 300 mg  Status:  Discontinued     300 mg 100 mL/hr over 30 Minutes Intravenous 3 times per day 01/05/14 0332 01/05/14 0855   01/04/14 2115  clindamycin (CLEOCIN) IVPB 600 mg  Status:  Discontinued     600 mg 100 mL/hr over 30 Minutes Intravenous  Once 01/04/14 2104 01/07/14 0841   01/04/14 2108  clindamycin (CLEOCIN) 600 MG/50ML IVPB    Comments:  Haynes Bast   : cabinet override      01/04/14 2108 01/04/14 2120      Assessment/Plan: s/p Procedure(s) with comments: PERCUTANEOUS TRACHEOSTOMY (N/A) - BEDSIDE  TRACH To stepdown today. Continue current therapies. Discussed with father at bedside.   LOS: 14 days    Jacobi Ryant A. 01/18/2014

## 2014-01-19 LAB — CBC WITH DIFFERENTIAL/PLATELET
Basophils Absolute: 0.1 10*3/uL (ref 0.0–0.1)
Basophils Relative: 0 % (ref 0–1)
EOS ABS: 0.3 10*3/uL (ref 0.0–0.7)
Eosinophils Relative: 2 % (ref 0–5)
HEMATOCRIT: 34.1 % — AB (ref 36.0–46.0)
HEMOGLOBIN: 10.7 g/dL — AB (ref 12.0–15.0)
LYMPHS ABS: 1.5 10*3/uL (ref 0.7–4.0)
Lymphocytes Relative: 13 % (ref 12–46)
MCH: 27.4 pg (ref 26.0–34.0)
MCHC: 31.4 g/dL (ref 30.0–36.0)
MCV: 87.2 fL (ref 78.0–100.0)
MONOS PCT: 5 % (ref 3–12)
Monocytes Absolute: 0.6 10*3/uL (ref 0.1–1.0)
Neutro Abs: 9.2 10*3/uL — ABNORMAL HIGH (ref 1.7–7.7)
Neutrophils Relative %: 79 % — ABNORMAL HIGH (ref 43–77)
Platelets: 670 10*3/uL — ABNORMAL HIGH (ref 150–400)
RBC: 3.91 MIL/uL (ref 3.87–5.11)
RDW: 18.8 % — ABNORMAL HIGH (ref 11.5–15.5)
WBC: 11.6 10*3/uL — ABNORMAL HIGH (ref 4.0–10.5)

## 2014-01-19 LAB — GLUCOSE, CAPILLARY
GLUCOSE-CAPILLARY: 132 mg/dL — AB (ref 70–99)
Glucose-Capillary: 121 mg/dL — ABNORMAL HIGH (ref 70–99)
Glucose-Capillary: 123 mg/dL — ABNORMAL HIGH (ref 70–99)
Glucose-Capillary: 151 mg/dL — ABNORMAL HIGH (ref 70–99)
Glucose-Capillary: 130 mg/dL — ABNORMAL HIGH (ref 70–99)

## 2014-01-19 LAB — BASIC METABOLIC PANEL
BUN: 34 mg/dL — AB (ref 6–23)
CO2: 28 mEq/L (ref 19–32)
CREATININE: 0.52 mg/dL (ref 0.50–1.10)
Calcium: 8.9 mg/dL (ref 8.4–10.5)
Chloride: 104 mEq/L (ref 96–112)
GLUCOSE: 122 mg/dL — AB (ref 70–99)
Potassium: 4.4 mEq/L (ref 3.7–5.3)
Sodium: 146 mEq/L (ref 137–147)

## 2014-01-19 NOTE — Progress Notes (Signed)
Patient ID: Ayriel Texidor, female   DOB: 1981-02-11, 33 y.o.   MRN: [id number] BP 129/78  Pulse 92  Temp(Src) 98.6 F (37 C) (Axillary)  Resp 18  Ht 5\' 5"  (1.651 m)  Wt 62.1 kg (136 lb 14.5 oz)  BMI 22.78 kg/m2  SpO2 100% Opening eyes, not tracking  +startle response, pupils equal round, and reactive +corneals, and cough Neurologic exam stable if not slightly improved

## 2014-01-19 NOTE — Progress Notes (Signed)
Trauma Service Note  Subjective: Patient is no more responsive.  Secretions are manageable.  Objective: Vital signs in last 24 hours: Temp:  [98.2 F (36.8 C)-99.1 F (37.3 C)] 98.6 F (37 C) (02/11 0800) Pulse Rate:  [78-106] 99 (02/11 0733) Resp:  [21-32] 23 (02/11 0733) BP: (100-131)/(62-80) 110/65 mmHg (02/11 0733) SpO2:  [92 %-100 %] 100 % (02/11 0733) FiO2 (%):  [28 %] 28 % (02/11 0733) Weight:  [62.1 kg (136 lb 14.5 oz)] 62.1 kg (136 lb 14.5 oz) (02/11 0429) Last BM Date: 01/18/14  Intake/Output from previous day: 02/10 0701 - 02/11 0700 In: 1965 [I.V.:240; NG/GT:1575; IV Piggyback:150] Out: 4098 [Urine:1655] Intake/Output this shift: Total I/O In: -  Out: 125 [Urine:125]  General: No changes, no distress  Lungs: Clear after suctioning.  Abd: Soft, tolerating tube feeding  Extremities: No changes  Neuro: The same.  No following commands, will open eyes to verbal stimulus.  Lab Results: CBC   Recent Labs  01/17/14 0325  WBC 18.6*  HGB 10.1*  HCT 32.8*  PLT 610*   BMET  Recent Labs  01/17/14 0325  NA 142  K 4.7  CL 103  CO2 25  GLUCOSE 122*  BUN 30*  CREATININE 0.54  CALCIUM 9.0   PT/INR No results found for this basename: LABPROT, INR,  in the last 72 hours ABG No results found for this basename: PHART, PCO2, PO2, HCO3,  in the last 72 hours  Studies/Results: No results found.  Anti-infectives: Anti-infectives   Start     Dose/Rate Route Frequency Ordered Stop   01/16/14 1000  levofloxacin (LEVAQUIN) IVPB 750 mg     750 mg 100 mL/hr over 90 Minutes Intravenous Every 24 hours 01/16/14 0845     01/12/14 0800  ciprofloxacin (CIPRO) IVPB 400 mg     400 mg 200 mL/hr over 60 Minutes Intravenous On call 01/12/14 0748 01/12/14 0924   01/05/14 1400  clindamycin (CLEOCIN) IVPB 600 mg     600 mg 100 mL/hr over 30 Minutes Intravenous 3 times per day 01/05/14 0855 01/06/14 2138   01/05/14 1100  gentamicin (GARAMYCIN) IVPB 60 mg     60  mg 100 mL/hr over 30 Minutes Intravenous Every 8 hours 01/05/14 0947 01/07/14 0251   01/05/14 0600  clindamycin (CLEOCIN) IVPB 300 mg  Status:  Discontinued     300 mg 100 mL/hr over 30 Minutes Intravenous 3 times per day 01/05/14 0332 01/05/14 0855   01/04/14 2115  clindamycin (CLEOCIN) IVPB 600 mg  Status:  Discontinued     600 mg 100 mL/hr over 30 Minutes Intravenous  Once 01/04/14 2104 01/07/14 0841   01/04/14 2108  clindamycin (CLEOCIN) 600 MG/50ML IVPB    Comments:  Haynes Bast   : cabinet override      01/04/14 2108 01/04/14 2120      Assessment/Plan: s/p Procedure(s): PERCUTANEOUS TRACHEOSTOMY Continue ABX therapy due to Post-op infection/pneumonia Transfer to SDU is appropriate  LOS: 15 days   Kathryne Eriksson. Dahlia Bailiff, MD, FACS [phone] Trauma Surgeon 01/19/2014
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
## 2014-01-20 ENCOUNTER — Inpatient Hospital Stay (HOSPITAL_COMMUNITY): Payer: No Typology Code available for payment source

## 2014-01-20 LAB — GLUCOSE, CAPILLARY
GLUCOSE-CAPILLARY: 126 mg/dL — AB (ref 70–99)
GLUCOSE-CAPILLARY: 114 mg/dL — AB (ref 70–99)
Glucose-Capillary: 107 mg/dL — ABNORMAL HIGH (ref 70–99)
Glucose-Capillary: 119 mg/dL — ABNORMAL HIGH (ref 70–99)
Glucose-Capillary: 128 mg/dL — ABNORMAL HIGH (ref 70–99)
Glucose-Capillary: 119 mg/dL — ABNORMAL HIGH (ref 70–99)

## 2014-01-20 MED ORDER — ALBUTEROL SULFATE (2.5 MG/3ML) 0.083% IN NEBU: 2.5000 mg | INHALATION_SOLUTION | RESPIRATORY_TRACT | Status: DC | PRN

## 2014-01-20 MED ORDER — DOCUSATE SODIUM 50 MG/5ML PO LIQD
100.0000 mg | Freq: Two times a day (BID) | ORAL | Status: DC
  Administered 2014-01-21: 100 mg
  Filled 2014-01-20 (×3): qty 10

## 2014-01-20 MED ORDER — IPRATROPIUM-ALBUTEROL 0.5-2.5 (3) MG/3ML IN SOLN
3.0000 mL | Freq: Two times a day (BID) | RESPIRATORY_TRACT | Status: DC
  Administered 2014-01-20 – 2014-01-25 (×12): 3 mL via RESPIRATORY_TRACT
  Filled 2014-01-20 (×12): qty 3

## 2014-01-20 MED ORDER — GUAIFENESIN 100 MG/5ML PO SYRP
400.0000 mg | ORAL_SOLUTION | ORAL | Status: DC
  Administered 2014-01-20 – 2014-01-28 (×47): 400 mg
  Filled 2014-01-20 (×53): qty 20

## 2014-01-20 MED ORDER — PRO-STAT SUGAR FREE PO LIQD
30.0000 mL | Freq: Every day | ORAL | Status: DC
  Administered 2014-01-20 – 2014-01-25 (×6): 30 mL
  Filled 2014-01-20 (×6): qty 30

## 2014-01-20 MED ORDER — ENOXAPARIN SODIUM 40 MG/0.4ML ~~LOC~~ SOLN
40.0000 mg | SUBCUTANEOUS | Status: DC
  Administered 2014-01-20 – 2014-01-25 (×6): 40 mg via SUBCUTANEOUS
  Filled 2014-01-20 (×7): qty 0.4

## 2014-01-20 MED ORDER — FUROSEMIDE 8 MG/ML PO SOLN
20.0000 mg | Freq: Two times a day (BID) | ORAL | Status: DC
  Administered 2014-01-20 – 2014-01-24 (×8): 20 mg
  Filled 2014-01-20 (×10): qty 5
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
## 2014-01-20 NOTE — Progress Notes (Signed)
Sutures removed per MD order. Rn assissted RT. Sutures removed without complications. Cleaned around trach as best I could due to c spine collar. Pt stable throughout with sats in the uper 90's.

## 2014-01-20 NOTE — Progress Notes (Signed)
Patient ID: [name], female   DOB: Apr 29, 1981, 33 y.o.   MRN: [id number] BP 125/86  Pulse 100  Temp(Src) 98.6 F (37 C) (Oral)  Resp 9  Ht 5\' 5"  (1.651 m)  Wt 66.4 kg (146 lb 6.2 oz)  BMI 24.36 kg/m2  SpO2 99% Eyes open, coma vigil No change in exam
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
## 2014-01-20 NOTE — Progress Notes (Signed)
Minimal neurological recovery.  Still has a lot of secretions.  This patient has been seen and I agree with the findings and treatment plan.  Kathryne Eriksson. Dahlia Bailiff, MD, [place] [phone] (pager) [phone] (direct pager) Trauma Surgeon

## 2014-01-21 DIAGNOSIS — S36039A Unspecified laceration of spleen, initial encounter: Secondary | ICD-10-CM

## 2014-01-21 DIAGNOSIS — S272XXA Traumatic hemopneumothorax, initial encounter: Secondary | ICD-10-CM

## 2014-01-21 DIAGNOSIS — S065XAA Traumatic subdural hemorrhage with loss of consciousness status unknown, initial encounter: Secondary | ICD-10-CM

## 2014-01-21 DIAGNOSIS — S06330A Contusion and laceration of cerebrum, unspecified, without loss of consciousness, initial encounter: Secondary | ICD-10-CM

## 2014-01-21 DIAGNOSIS — S065X9A Traumatic subdural hemorrhage with loss of consciousness of unspecified duration, initial encounter: Secondary | ICD-10-CM

## 2014-01-21 DIAGNOSIS — S2249XA Multiple fractures of ribs, unspecified side, initial encounter for closed fracture: Secondary | ICD-10-CM

## 2014-01-21 LAB — GLUCOSE, CAPILLARY
GLUCOSE-CAPILLARY: 139 mg/dL — AB (ref 70–99)
GLUCOSE-CAPILLARY: 128 mg/dL — AB (ref 70–99)
Glucose-Capillary: 132 mg/dL — ABNORMAL HIGH (ref 70–99)
Glucose-Capillary: 133 mg/dL — ABNORMAL HIGH (ref 70–99)
Glucose-Capillary: 129 mg/dL — ABNORMAL HIGH (ref 70–99)
Glucose-Capillary: 136 mg/dL — ABNORMAL HIGH (ref 70–99)

## 2014-01-21 LAB — CLOSTRIDIUM DIFFICILE BY PCR: Toxigenic C. Difficile by PCR: NEGATIVE

## 2014-01-21 NOTE — Progress Notes (Signed)
UR completed. Awaiting SNF placement.   Sandi Mariscal, RN BSN [place] CCM Trauma/Neuro ICU Case Manager [phone]
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
## 2014-01-21 NOTE — Clinical Social Work Note (Signed)
Clinical Social Worker continuing to follow patient and family for support and discharge planning needs.  CSW has received denials from [place], [place], [place], [place], [place], [facility] [place], [place].  CSW await response from [facility], [facility] and [facility] [place].  CSW to provide patient family with bed offer once available.  CSW remains available for support as needed to facilitate patient discharge needs once medically stable and SNF bed obtained.  Barbette Or, [place]
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
## 2014-01-22 DIAGNOSIS — D62 Acute posthemorrhagic anemia: Secondary | ICD-10-CM

## 2014-01-22 LAB — GLUCOSE, CAPILLARY
GLUCOSE-CAPILLARY: 145 mg/dL — AB (ref 70–99)
Glucose-Capillary: 122 mg/dL — ABNORMAL HIGH (ref 70–99)
Glucose-Capillary: 133 mg/dL — ABNORMAL HIGH (ref 70–99)
Glucose-Capillary: 137 mg/dL — ABNORMAL HIGH (ref 70–99)
Glucose-Capillary: 139 mg/dL — ABNORMAL HIGH (ref 70–99)

## 2014-01-22 NOTE — Progress Notes (Signed)
I saw the patient, participated in the history, exam and medical decision making, and concur with the physician assistant's note above.  No significant change.  Mild tachycardia.  cta but with upper airway secretions Soft, nd. g tube intact No edema  Cont current care. Check full labs on Monday  Leighton Ruff. Redmond Pulling, MD, FACS General, Bariatric, & Minimally Invasive Surgery [facility], [place]
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
## 2014-01-23 LAB — GLUCOSE, CAPILLARY
GLUCOSE-CAPILLARY: 124 mg/dL — AB (ref 70–99)
GLUCOSE-CAPILLARY: 115 mg/dL — AB (ref 70–99)
Glucose-Capillary: 122 mg/dL — ABNORMAL HIGH (ref 70–99)
Glucose-Capillary: 132 mg/dL — ABNORMAL HIGH (ref 70–99)
Glucose-Capillary: 141 mg/dL — ABNORMAL HIGH (ref 70–99)
Glucose-Capillary: 130 mg/dL — ABNORMAL HIGH (ref 70–99)
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
## 2014-01-24 ENCOUNTER — Inpatient Hospital Stay (HOSPITAL_COMMUNITY): Payer: No Typology Code available for payment source

## 2014-01-24 DIAGNOSIS — S064XAA Epidural hemorrhage with loss of consciousness status unknown, initial encounter: Secondary | ICD-10-CM | POA: Diagnosis not present

## 2014-01-24 LAB — COMPREHENSIVE METABOLIC PANEL
ALK PHOS: 286 U/L — AB (ref 39–117)
ALT: 30 U/L (ref 0–35)
AST: 35 U/L (ref 0–37)
Albumin: 3.4 g/dL — ABNORMAL LOW (ref 3.5–5.2)
BUN: 54 mg/dL — ABNORMAL HIGH (ref 6–23)
CALCIUM: 9.9 mg/dL (ref 8.4–10.5)
CO2: 30 meq/L (ref 19–32)
Chloride: 101 mEq/L (ref 96–112)
Creatinine, Ser: 0.59 mg/dL (ref 0.50–1.10)
GFR calc Af Amer: >90 mL/min (ref >90)
GFR calc non Af Amer: >90 mL/min (ref >90)
Glucose, Bld: 124 mg/dL — ABNORMAL HIGH (ref 70–99)
POTASSIUM: 3.8 meq/L (ref 3.7–5.3)
SODIUM: 145 meq/L (ref 137–147)
TOTAL PROTEIN: 7.9 g/dL (ref 6.0–8.3)
Total Bilirubin: 0.3 mg/dL (ref 0.3–1.2)

## 2014-01-24 LAB — PREALBUMIN: Prealbumin: 29.6 mg/dL (ref 17.0–34.0)

## 2014-01-24 LAB — GLUCOSE, CAPILLARY
GLUCOSE-CAPILLARY: 135 mg/dL — AB (ref 70–99)
GLUCOSE-CAPILLARY: 130 mg/dL — AB (ref 70–99)
GLUCOSE-CAPILLARY: 155 mg/dL — AB (ref 70–99)
GLUCOSE-CAPILLARY: 138 mg/dL — AB (ref 70–99)
Glucose-Capillary: 123 mg/dL — ABNORMAL HIGH (ref 70–99)
Glucose-Capillary: 131 mg/dL — ABNORMAL HIGH (ref 70–99)
Glucose-Capillary: 144 mg/dL — ABNORMAL HIGH (ref 70–99)
Glucose-Capillary: 160 mg/dL — ABNORMAL HIGH (ref 70–99)

## 2014-01-24 LAB — CBC
HCT: 42.5 % (ref 36.0–46.0)
HEMOGLOBIN: 12.8 g/dL (ref 12.0–15.0)
MCH: 27.1 pg (ref 26.0–34.0)
MCHC: 30.1 g/dL (ref 30.0–36.0)
MCV: 89.9 fL (ref 78.0–100.0)
PLATELETS: 689 10*3/uL — AB (ref 150–400)
RBC: 4.73 MIL/uL (ref 3.87–5.11)
RDW: 18.3 % — ABNORMAL HIGH (ref 11.5–15.5)
WBC: 13.7 10*3/uL — ABNORMAL HIGH (ref 4.0–10.5)

## 2014-01-24 MED ORDER — FREE WATER
200.0000 mL | Freq: Three times a day (TID) | Status: DC
  Administered 2014-01-24 – 2014-01-25 (×3): 200 mL

## 2014-01-24 MED ORDER — WHITE PETROLATUM GEL
Status: AC
  Filled 2014-01-24: qty 5

## 2014-01-24 MED ORDER — PROPRANOLOL HCL 20 MG/5ML PO SOLN
40.0000 mg | Freq: Three times a day (TID) | ORAL | Status: DC
  Administered 2014-01-24 (×3): 40 mg
  Filled 2014-01-24 (×8): qty 10

## 2014-01-24 MED ORDER — FUROSEMIDE 8 MG/ML PO SOLN
40.0000 mg | Freq: Two times a day (BID) | ORAL | Status: DC
  Administered 2014-01-24 – 2014-01-25 (×3): 40 mg
  Filled 2014-01-24 (×6): qty 5

## 2014-01-24 NOTE — Progress Notes (Addendum)
UR completed.  Noted that mom now saying she wants to take patient home. Unsure of pt's father's opinion of this.  There is no insurance coverage for the [facility] that patient will need.  Family would also have to pay up front for all necessary DME needed to care for patient at home. Will confer with CSW and medical team. CSW continues to work on placement in SNF for patient as alternative.   Sandi Mariscal, RN BSN [place] CCM Trauma/Neuro ICU Case Manager [phone]
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
## 2014-01-24 NOTE — Clinical Social Work Note (Signed)
Clinical Social Worker continuing to follow patient and family for support and discharge planning needs.  No family at bedside upon [place] arrival.  CSW has continued to contact facilities regarding letter of guarantee placement.  [facility] and [facility] have responded with a bed offer denial.  CSW will await administrative decision from [employer].  CM working with patient family regarding the possibility of patient family taking care of patient in the home.  CSW remains available for support and to assist with patient discharge needs once disposition determined.  Barbette Or, [place]

## 2014-01-24 NOTE — Progress Notes (Signed)
Blinks to threat, eyes open but does not track, noted family desire for her to eventually go home with her mother. Continue therapies.Needs abdominal binder as ordered. Patient examined and I agree with the assessment and plan  Georganna Skeans, MD, MPH, FACS Pager: [phone]  01/24/2014 9:49 AM

## 2014-01-24 NOTE — Progress Notes (Addendum)
No family present in pt's room today. Called pt's father to see if he was aware of the patient's mother wanting to take the patient home.  He said that he was aware she had asked about that.  He did not state whether he agreed with this plan or not.  He said that he didn't like the fact that I was speaking to him and his ex-wife separately. I explained to him that since I have not seen them together, this was my only option.  I offered to arrange a meeting and he said that it wouldn't be until after the 19th as that is when they are scheduled to go before a judge re: guardianship.  He also stated that he didn't understand why I was asking all of these questions now when she was not going to be getting out of the hospital any time soon.  I explained to him that the CSW and I are required to begin planning ahead of when the patient is actually ready so that we have the options determined and preliminary plans ready at the time that the patient is ready.    IF the choice is for family to take the patient home, and IF her Medicaid is not yet approved/active, the family will have to pay privately for her DME and [place]. This payment is made up front for any purchase DME and for at least the first month for rental DME.  She would need at the minimum:  Hospital bed: $128/month Hoyer lift: $70-80/month Tube feed pump: unsure of rental Tube feeds: also listed as DME and price varies by brand/type HHRN:  Approx. $155/visit  Pt will require ambulance transport home and to any MD appointments.  This can cost approx. $275/base price, $15/hour for any oxygen requirement and $9+/mile.  Family would also require extensive teaching to be provided here before the patient leaves.    **Spoke with mother at 1545pm today. She has started the patient's applications today for Medicaid and Disability. She signed the paperwork to make sure that transportation was included in her Medicaid coverage. Mother reports that she, the  patient's father and one other family member would be the primary caregivers to the patient. They are willing to be taught the skills required for home care and feel that it would be best for pt to go to short term SNF while they continue to observe the care pt needs and ensure that all caregivers are adequately taught.  Mom reports that they have access to a hospital bed and a wheelchair. Also reports having a good relationship with the local DME company who would likely work with them on payment plans for DME. The patient would reside in a ground floor apt where the caregivers can room in with her during their shift. They plan to have the bathroom remodeled in order to meet pt's needs.   Mom also stated that she would like a family meeting to involve pt's mom, dad, aunt (other caregiver), Financial counselor M. [place], [place] CSW, me and some medical team representative. Will plan this for later this week--tentatively 01/28/2014 1030am.
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
## 2014-01-25 LAB — GLUCOSE, CAPILLARY
Glucose-Capillary: 130 mg/dL — ABNORMAL HIGH (ref 70–99)
Glucose-Capillary: 151 mg/dL — ABNORMAL HIGH (ref 70–99)
Glucose-Capillary: 131 mg/dL — ABNORMAL HIGH (ref 70–99)
Glucose-Capillary: 119 mg/dL — ABNORMAL HIGH (ref 70–99)
Glucose-Capillary: 135 mg/dL — ABNORMAL HIGH (ref 70–99)

## 2014-01-25 MED ORDER — PROPRANOLOL HCL 20 MG/5ML PO SOLN
40.0000 mg | Freq: Four times a day (QID) | ORAL | Status: DC
  Filled 2014-01-25 (×3): qty 10

## 2014-01-25 MED ORDER — FREE WATER
100.0000 mL | Freq: Four times a day (QID) | Status: DC
  Administered 2014-01-25 – 2014-01-27 (×8): 100 mL

## 2014-01-25 MED ORDER — JEVITY 1.2 CAL PO LIQD
1000.0000 mL | ORAL | Status: DC
  Administered 2014-01-25 – 2014-02-02 (×9): 1000 mL
  Administered 2014-02-03: 18:00:00
  Administered 2014-02-03 – 2014-02-05 (×2): 1000 mL
  Administered 2014-02-06 – 2014-02-07 (×2)
  Filled 2014-01-25 (×8): qty 1000
  Filled 2014-01-25 (×2): qty 237
  Filled 2014-01-25 (×10): qty 1000
  Filled 2014-01-25: qty 237
  Filled 2014-01-25 (×11): qty 1000
  Filled 2014-01-25: qty 237

## 2014-01-25 MED ORDER — IPRATROPIUM-ALBUTEROL 0.5-2.5 (3) MG/3ML IN SOLN
3.0000 mL | Freq: Four times a day (QID) | RESPIRATORY_TRACT | Status: DC
  Administered 2014-01-26 – 2014-02-07 (×50): 3 mL via RESPIRATORY_TRACT
  Filled 2014-01-25 (×51): qty 3

## 2014-01-25 MED ORDER — GLYCOPYRROLATE 1 MG PO TABS
2.0000 mg | ORAL_TABLET | Freq: Three times a day (TID) | ORAL | Status: DC
  Administered 2014-01-25 (×3): 2 mg
  Filled 2014-01-25 (×6): qty 2

## 2014-01-25 MED ORDER — PROPRANOLOL HCL 20 MG/5ML PO SOLN
40.0000 mg | Freq: Four times a day (QID) | ORAL | Status: DC
  Administered 2014-01-25 – 2014-01-31 (×23): 40 mg
  Filled 2014-01-25 (×28): qty 10
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
## 2014-01-25 NOTE — Progress Notes (Signed)
Pt seen at this time for trach consult. Spoke with RN- possible discharge home with family. No family present at this time. Will continue to follow. RN aware to call if trach education needed for family.
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
## 2014-01-25 NOTE — Progress Notes (Signed)
Pt has c collar and is receiving little to no o2 thru tc (28% leaking around collar).  Pts secretions are becoming harder to suction and harder for her to cough up.  RT removed o2 and placed aerosol on air flow meter to give pt more humidity without more o2.  RT will continue to monitor.

## 2014-01-25 NOTE — Progress Notes (Signed)
Neuro exam stable. Plan short-term SNF then home with family. Increase BB for elevated HR. Patient examined and I agree with the assessment and plan  Georganna Skeans, MD, MPH, FACS Pager: [phone]  01/25/2014 10:10 AM
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
## 2014-01-26 ENCOUNTER — Inpatient Hospital Stay (HOSPITAL_COMMUNITY): Payer: No Typology Code available for payment source

## 2014-01-26 DIAGNOSIS — R509 Fever, unspecified: Secondary | ICD-10-CM

## 2014-01-26 LAB — URINALYSIS, ROUTINE W REFLEX MICROSCOPIC
Bilirubin Urine: NEGATIVE
GLUCOSE, UA: NEGATIVE mg/dL
Hgb urine dipstick: NEGATIVE
Ketones, ur: NEGATIVE mg/dL
LEUKOCYTES UA: NEGATIVE
Nitrite: NEGATIVE
PROTEIN: NEGATIVE mg/dL
Specific Gravity, Urine: 1.036 — ABNORMAL HIGH (ref 1.005–1.030)
UROBILINOGEN UA: 0.2 mg/dL (ref 0.0–1.0)
pH: 5.5 (ref 5.0–8.0)

## 2014-01-26 LAB — BASIC METABOLIC PANEL
BUN: 65 mg/dL — AB (ref 6–23)
CO2: 30 mEq/L (ref 19–32)
Calcium: 10 mg/dL (ref 8.4–10.5)
Chloride: 105 mEq/L (ref 96–112)
Creatinine, Ser: 0.81 mg/dL (ref 0.50–1.10)
GFR calc Af Amer: >90 mL/min (ref >90)
Glucose, Bld: 136 mg/dL — ABNORMAL HIGH (ref 70–99)
POTASSIUM: 3.8 meq/L (ref 3.7–5.3)
SODIUM: 149 meq/L — AB (ref 137–147)

## 2014-01-26 LAB — PROTIME-INR
INR: 1.07 (ref 0.00–1.49)
PROTHROMBIN TIME: 13.7 s (ref 11.6–15.2)

## 2014-01-26 LAB — CBC
HCT: 45.3 % (ref 36.0–46.0)
Hemoglobin: 14.1 g/dL (ref 12.0–15.0)
MCH: 28.1 pg (ref 26.0–34.0)
MCHC: 31.1 g/dL (ref 30.0–36.0)
MCV: 90.4 fL (ref 78.0–100.0)
PLATELETS: 446 10*3/uL — AB (ref 150–400)
RBC: 5.01 MIL/uL (ref 3.87–5.11)
RDW: 18.4 % — ABNORMAL HIGH (ref 11.5–15.5)
WBC: 15.4 10*3/uL — ABNORMAL HIGH (ref 4.0–10.5)

## 2014-01-26 LAB — GLUCOSE, CAPILLARY
GLUCOSE-CAPILLARY: 167 mg/dL — AB (ref 70–99)
Glucose-Capillary: 129 mg/dL — ABNORMAL HIGH (ref 70–99)
Glucose-Capillary: 170 mg/dL — ABNORMAL HIGH (ref 70–99)

## 2014-01-26 LAB — PROCALCITONIN: Procalcitonin: <0.1 ng/mL

## 2014-01-26 MED ORDER — GLYCOPYRROLATE 1 MG PO TABS
2.0000 mg | ORAL_TABLET | Freq: Two times a day (BID) | ORAL | Status: DC
  Administered 2014-01-26 – 2014-01-30 (×10): 2 mg
  Filled 2014-01-26 (×12): qty 2

## 2014-01-26 MED ORDER — LEVOFLOXACIN IN D5W 750 MG/150ML IV SOLN
750.0000 mg | INTRAVENOUS | Status: DC
  Administered 2014-01-26 – 2014-01-28 (×3): 750 mg via INTRAVENOUS
  Filled 2014-01-26 (×3): qty 150

## 2014-01-26 MED ORDER — IBUPROFEN 100 MG/5ML PO SUSP
400.0000 mg | Freq: Three times a day (TID) | ORAL | Status: DC | PRN
  Administered 2014-01-26 (×2): 400 mg
  Filled 2014-01-26 (×2): qty 20

## 2014-01-26 MED ORDER — ENOXAPARIN SODIUM 60 MG/0.6ML ~~LOC~~ SOLN
1.0000 mg/kg | Freq: Two times a day (BID) | SUBCUTANEOUS | Status: DC
  Administered 2014-01-26 – 2014-02-01 (×12): 60 mg via SUBCUTANEOUS
  Filled 2014-01-26 (×16): qty 0.6

## 2014-01-26 MED ORDER — WARFARIN SODIUM 7.5 MG PO TABS
7.5000 mg | ORAL_TABLET | Freq: Once | ORAL | Status: AC
  Administered 2014-01-26: 7.5 mg via ORAL
  Filled 2014-01-26: qty 1

## 2014-01-26 MED ORDER — WARFARIN - PHARMACIST DOSING INPATIENT
Freq: Every day | Status: DC
  Administered 2014-01-27 – 2014-01-29 (×2)
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
## 2014-01-26 NOTE — Progress Notes (Addendum)
*  PRELIMINARY RESULTS* Vascular Ultrasound Upper extremity venous duplex and Lower extremity venous duplex has been completed.  Preliminary findings:   Upper = DVT involving the right subclavian and axillary veins. Superficial thrombosis of the right cephalic and basilic veins.  Not all veins imaged in left arm due to bandages. No obvious DVT noted on the left.  Lower = no evidence of DVT.    Landry Mellow, RDMS, RVT Estella Husk, RVT  01/26/2014, 11:25 AM

## 2014-01-26 NOTE — Progress Notes (Addendum)
Pt continues to have elevated temp last reading 104.4 rectally. Pt also having new left sided gaze. MD called and notified, plans to see pt shortly. No new orders at this time. Will continue to monitor.

## 2014-01-26 NOTE — Progress Notes (Signed)
MD notified regarding vascular lab results. No new orders at this time will continue to monitor.

## 2014-01-26 NOTE — Progress Notes (Signed)
Subjective: 14 Days Post-Op Procedure(s) (LRB): PERCUTANEOUS TRACHEOSTOMY (N/A) Patient reports pain as non responsive.    Objective: Vital signs in last 24 hours: Temp:  [99.7 F (37.6 C)-104.5 F (40.3 C)] 104.4 F (40.2 C) (02/18 0807) Pulse Rate:  [101-133] 111 (02/18 0913) Resp:  [26-44] 28 (02/18 0913) BP: (103-122)/(61-87) 104/67 mmHg (02/18 0807) SpO2:  [92 %-100 %] 97 % (02/18 0913) FiO2 (%):  [21 %-28 %] 21 % (02/18 0913) Weight:  [132 lb 15 oz (60.3 kg)] 132 lb 15 oz (60.3 kg) (02/18 0500)  Intake/Output from previous day: 02/17 0701 - 02/18 0700 In: 1535 [NG/GT:1435] Out: 0  Intake/Output this shift: Total I/O In: 150 [NG/GT:150] Out: -    Recent Labs  01/24/14 0335 01/26/14 0235  HGB 12.8 14.1    Recent Labs  01/24/14 0335 01/26/14 0235  WBC 13.7* 15.4*  RBC 4.73 5.01  HCT 42.5 45.3  PLT 689* 446*    Recent Labs  01/24/14 0335 01/26/14 0235  NA 145 149*  K 3.8 3.8  CL 101 105  CO2 30 30  BUN 54* 65*  CREATININE 0.59 0.81  GLUCOSE 124* 136*  CALCIUM 9.9 10.0   No results found for this basename: LABPT, INR,  in the last 72 hours  No cellulitis present Compartment soft CXR shows good early callous formation of right clavicle fracture Assessment/Plan: 14 Days Post-Op Procedure(s) (LRB): PERCUTANEOUS TRACHEOSTOMY (N/A) Up with therapy May remove post splint for gentle rom left side including flex/ext and rotation.  No restrictions on shoulder rom as well. r side may begin rom of shoulder / elbow and wrist as well. Will order x-rays on rounds tomorrow  Jory Welke L 01/26/2014, 12:50 PM

## 2014-01-26 NOTE — Progress Notes (Signed)
Patient's fevers are likely central as all of her cultures have been negative.  Has been placed back on Levoquin.  Will anticoagulate for RUE DVT.  No other changes.  This patient has been seen and I agree with the findings and treatment plan.  Kathryne Eriksson. Dahlia Bailiff, MD, [place] [phone] (pager) [phone] (direct pager) Trauma Surgeon

## 2014-01-26 NOTE — Progress Notes (Signed)
Pt is maintaining a respiration rate in the 40's. Pt is having a hard time moving secretions and is requiring frequent suctioning. Md paged and returned page. Md gave orders and orders were followed. Will continue to monitor pt.

## 2014-01-26 NOTE — Clinical Social Work Note (Signed)
Clinical Social Worker continuing to follow patient and family for support and discharge planning needs.  CSW received notification from last facility, [facility] who provided a denial stating that patient is too medically complex.  Patient has had medical decline per trauma treatment team.  CSW remains available for support and will follow up with patient family during family conference on 02/20.  [name], [name]

## 2014-01-27 ENCOUNTER — Inpatient Hospital Stay (HOSPITAL_COMMUNITY): Payer: No Typology Code available for payment source

## 2014-01-27 LAB — CBC
HCT: 39.6 % (ref 36.0–46.0)
Hemoglobin: 12 g/dL (ref 12.0–15.0)
MCH: 27.3 pg (ref 26.0–34.0)
MCHC: 30.3 g/dL (ref 30.0–36.0)
MCV: 90.2 fL (ref 78.0–100.0)
PLATELETS: 341 10*3/uL (ref 150–400)
RBC: 4.39 MIL/uL (ref 3.87–5.11)
RDW: 17.8 % — ABNORMAL HIGH (ref 11.5–15.5)
WBC: 13.1 10*3/uL — ABNORMAL HIGH (ref 4.0–10.5)

## 2014-01-27 LAB — PROTIME-INR
INR: 1.07 (ref 0.00–1.49)
PROTHROMBIN TIME: 13.7 s (ref 11.6–15.2)

## 2014-01-27 LAB — BASIC METABOLIC PANEL
BUN: 50 mg/dL — ABNORMAL HIGH (ref 6–23)
CALCIUM: 9.4 mg/dL (ref 8.4–10.5)
CO2: 32 meq/L (ref 19–32)
Chloride: 105 mEq/L (ref 96–112)
Creatinine, Ser: 0.52 mg/dL (ref 0.50–1.10)
GFR calc Af Amer: >90 mL/min (ref >90)
GFR calc non Af Amer: >90 mL/min (ref >90)
GLUCOSE: 135 mg/dL — AB (ref 70–99)
POTASSIUM: 3.1 meq/L — AB (ref 3.7–5.3)
SODIUM: 149 meq/L — AB (ref 137–147)

## 2014-01-27 LAB — GLUCOSE, CAPILLARY
GLUCOSE-CAPILLARY: 145 mg/dL — AB (ref 70–99)
GLUCOSE-CAPILLARY: 145 mg/dL — AB (ref 70–99)
GLUCOSE-CAPILLARY: 151 mg/dL — AB (ref 70–99)
GLUCOSE-CAPILLARY: 94 mg/dL (ref 70–99)
Glucose-Capillary: 118 mg/dL — ABNORMAL HIGH (ref 70–99)
Glucose-Capillary: 137 mg/dL — ABNORMAL HIGH (ref 70–99)
Glucose-Capillary: 129 mg/dL — ABNORMAL HIGH (ref 70–99)

## 2014-01-27 MED ORDER — POTASSIUM CHLORIDE 20 MEQ/15ML (10%) PO LIQD
40.0000 meq | Freq: Two times a day (BID) | ORAL | Status: AC
  Administered 2014-01-27 – 2014-01-28 (×4): 40 meq
  Filled 2014-01-27 (×4): qty 30

## 2014-01-27 MED ORDER — FREE WATER
200.0000 mL | Freq: Three times a day (TID) | Status: DC
  Administered 2014-01-27 – 2014-01-28 (×3): 200 mL

## 2014-01-27 MED ORDER — WARFARIN SODIUM 7.5 MG PO TABS
7.5000 mg | ORAL_TABLET | Freq: Once | ORAL | Status: AC
  Administered 2014-01-27: 7.5 mg via ORAL
  Filled 2014-01-27: qty 1

## 2014-01-27 NOTE — Progress Notes (Signed)
Pharmacy Note-Anticoagulation  Pharmacy Consult :  33 y.o. female is currently on Coumadin with Lovenox bridging for + DVT.   Latest Labs : Hematology :  Recent Labs  01/26/14 0235 01/26/14 1642 01/27/14 0230  HGB 14.1  --  12.0  HCT 45.3  --  39.6  PLT 446*  --  341  LABPROT  --  13.7 13.7  INR  --  1.07 1.07  CREATININE 0.81  --  0.52    Lab Results  Component Value Date   INR 1.07 01/27/2014   INR 1.07 01/26/2014   INR 1.15 01/04/2014        HGB 12.0 01/27/2014   HGB 14.1 01/26/2014   HGB 12.8 01/24/2014    Current Medication[s] Include: Scheduled:  Scheduled:  . antiseptic oral rinse  15 mL Mouth Rinse QID  . chlorhexidine  15 mL Mouth Rinse BID  . enoxaparin (LOVENOX) injection  1 mg/kg Subcutaneous Q12H  . free water  200 mL Per Tube 3 times per day  . glycopyrrolate  2 mg Per Tube BID  . guaifenesin  400 mg Per Tube 6 times per day  . ipratropium-albuterol  3 mL Nebulization Q6H  . levofloxacin (LEVAQUIN) IV  750 mg Intravenous Q24H  . pantoprazole sodium  40 mg Per Tube Daily  . potassium chloride  40 mEq Per Tube BID  . propranolol  40 mg Per Tube 4 times per day  . Warfarin - Pharmacist Dosing Inpatient   Does not apply q1800   Infusion[s]: Infusions:  . feeding supplement (JEVITY 1.2 CAL) 1,000 mL (01/27/14 0700)   Antibiotic[s]: Anti-infectives   Start     Dose/Rate Route Frequency Ordered Stop   01/26/14 0400  levofloxacin (LEVAQUIN) IVPB 750 mg     750 mg 100 mL/hr over 90 Minutes Intravenous Every 24 hours 01/26/14 0318        Assessment :  Today's INR is Subtherapeutic after single dose of Coumadin.   INR is 1.07.    Lovenox bridging continuing at 60 mg sq q 12 hours.   Patient is on Levaquin, a potentially potent INR enhancer due to interaction with Coumadin.  Will monitor closely.   No bleeding complications observed.  Goal :  INR goal is 2-3    Lovenox Anti-Xa level 0.6-1 units/ml 4hrs after LMWH dose given.  Dose per MD.  Plan  : 1. Lovenox bridging minimum of 5 days overlap with Coumadin, then an additional 24 - 48 hours overlap once INR therapeutic. 2. Coumadin 7.5 mg po today. 3. Daily INR's, CBC. Monitor for bleeding complications   Estelle June, Pharm.D. 01/27/2014  12:04 PM
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
## 2014-01-27 NOTE — Progress Notes (Signed)
UR completed. Family meeting planned for tomorrow 1030am in [facility] conference room with both parents, CSW, Financial Counselor M. [place], [place] and [place].  Sandi Mariscal, RN BSN [place] CCM Trauma/Neuro ICU Case Manager [phone]

## 2014-01-27 NOTE — Progress Notes (Signed)
01/27/2014- 1645- Resp Care note- Trach changed from a 6 cuffed to a 6 cuffless as per MD order.  Junious Dresser assisted with procedure.  EZ Cap end tidal check done post procedure with good color change.  Pt tolerated procedure well with sats maintained at 97% during procedure.  Trach secured with velcro ties.  No complications noted.  S [facility] [place] rrt, rcp
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
## 2014-01-28 DIAGNOSIS — E870 Hyperosmolality and hypernatremia: Secondary | ICD-10-CM

## 2014-01-28 LAB — CBC
HCT: 37.7 % (ref 36.0–46.0)
HEMOGLOBIN: 11.4 g/dL — AB (ref 12.0–15.0)
MCH: 27.1 pg (ref 26.0–34.0)
MCHC: 30.2 g/dL (ref 30.0–36.0)
MCV: 89.5 fL (ref 78.0–100.0)
Platelets: 246 10*3/uL (ref 150–400)
RBC: 4.21 MIL/uL (ref 3.87–5.11)
RDW: 17.9 % — ABNORMAL HIGH (ref 11.5–15.5)
WBC: 9.8 10*3/uL (ref 4.0–10.5)

## 2014-01-28 LAB — GLUCOSE, CAPILLARY
GLUCOSE-CAPILLARY: 116 mg/dL — AB (ref 70–99)
GLUCOSE-CAPILLARY: 125 mg/dL — AB (ref 70–99)
Glucose-Capillary: 140 mg/dL — ABNORMAL HIGH (ref 70–99)
Glucose-Capillary: 117 mg/dL — ABNORMAL HIGH (ref 70–99)
Glucose-Capillary: 98 mg/dL (ref 70–99)
Glucose-Capillary: 116 mg/dL — ABNORMAL HIGH (ref 70–99)

## 2014-01-28 LAB — BASIC METABOLIC PANEL
BUN: 42 mg/dL — AB (ref 6–23)
CO2: 29 meq/L (ref 19–32)
Calcium: 9.2 mg/dL (ref 8.4–10.5)
Chloride: 109 mEq/L (ref 96–112)
Creatinine, Ser: 0.55 mg/dL (ref 0.50–1.10)
GFR calc Af Amer: >90 mL/min (ref >90)
GFR calc non Af Amer: >90 mL/min (ref >90)
GLUCOSE: 118 mg/dL — AB (ref 70–99)
Potassium: 4.3 mEq/L (ref 3.7–5.3)
SODIUM: 151 meq/L — AB (ref 137–147)

## 2014-01-28 LAB — CULTURE, RESPIRATORY

## 2014-01-28 LAB — PROCALCITONIN

## 2014-01-28 LAB — PROTIME-INR
INR: 1.07 (ref 0.00–1.49)
Prothrombin Time: 13.7 seconds (ref 11.6–15.2)

## 2014-01-28 LAB — CULTURE, RESPIRATORY W GRAM STAIN

## 2014-01-28 MED ORDER — WARFARIN SODIUM 10 MG PO TABS
10.0000 mg | ORAL_TABLET | Freq: Once | ORAL | Status: AC
  Administered 2014-01-28: 10 mg
  Filled 2014-01-28 (×3): qty 1

## 2014-01-28 MED ORDER — FREE WATER
200.0000 mL | Freq: Four times a day (QID) | Status: DC
  Administered 2014-01-28 – 2014-02-02 (×19): 200 mL
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
## 2014-01-28 NOTE — Progress Notes (Signed)
Pharmacy Note-Anticoagulation  Pharmacy Consult :  33 y.o. female is currently on Coumadin with Lovenox bridging for RUE DVT.   Latest Labs : Hematology :  Recent Labs  01/26/14 0235 01/26/14 1642 01/27/14 0230 01/28/14 0323  HGB 14.1  --  12.0 11.4*  HCT 45.3  --  39.6 37.7  PLT 446*  --  341 246  LABPROT  --  13.7 13.7 13.7  INR  --  1.07 1.07 1.07  CREATININE 0.81  --  0.52 0.55    Lab Results  Component Value Date   INR 1.07 01/28/2014   INR 1.07 01/27/2014   INR 1.07 01/26/2014        HGB 11.4* 01/28/2014   HGB 12.0 01/27/2014   HGB 14.1 01/26/2014    Current Medication[s] Include: Scheduled:  Scheduled:  . antiseptic oral rinse  15 mL Mouth Rinse QID  . chlorhexidine  15 mL Mouth Rinse BID  . enoxaparin (LOVENOX) injection  1 mg/kg Subcutaneous Q12H  . free water  200 mL Per Tube Q6H  . glycopyrrolate  2 mg Per Tube BID  . ipratropium-albuterol  3 mL Nebulization Q6H  . pantoprazole sodium  40 mg Per Tube Daily  . potassium chloride  40 mEq Per Tube BID  . propranolol  40 mg Per Tube 4 times per day  . Warfarin - Pharmacist Dosing Inpatient   Does not apply q1800   Infusion[s]: Infusions:  . feeding supplement (JEVITY 1.2 CAL) 1,000 mL (01/28/14 0800)   Antibiotic[s]: Anti-infectives   Start     Dose/Rate Route Frequency Ordered Stop   01/26/14 0400  levofloxacin (LEVAQUIN) IVPB 750 mg  Status:  Discontinued     750 mg 100 mL/hr over 90 Minutes Intravenous Every 24 hours 01/26/14 0318 01/28/14 1037   01/16/14 1000  levofloxacin (LEVAQUIN) IVPB 750 mg  Status:  Discontinued     750 mg 100 mL/hr over 90 Minutes Intravenous Every 24 hours 01/16/14 0845 01/20/14 1156      Assessment :  Today's INR is unchanged x 3 days although patient has received ordered Coumadin doses.   INR is 1.07.    Lovenox bridging continuing until INR therapeutic.  No bleeding complications observed.  Levaquin discontinued today.  Goal :  INR goal is 2-3    Lovenox  Anti-Xa level 0.6-1 units/ml 4hrs after LMWH dose given as per MD..  Plan : 1. Lovenox bridging minimum of 5 days overlap with Coumadin, then an additional 24 - 48 hours overlap once INR therapeutic. 2. Coumadin 10 mg po today 3. Daily INR's, CBC. Monitor for bleeding complications  Estelle June, Pharm.D. 01/28/2014  11:57 AM

## 2014-01-28 NOTE — Progress Notes (Signed)
Seen and agree  

## 2014-01-28 NOTE — Clinical Social Work Note (Signed)
Clinical Education officer, museum met with patient mother/father, PA, RN, Development worker, community, and case manager regarding patient current medical status and plans for discharge.  CSW explained SNF option to patient mother and father with the possibilities of out of county placement again.  Patient parents are in full understanding that SNF would be a short term option with plans to take patient home and care for her at home.   CSW has contacted Market researcher (Dr. Reynaldo Minium) who will look into patient record and determine with CSW leadership the best disposition for patient at discharge.  Patient suctioning now at 6-8 hours and Dr. Reynaldo Minium aware.  CSW will remain available for support and to facilitate patient discharge needs once medically stable.  Barbette Or, [place]
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
## 2014-01-28 NOTE — Progress Notes (Signed)
Transported pt to [phone] with all belongings. Grandmother and aunt at bedside. Father, [name] updated on new room assignment. [name] was called with no answer. [name] stated he would relay the information. VSS.

## 2014-01-29 LAB — GLUCOSE, CAPILLARY
GLUCOSE-CAPILLARY: 129 mg/dL — AB (ref 70–99)
GLUCOSE-CAPILLARY: 106 mg/dL — AB (ref 70–99)
Glucose-Capillary: 113 mg/dL — ABNORMAL HIGH (ref 70–99)
Glucose-Capillary: 117 mg/dL — ABNORMAL HIGH (ref 70–99)
Glucose-Capillary: 120 mg/dL — ABNORMAL HIGH (ref 70–99)
Glucose-Capillary: 139 mg/dL — ABNORMAL HIGH (ref 70–99)

## 2014-01-29 LAB — BASIC METABOLIC PANEL
BUN: 39 mg/dL — AB (ref 6–23)
CO2: 28 mEq/L (ref 19–32)
CREATININE: 0.56 mg/dL (ref 0.50–1.10)
Calcium: 9.6 mg/dL (ref 8.4–10.5)
Chloride: 114 mEq/L — ABNORMAL HIGH (ref 96–112)
GFR calc Af Amer: >90 mL/min (ref >90)
GFR calc non Af Amer: >90 mL/min (ref >90)
Glucose, Bld: 99 mg/dL (ref 70–99)
POTASSIUM: 4.7 meq/L (ref 3.7–5.3)
Sodium: 153 mEq/L — ABNORMAL HIGH (ref 137–147)

## 2014-01-29 LAB — CBC
HEMATOCRIT: 39.1 % (ref 36.0–46.0)
Hemoglobin: 11.8 g/dL — ABNORMAL LOW (ref 12.0–15.0)
MCH: 27.3 pg (ref 26.0–34.0)
MCHC: 30.2 g/dL (ref 30.0–36.0)
MCV: 90.3 fL (ref 78.0–100.0)
Platelets: 231 10*3/uL (ref 150–400)
RBC: 4.33 MIL/uL (ref 3.87–5.11)
RDW: 18.2 % — ABNORMAL HIGH (ref 11.5–15.5)
WBC: 9.5 10*3/uL (ref 4.0–10.5)

## 2014-01-29 LAB — URINE CULTURE

## 2014-01-29 LAB — PROTIME-INR
INR: 1.06 (ref 0.00–1.49)
PROTHROMBIN TIME: 13.6 s (ref 11.6–15.2)

## 2014-01-29 MED ORDER — WARFARIN SODIUM 10 MG PO TABS
10.0000 mg | ORAL_TABLET | Freq: Once | ORAL | Status: AC
  Administered 2014-01-29: 10 mg
  Filled 2014-01-29: qty 1
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
## 2014-01-29 NOTE — Progress Notes (Signed)
Pharmacy Note-Anticoagulation  Pharmacy Consult :  33 y.o. female is currently on Coumadin with Lovenox bridging for RUE DVT.   Latest Labs : Hematology :  Recent Labs  01/26/14 0235 01/26/14 1642 01/27/14 0230 01/28/14 0323  HGB 14.1  --  12.0 11.4*  HCT 45.3  --  39.6 37.7  PLT 446*  --  341 246  LABPROT  --  13.7 13.7 13.7  INR  --  1.07 1.07 1.07  CREATININE 0.81  --  0.52 0.55    Lab Results  Component Value Date   INR 1.07 01/28/2014   INR 1.07 01/27/2014   INR 1.07 01/26/2014        HGB 11.4* 01/28/2014   HGB 12.0 01/27/2014   HGB 14.1 01/26/2014    Current Medication[s] Include: Scheduled:  Scheduled:  . antiseptic oral rinse  15 mL Mouth Rinse QID  . chlorhexidine  15 mL Mouth Rinse BID  . enoxaparin (LOVENOX) injection  1 mg/kg Subcutaneous Q12H  . free water  200 mL Per Tube Q6H  . glycopyrrolate  2 mg Per Tube BID  . ipratropium-albuterol  3 mL Nebulization Q6H  . pantoprazole sodium  40 mg Per Tube Daily  . propranolol  40 mg Per Tube 4 times per day  . Warfarin - Pharmacist Dosing Inpatient   Does not apply q1800   Infusion[s]: Infusions:  . feeding supplement (JEVITY 1.2 CAL) 1,000 mL (01/28/14 1231)   Antibiotic[s]: Anti-infectives   Start     Dose/Rate Route Frequency Ordered Stop   01/26/14 0400  levofloxacin (LEVAQUIN) IVPB 750 mg  Status:  Discontinued     750 mg 100 mL/hr over 90 Minutes Intravenous Every 24 hours 01/26/14 0318 01/28/14 1037   01/16/14 1000  levofloxacin (LEVAQUIN) IVPB 750 mg  Status:  Discontinued     750 mg 100 mL/hr over 90 Minutes Intravenous Every 24 hours 01/16/14 0845 01/20/14 1156      Assessment :  Today's INR is unchanged x 3 days although patient has received ordered Coumadin doses.   INR is 1.06   Lovenox bridging continuing until INR therapeutic.  No bleeding complications observed.  Goal :  INR goal is 2-3    Lovenox Anti-Xa level 0.6-1 units/ml 4hrs after LMWH dose given as per MD..  Plan  : 1. Lovenox bridging minimum of 5 days overlap with Coumadin, then an additional 24 - 48 hours overlap once INR therapeutic. 2. Coumadin 10 mg po today 3. Daily INR's, CBC. Monitor for bleeding complications  Evony Rezek P[place]et, Pharm.D. 01/29/2014  10:18 AM

## 2014-01-30 LAB — PROTIME-INR
INR: 1.15 (ref 0.00–1.49)
PROTHROMBIN TIME: 14.5 s (ref 11.6–15.2)

## 2014-01-30 LAB — BASIC METABOLIC PANEL
BUN: 41 mg/dL — AB (ref 6–23)
CALCIUM: 9.2 mg/dL (ref 8.4–10.5)
CO2: 29 mEq/L (ref 19–32)
Chloride: 112 mEq/L (ref 96–112)
Creatinine, Ser: 0.52 mg/dL (ref 0.50–1.10)
Glucose, Bld: 117 mg/dL — ABNORMAL HIGH (ref 70–99)
Potassium: 4.3 mEq/L (ref 3.7–5.3)
Sodium: 151 mEq/L — ABNORMAL HIGH (ref 137–147)

## 2014-01-30 LAB — PROCALCITONIN

## 2014-01-30 LAB — GLUCOSE, CAPILLARY
Glucose-Capillary: 108 mg/dL — ABNORMAL HIGH (ref 70–99)
Glucose-Capillary: 116 mg/dL — ABNORMAL HIGH (ref 70–99)

## 2014-01-30 MED ORDER — WARFARIN SODIUM 10 MG PO TABS
10.0000 mg | ORAL_TABLET | Freq: Once | ORAL | Status: AC
  Administered 2014-01-30: 10 mg
  Filled 2014-01-30: qty 1

## 2014-01-30 MED ORDER — WHITE PETROLATUM GEL
Status: AC
Start: 2014-01-30 — End: 2014-01-30
  Administered 2014-01-30: 07:00:00
  Filled 2014-01-30: qty 5
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
## 2014-01-30 NOTE — Progress Notes (Signed)
Pharmacy Note-Anticoagulation  Pharmacy Consult :  33 y.o. female is currently on Coumadin with Lovenox bridging for RUE DVT.   Latest Labs : Hematology :  Recent Labs  01/26/14 0235 01/26/14 1642 01/27/14 0230 01/28/14 0323  HGB 14.1  --  12.0 11.4*  HCT 45.3  --  39.6 37.7  PLT 446*  --  341 246  LABPROT  --  13.7 13.7 13.7  INR  --  1.07 1.07 1.07  CREATININE 0.81  --  0.52 0.55    Lab Results  Component Value Date   INR 1.07 01/28/2014   INR 1.07 01/27/2014   INR 1.07 01/26/2014        HGB 11.4* 01/28/2014   HGB 12.0 01/27/2014   HGB 14.1 01/26/2014    Current Medication[s] Include: Scheduled:  Scheduled:  . antiseptic oral rinse  15 mL Mouth Rinse QID  . chlorhexidine  15 mL Mouth Rinse BID  . enoxaparin (LOVENOX) injection  1 mg/kg Subcutaneous Q12H  . free water  200 mL Per Tube Q6H  . glycopyrrolate  2 mg Per Tube BID  . ipratropium-albuterol  3 mL Nebulization Q6H  . pantoprazole sodium  40 mg Per Tube Daily  . propranolol  40 mg Per Tube 4 times per day  . Warfarin - Pharmacist Dosing Inpatient   Does not apply q1800   Infusion[s]: Infusions:  . feeding supplement (JEVITY 1.2 CAL) 1,000 mL (01/29/14 2037)   Antibiotic[s]: Anti-infectives   Start     Dose/Rate Route Frequency Ordered Stop   01/26/14 0400  levofloxacin (LEVAQUIN) IVPB 750 mg  Status:  Discontinued     750 mg 100 mL/hr over 90 Minutes Intravenous Every 24 hours 01/26/14 0318 01/28/14 1037   01/16/14 1000  levofloxacin (LEVAQUIN) IVPB 750 mg  Status:  Discontinued     750 mg 100 mL/hr over 90 Minutes Intravenous Every 24 hours 01/16/14 0845 01/20/14 1156      Assessment :  Today's INR is slightly increased to 1.15.    Lovenox bridging continuing until INR therapeutic.  No bleeding complications observed.  Goal :  INR goal is 2-3    Lovenox Anti-Xa level 0.6-1 units/ml 4hrs after LMWH dose given as per MD..  Plan : 1. Lovenox bridging minimum of 5 days overlap with Coumadin,  then an additional 24 - 48 hours overlap once INR therapeutic. 2. Coumadin 10 mg po today.  Will increase dose tomorrow if little increase in INR 3. Daily INR's, CBC. Monitor for bleeding complications  [name], Pharm.D. 01/30/2014  8:36 AM

## 2014-01-30 NOTE — Progress Notes (Signed)
I have examined this patient and I agree with the assessment and treatment plan outlined by Mr. Dellis Filbert, [place]   Edsel Petrin. Dalbert Batman, M.D., [facility] General and Minimally invasive Surgery Breast and Colorectal Surgery Trauma Office:   [phone]

## 2014-01-31 LAB — BASIC METABOLIC PANEL
BUN: 36 mg/dL — ABNORMAL HIGH (ref 6–23)
CHLORIDE: 110 meq/L (ref 96–112)
CO2: 28 mEq/L (ref 19–32)
CREATININE: 0.51 mg/dL (ref 0.50–1.10)
Calcium: 9.4 mg/dL (ref 8.4–10.5)
GFR calc non Af Amer: >90 mL/min (ref >90)
Glucose, Bld: 101 mg/dL — ABNORMAL HIGH (ref 70–99)
Potassium: 4.3 mEq/L (ref 3.7–5.3)
Sodium: 150 mEq/L — ABNORMAL HIGH (ref 137–147)

## 2014-01-31 LAB — PROTIME-INR
INR: 1.22 (ref 0.00–1.49)
Prothrombin Time: 15.1 seconds (ref 11.6–15.2)

## 2014-01-31 MED ORDER — GLYCOPYRROLATE 1 MG PO TABS
1.0000 mg | ORAL_TABLET | Freq: Three times a day (TID) | ORAL | Status: DC
  Administered 2014-01-31 – 2014-02-01 (×6): 1 mg
  Filled 2014-01-31 (×9): qty 1

## 2014-01-31 MED ORDER — COUMADIN BOOK
Freq: Once | Status: AC
  Administered 2014-01-31: 19:00:00
  Filled 2014-01-31: qty 1

## 2014-01-31 MED ORDER — PROPRANOLOL HCL 20 MG/5ML PO SOLN
40.0000 mg | Freq: Three times a day (TID) | ORAL | Status: DC
  Administered 2014-01-31 – 2014-02-07 (×23): 40 mg
  Filled 2014-01-31 (×25): qty 10

## 2014-01-31 MED ORDER — WARFARIN VIDEO: Freq: Once | Status: DC

## 2014-01-31 MED ORDER — WARFARIN SODIUM 7.5 MG PO TABS
15.0000 mg | ORAL_TABLET | Freq: Once | ORAL | Status: AC
  Administered 2014-01-31: 15 mg via ORAL
  Filled 2014-01-31: qty 2

## 2014-01-31 MED ORDER — IBUPROFEN 100 MG/5ML PO SUSP
400.0000 mg | ORAL | Status: DC | PRN
Start: 2014-01-31 — End: 2014-02-07
  Administered 2014-02-03: 400 mg
  Filled 2014-01-31: qty 20

## 2014-01-31 NOTE — Progress Notes (Signed)
No real change in status.  This patient has been seen and I agree with the findings and treatment plan.  Kathryne Eriksson. Dahlia Bailiff, MD, [place] [phone] (pager) [phone] (direct pager) Trauma Surgeon
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
## 2014-01-31 NOTE — Clinical Social Work Note (Signed)
Clinical Social Worker continuing to follow patient and family for support and discharge planning needs.  CSW attempted to visit with patient family at bedside, however no one present at this time.  CSW received notification from [facility] that due to staffing concerns and patient high acuity of needs, placement is not appropriate at this time.  CSW has made follow up contact with [facility] in [place] regarding potential placement - DON to review this evening and will notify 02/01/2014.    CSW spoke with patient mother over the phone to provide current updates.  Patient mother is in complete understanding and hopeful for bed offer at [facility].  Patient mother plans to be present at bedside to complete Disability application tomorrow.  Patient mother states that she will provide follow up and plans with patient father this evening.  CSW remains available for support and to facilitate patient discharge needs once bed available.  Barbette Or, [place]
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
## 2014-01-31 NOTE — Progress Notes (Signed)
Chaplain provided ministry of presence. Family members at patient bedside. Healthcare provider present. Patient about to receive breathing treatment. Chaplain offered emotional and spiritual support. Family requested information on patient's condition. Chaplain consulted with unit nurse advised to have nurse make contact with family.   01/31/14 1400  Clinical Encounter Type  Visited With Family;Health care provider  Visit Type Initial
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
## 2014-02-01 DIAGNOSIS — I82409 Acute embolism and thrombosis of unspecified deep veins of unspecified lower extremity: Secondary | ICD-10-CM

## 2014-02-01 LAB — CBC
HEMATOCRIT: 35.2 % — AB (ref 36.0–46.0)
HEMOGLOBIN: 10.9 g/dL — AB (ref 12.0–15.0)
MCH: 27.9 pg (ref 26.0–34.0)
MCHC: 31 g/dL (ref 30.0–36.0)
MCV: 90.3 fL (ref 78.0–100.0)
Platelets: 109 10*3/uL — ABNORMAL LOW (ref 150–400)
RBC: 3.9 MIL/uL (ref 3.87–5.11)
RDW: 18.2 % — AB (ref 11.5–15.5)
WBC: 6.3 10*3/uL (ref 4.0–10.5)

## 2014-02-01 LAB — CULTURE, BLOOD (ROUTINE X 2)
CULTURE: NO GROWTH
Culture: NO GROWTH

## 2014-02-01 LAB — BASIC METABOLIC PANEL
BUN: 38 mg/dL — ABNORMAL HIGH (ref 6–23)
CHLORIDE: 109 meq/L (ref 96–112)
CO2: 27 mEq/L (ref 19–32)
Calcium: 9.2 mg/dL (ref 8.4–10.5)
Creatinine, Ser: 0.48 mg/dL — ABNORMAL LOW (ref 0.50–1.10)
GFR calc non Af Amer: >90 mL/min (ref >90)
Glucose, Bld: 107 mg/dL — ABNORMAL HIGH (ref 70–99)
POTASSIUM: 4.4 meq/L (ref 3.7–5.3)
SODIUM: 148 meq/L — AB (ref 137–147)

## 2014-02-01 LAB — APTT
aPTT: 46 s — ABNORMAL HIGH (ref 24–37)
aPTT: 47 s — ABNORMAL HIGH (ref 24–37)

## 2014-02-01 LAB — PROTIME-INR
INR: 1.22 (ref 0.00–1.49)
PROTHROMBIN TIME: 15.1 s (ref 11.6–15.2)

## 2014-02-01 MED ORDER — ARGATROBAN 50 MG/50ML IV SOLN
1.2100 ug/kg/min | INTRAVENOUS | Status: DC
  Administered 2014-02-01: 1 ug/kg/min via INTRAVENOUS
  Filled 2014-02-01: qty 50

## 2014-02-01 MED ORDER — ARGATROBAN 50 MG/50ML IV SOLN
2.0000 ug/kg/min | INTRAVENOUS | Status: DC
  Administered 2014-02-01: 1.21 ug/kg/min via INTRAVENOUS
  Administered 2014-02-02: 2 ug/kg/min via INTRAVENOUS
  Administered 2014-02-02: 1.47 ug/kg/min via INTRAVENOUS
  Filled 2014-02-01 (×4): qty 50

## 2014-02-01 NOTE — Clinical Social Work Note (Signed)
Clinical Social Worker continuing to follow patient and family for support and discharge planning needs.  CSW spoke with patient father at bedside and patient mother over the phone to provide update.  Patient parents understand that patient has been offered a bed at [facility] and will transition once MD determines medical readiness.  Patient parents are planning to meet Marylou Flesher ([phone]) on Friday to complete Disability paperwork - CSW has contacted him per family request to determine if meeting could be held at [facility], awaiting response.    Clinical Social Worker has provided all updates to admission coordinator at [facility] including patient tube feed formula and trach size for supplies.  Per MD, patient may be ready in 1-2 days - facility aware.  CSW has updated leadership and will facilitate patient discharge needs once medically ready.  CSW remains available for support as needed.  Barbette Or, [place]

## 2014-02-01 NOTE — Progress Notes (Signed)
Stable neuro exam. L gaze preference. Spont moves R fingers. Seems to blink to threat which I haven't seen her do yet. Secretions better. Decrease rubinal tomorrow. I spoke at length with her dad. D/C scalp staples.  Patient examined and I agree with the assessment and plan  Georganna Skeans, MD, MPH, FACS Pager: [phone]  02/01/2014 11:28 AM
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
## 2014-02-01 NOTE — Progress Notes (Signed)
Humidification bottle changed, new bottle and tubing. Hooked up to 21% air. Pt comfortable. Vitals stable.

## 2014-02-02 LAB — CBC
HCT: 35.5 % — ABNORMAL LOW (ref 36.0–46.0)
Hemoglobin: 11 g/dL — ABNORMAL LOW (ref 12.0–15.0)
MCH: 27.8 pg (ref 26.0–34.0)
MCHC: 31 g/dL (ref 30.0–36.0)
MCV: 89.6 fL (ref 78.0–100.0)
PLATELETS: 101 10*3/uL — AB (ref 150–400)
RBC: 3.96 MIL/uL (ref 3.87–5.11)
RDW: 17.9 % — ABNORMAL HIGH (ref 11.5–15.5)
WBC: 6.7 10*3/uL (ref 4.0–10.5)

## 2014-02-02 LAB — BASIC METABOLIC PANEL
BUN: 35 mg/dL — ABNORMAL HIGH (ref 6–23)
CALCIUM: 9.4 mg/dL (ref 8.4–10.5)
CO2: 27 mEq/L (ref 19–32)
Chloride: 110 mEq/L (ref 96–112)
Creatinine, Ser: 0.4 mg/dL — ABNORMAL LOW (ref 0.50–1.10)
GFR calc Af Amer: >90 mL/min (ref >90)
GFR calc non Af Amer: >90 mL/min (ref >90)
GLUCOSE: 111 mg/dL — AB (ref 70–99)
Potassium: 3.9 mEq/L (ref 3.7–5.3)
Sodium: 150 mEq/L — ABNORMAL HIGH (ref 137–147)

## 2014-02-02 LAB — PROTIME-INR
INR: 1.9 — ABNORMAL HIGH (ref 0.00–1.49)
Prothrombin Time: 21.2 s — ABNORMAL HIGH (ref 11.6–15.2)

## 2014-02-02 LAB — APTT
aPTT: 46 s — ABNORMAL HIGH (ref 24–37)
aPTT: 55 s — ABNORMAL HIGH (ref 24–37)
aPTT: 53 seconds — ABNORMAL HIGH (ref 24–37)

## 2014-02-02 MED ORDER — GLYCOPYRROLATE 1 MG PO TABS
1.0000 mg | ORAL_TABLET | Freq: Two times a day (BID) | ORAL | Status: DC
  Administered 2014-02-02 – 2014-02-07 (×11): 1 mg
  Filled 2014-02-02 (×12): qty 1

## 2014-02-02 MED ORDER — FREE WATER
200.0000 mL | Status: DC
  Administered 2014-02-02 – 2014-02-07 (×27): 200 mL

## 2014-02-02 MED ORDER — ARGATROBAN 50 MG/50ML IV SOLN
2.2000 ug/kg/min | INTRAVENOUS | Status: DC
  Administered 2014-02-02 – 2014-02-03 (×3): 2.2 ug/kg/min via INTRAVENOUS
  Filled 2014-02-02 (×3): qty 50

## 2014-02-02 NOTE — Clinical Social Work Note (Signed)
Clinical Social Worker continuing to follow patient family for support and discharge planning needs.  CSW has provided all updates to admission coordinator at [facility] including patient tube feed formula and trach size for supplies. Per MD, patient sodium has increased and platelets are low due to Lovenox, therefore she is not medically ready for discharge.  CSW has updated leadership and will facilitate patient discharge needs once medically ready. CSW remains available for support as needed.   Barbette Or, [place]
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
## 2014-02-02 NOTE — Progress Notes (Signed)
UR completed.  Yavier Snider, RN BSN MHA CCM Trauma/Neuro ICU Case Manager [phone]  
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
## 2014-02-02 NOTE — Progress Notes (Signed)
Answered the family's questions.  Possibly will restart coumadin.    This patient has been seen and I agree with the findings and treatment plan.  Kathryne Eriksson. Dahlia Bailiff, MD, [place] [phone] (pager) [phone] (direct pager) Trauma Surgeon

## 2014-02-02 NOTE — Progress Notes (Signed)
ANTICOAGULATION CONSULT NOTE  Pharmacy Consult:  Argatroban Indication:  RUE DVT  Allergies  Allergen Reactions  . Ace Inhibitors   . Penicillins Other (See Comments)    Unknown    Patient Measurements: Height: 5\' 5"  (165.1 cm) Weight: 150 lb 5.7 oz (68.2 kg) IBW/kg (Calculated) : 57  Vital Signs: Temp: 99.2 F (37.3 C) (02/24 2147) Temp src: Oral (02/24 2147) BP: 146/69 mmHg (02/24 2147) Pulse Rate: 75 (02/25 0050)  Labs:  Recent Labs  01/31/14 0630 02/01/14 0513 02/01/14 1500 02/01/14 2110 02/02/14 0139 02/02/14 0200  HGB  --  10.9*  --   --  11.0*  --   HCT  --  35.2*  --   --  35.5*  --   PLT  --  109*  --   --  101*  --   APTT  --   --  46* 47*  --  46*  LABPROT 15.1 15.1  --   --   --  21.2*  INR 1.22 1.22  --   --   --  1.90*  CREATININE 0.51 0.48*  --   --  0.40*  --     Estimated Creatinine Clearance: 90 ml/min (by C-G formula based on Cr of 0.4).  Assessment: 33 YO Female with RUE DVT for argatroban  Goal of Therapy:  APTT 50-90 sec  Plan:  Increase argatroban 2 mcg/kg/min Follow-up am labs.  Phillis Knack, PharmD, BCPS
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
## 2014-02-03 ENCOUNTER — Encounter: Payer: Self-pay | Admitting: Internal Medicine

## 2014-02-03 DIAGNOSIS — D696 Thrombocytopenia, unspecified: Secondary | ICD-10-CM

## 2014-02-03 LAB — CBC
HCT: 37.3 % (ref 36.0–46.0)
HEMOGLOBIN: 11.6 g/dL — AB (ref 12.0–15.0)
MCH: 27.6 pg (ref 26.0–34.0)
MCHC: 31.1 g/dL (ref 30.0–36.0)
MCV: 88.8 fL (ref 78.0–100.0)
PLATELETS: 125 10*3/uL — AB (ref 150–400)
RBC: 4.2 MIL/uL (ref 3.87–5.11)
RDW: 18.2 % — ABNORMAL HIGH (ref 11.5–15.5)
WBC: 6.9 10*3/uL (ref 4.0–10.5)

## 2014-02-03 LAB — BASIC METABOLIC PANEL
BUN: 33 mg/dL — ABNORMAL HIGH (ref 6–23)
CHLORIDE: 110 meq/L (ref 96–112)
CO2: 25 meq/L (ref 19–32)
CREATININE: 0.47 mg/dL — AB (ref 0.50–1.10)
Calcium: 9.1 mg/dL (ref 8.4–10.5)
GFR calc non Af Amer: >90 mL/min (ref >90)
Glucose, Bld: 105 mg/dL — ABNORMAL HIGH (ref 70–99)
Potassium: 4.4 mEq/L (ref 3.7–5.3)
SODIUM: 148 meq/L — AB (ref 137–147)

## 2014-02-03 LAB — PROTIME-INR
INR: 1.74 — AB (ref 0.00–1.49)
PROTHROMBIN TIME: 19.8 s — AB (ref 11.6–15.2)

## 2014-02-03 LAB — APTT
aPTT: 46 seconds — ABNORMAL HIGH (ref 24–37)
aPTT: 54 seconds — ABNORMAL HIGH (ref 24–37)

## 2014-02-03 MED ORDER — ARGATROBAN 50 MG/50ML IV SOLN
2.6400 ug/kg/min | INTRAVENOUS | Status: DC
  Administered 2014-02-03: 2.64 ug/kg/min via INTRAVENOUS
  Filled 2014-02-03 (×2): qty 50

## 2014-02-03 MED ORDER — ARGATROBAN 50 MG/50ML IV SOLN
3.0400 ug/kg/min | INTRAVENOUS | Status: AC
  Administered 2014-02-03 – 2014-02-05 (×11): 3.04 ug/kg/min via INTRAVENOUS
  Filled 2014-02-03 (×13): qty 50
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
## 2014-02-03 NOTE — Progress Notes (Signed)
Tracheal suctioned patient. Suctioned small amount of white, thick secretions. Patient tolerated well, sat 97%.

## 2014-02-03 NOTE — Progress Notes (Signed)
Changed humidity bottle, ATC.  Vitals remained stable.  Sat 99%.

## 2014-02-03 NOTE — Progress Notes (Signed)
Her father was changing the controls on the cooling blanket. I strongly advised him that he is not to alter any medical devices. I also spoke to her nurse about this situation. No neuro changes. Await HIT panel. PLTs up some. Patient examined and I agree with the assessment and plan  Georganna Skeans, MD, MPH, FACS Trauma: [phone] General Surgery: [phone]  02/03/2014 11:20 AM

## 2014-02-03 NOTE — Progress Notes (Signed)
Have been in room frequently oral care , repositioned and  peri care done when in cont,, assessed trach and 02  , no changes in neuro noted ,tolferating tube feedings , skin care only facial abrasions noted mepilex dsg on sacrum area bilateral foot boots on and cooling blanket in place

## 2014-02-03 NOTE — Clinical Social Work Note (Signed)
Clinical Social Worker spoke with patient mother over the phone to offer continued support and further discuss patient plans at discharge.  Patient mother remains in agreement with placement at [facility].  CSW has left a message with facility regarding patient need for continued hospitalization.  Patient mother requesting contact from MD regarding PICC line placement - CSW to update MD regarding request.  CSW remains available for support and to facilitate patient discharge needs once medically ready.  Barbette Or, [place]
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
## 2014-02-04 LAB — CBC
HCT: 36.4 % (ref 36.0–46.0)
Hemoglobin: 11.5 g/dL — ABNORMAL LOW (ref 12.0–15.0)
MCH: 28.1 pg (ref 26.0–34.0)
MCHC: 31.6 g/dL (ref 30.0–36.0)
MCV: 89 fL (ref 78.0–100.0)
Platelets: 127 10*3/uL — ABNORMAL LOW (ref 150–400)
RBC: 4.09 MIL/uL (ref 3.87–5.11)
RDW: 17.9 % — AB (ref 11.5–15.5)
WBC: 7.2 10*3/uL (ref 4.0–10.5)

## 2014-02-04 LAB — BASIC METABOLIC PANEL
BUN: 31 mg/dL — AB (ref 6–23)
CHLORIDE: 108 meq/L (ref 96–112)
CO2: 26 mEq/L (ref 19–32)
CREATININE: 0.46 mg/dL — AB (ref 0.50–1.10)
Calcium: 9.3 mg/dL (ref 8.4–10.5)
GFR calc Af Amer: >90 mL/min (ref >90)
Glucose, Bld: 120 mg/dL — ABNORMAL HIGH (ref 70–99)
POTASSIUM: 4.2 meq/L (ref 3.7–5.3)
Sodium: 147 mEq/L (ref 137–147)

## 2014-02-04 LAB — PROTIME-INR
INR: 2.03 — ABNORMAL HIGH (ref 0.00–1.49)
PROTHROMBIN TIME: 22.3 s — AB (ref 11.6–15.2)

## 2014-02-04 LAB — HEPARIN INDUCED THROMBOCYTOPENIA PNL
HEPARIN INDUCED PLT AB: NEGATIVE
PATIENT O. D.: 0.063
UFH High Dose UFH H: 97 %
UFH LOW DOSE 0.5 IU/ML: 98 %
UFH Low Dose 0.1 IU/mL: 100 %

## 2014-02-04 LAB — APTT: APTT: 60 s — AB (ref 24–37)

## 2014-02-04 MED ORDER — WARFARIN - PHARMACIST DOSING INPATIENT: Freq: Every day | Status: DC

## 2014-02-04 MED ORDER — WARFARIN VIDEO: Freq: Once | Status: DC

## 2014-02-04 MED ORDER — WARFARIN SODIUM 5 MG PO TABS
5.0000 mg | ORAL_TABLET | Freq: Once | ORAL | Status: AC
  Administered 2014-02-04: 5 mg via ORAL
  Filled 2014-02-04: qty 1

## 2014-02-04 MED ORDER — PATIENT'S GUIDE TO USING COUMADIN BOOK
Freq: Once | Status: AC
  Administered 2014-02-05: 16:00:00
  Filled 2014-02-04: qty 1

## 2014-02-04 NOTE — Progress Notes (Signed)
Trauma Service Note  Subjective: Patient is the same.  Father at the bedside.   Objective: Vital signs in last 24 hours: Temp:  [99.7 F (37.6 C)-101.8 F (38.8 C)] 100.3 F (37.9 C) (02/27 0639) Pulse Rate:  [77-96] 90 (02/27 0904) Resp:  [16-18] 16 (02/27 0904) BP: (125-126)/(62-76) 126/62 mmHg (02/27 0639) SpO2:  [98 %-100 %] 99 % (02/27 0904) FiO2 (%):  [21 %-28 %] 28 % (02/27 0904) Weight:  [59.194 kg (130 lb 8 oz)-63.504 kg (140 lb)] 63.504 kg (140 lb) (02/27 0500) Last BM Date: 02/03/14  Intake/Output from previous day: 02/26 0701 - 02/27 0700 In: 917.7 [I.V.:130.2; NG/GT:787.5] Out: -  Intake/Output this shift:    General: No movement, no responsiveness.  Lungs: Clear.  Secretions are controlled.  Abd: Soft, good bowel sounds.  Tolerating tube feedings well.  Extremities: No changes  Neuro: Eyes open, will not blink to command.  Lab Results: CBC   Recent Labs  02/03/14 0545 02/04/14 0340  WBC 6.9 7.2  HGB 11.6* 11.5*  HCT 37.3 36.4  PLT 125* 127*   BMET  Recent Labs  02/03/14 0545 02/04/14 0340  NA 148* 147  K 4.4 4.2  CL 110 108  CO2 25 26  GLUCOSE 105* 120*  BUN 33* 31*  CREATININE 0.47* 0.46*  CALCIUM 9.1 9.3   PT/INR  Recent Labs  02/03/14 0545 02/04/14 0340  LABPROT 19.8* 22.3*  INR 1.74* 2.03*   ABG No results found for this basename: PHART, PCO2, PO2, HCO3,  in the last 72 hours  Studies/Results: No results found.  Anti-infectives: Anti-infectives   Start     Dose/Rate Route Frequency Ordered Stop   01/26/14 0400  levofloxacin (LEVAQUIN) IVPB 750 mg  Status:  Discontinued     750 mg 100 mL/hr over 90 Minutes Intravenous Every 24 hours 01/26/14 0318 01/28/14 1037   01/16/14 1000  levofloxacin (LEVAQUIN) IVPB 750 mg  Status:  Discontinued     750 mg 100 mL/hr over 90 Minutes Intravenous Every 24 hours 01/16/14 0845 01/20/14 1156   01/12/14 0800  ciprofloxacin (CIPRO) IVPB 400 mg     400 mg 200 mL/hr over 60 Minutes  Intravenous On call 01/12/14 0748 01/12/14 0924   01/05/14 1400  clindamycin (CLEOCIN) IVPB 600 mg     600 mg 100 mL/hr over 30 Minutes Intravenous 3 times per day 01/05/14 0855 01/06/14 2138   01/05/14 1100  gentamicin (GARAMYCIN) IVPB 60 mg     60 mg 100 mL/hr over 30 Minutes Intravenous Every 8 hours 01/05/14 0947 01/07/14 0251   01/05/14 0600  clindamycin (CLEOCIN) IVPB 300 mg  Status:  Discontinued     300 mg 100 mL/hr over 30 Minutes Intravenous 3 times per day 01/05/14 0332 01/05/14 0855   01/04/14 2115  clindamycin (CLEOCIN) IVPB 600 mg  Status:  Discontinued     600 mg 100 mL/hr over 30 Minutes Intravenous  Once 01/04/14 2104 01/07/14 0841   01/04/14 2108  clindamycin (CLEOCIN) 600 MG/50ML IVPB    Comments:  [name]   : cabinet override      01/04/14 2108 01/04/14 2120      Assessment/Plan: s/p Procedure(s): PERCUTANEOUS TRACHEOSTOMY May discontinue cooling blanket.   LOS: 31 days   Kathryne Eriksson. Dahlia Bailiff, MD, FACS [phone] Trauma Surgeon 02/04/2014
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
## 2014-02-04 NOTE — Clinical Social Work Note (Addendum)
9:36am- CSW received a return call from [place] at [facility].  Tressie Ellis reports that pt will need trach supplies upon dc.  9:18am- CSW left a message for Tressie Ellis at [facility] to inform projected dc Monday.  Nonnie Done, [place] [phone]  Clinical Social Work

## 2014-02-04 NOTE — Discharge Instructions (Signed)
Information on my medicine - Coumadin®   (Warfarin) ° °This medication education was reviewed with me or my healthcare representative as part of my discharge preparation.  The pharmacist that spoke with me during my hospital stay was:  Azaela Caracci T, RPH ° °Why was Coumadin prescribed for you? °Coumadin was prescribed for you because you have a blood clot or a medical condition that can cause an increased risk of forming blood clots. Blood clots can cause serious health problems by blocking the flow of blood to the heart, lung, or brain. Coumadin can prevent harmful blood clots from forming. °As a reminder your indication for Coumadin is:   Deep Vein Thrombosis Treatment ° °What test will check on my response to Coumadin? °While on Coumadin (warfarin) you will need to have an INR test regularly to ensure that your dose is keeping you in the desired range. The INR (international normalized ratio) number is calculated from the result of the laboratory test called prothrombin time (PT). ° °If an INR APPOINTMENT HAS NOT ALREADY BEEN MADE FOR YOU please schedule an appointment to have this lab work done by your health care provider within 7 days. °Your INR goal is usually a number between:  2 to 3 or your provider may give you a more narrow range like 2-2.5.  Ask your health care provider during an office visit what your goal INR is. ° °What  do you need to  know  About  COUMADIN? °Take Coumadin (warfarin) exactly as prescribed by your healthcare provider about the same time each day.  DO NOT stop taking without talking to the doctor who prescribed the medication.  Stopping without other blood clot prevention medication to take the place of Coumadin may increase your risk of developing a new clot or stroke.  Get refills before you run out. ° °What do you do if you miss a dose? °If you miss a dose, take it as soon as you remember on the same day then continue your regularly scheduled regimen the next day.  Do not take  two doses of Coumadin at the same time. ° °Important Safety Information °A possible side effect of Coumadin (Warfarin) is an increased risk of bleeding. You should call your healthcare provider right away if you experience any of the following: °? Bleeding from an injury or your nose that does not stop. °? Unusual colored urine (red or dark brown) or unusual colored stools (red or black). °? Unusual bruising for unknown reasons. °? A serious fall or if you hit your head (even if there is no bleeding). ° °Some foods or medicines interact with Coumadin® (warfarin) and might alter your response to warfarin. To help avoid this: °? Eat a balanced diet, maintaining a consistent amount of Vitamin K. °? Notify your provider about major diet changes you plan to make. °? Avoid alcohol or limit your intake to 1 drink for women and 2 drinks for men per day. °(1 drink is 5 oz. wine, 12 oz. beer, or 1.5 oz. liquor.) ° °Make sure that ANY health care provider who prescribes medication for you knows that you are taking Coumadin (warfarin).  Also make sure the healthcare provider who is monitoring your Coumadin knows when you have started a new medication including herbals and non-prescription products. ° °Coumadin® (Warfarin)  Major Drug Interactions  °Increased Warfarin Effect Decreased Warfarin Effect  °Alcohol (large quantities) °Antibiotics (esp. Septra/Bactrim, Flagyl, Cipro) °Amiodarone (Cordarone) °Aspirin (ASA) °Cimetidine (Tagamet) °Megestrol (Megace) °NSAIDs (ibuprofen, naproxen, etc.) °  Tagamet) Megestrol (Megace) NSAIDs (ibuprofen, naproxen, etc.) Piroxicam (Feldene) Propafenone (Rythmol SR) Propranolol (Inderal) Isoniazid (INH) Posaconazole (Noxafil) Barbiturates (Phenobarbital) Carbamazepine (Tegretol) Chlordiazepoxide (Librium) Cholestyramine (Questran) Griseofulvin Oral Contraceptives Rifampin Sucralfate (Carafate) Vitamin K   Coumadin (Warfarin) Major Herbal Interactions  Increased Warfarin Effect Decreased Warfarin Effect  Garlic Ginseng Ginkgo biloba  Coenzyme Q10 Green tea St. Johns wort    Coumadin (Warfarin) FOOD Interactions  Eat a consistent number of servings per week of foods HIGH in Vitamin K (1 serving =  cup)  Collards (cooked, or boiled & drained) Kale (cooked, or boiled & drained) Mustard greens (cooked, or boiled & drained) Parsley *serving size only =  cup Spinach (cooked, or boiled & drained) Swiss chard (cooked, or boiled & drained) Turnip greens (cooked, or boiled & drained)  Eat a consistent number of servings per week of foods MEDIUM-HIGH in Vitamin K (1 serving = 1 cup)  Asparagus (cooked, or boiled & drained) Broccoli (cooked, boiled & drained, or raw & chopped) Brussel sprouts (cooked, or boiled & drained) *serving size only =  cup Lettuce, raw (green leaf, endive, romaine) Spinach, raw Turnip greens, raw & chopped   These websites have more information on Coumadin (warfarin):  FailFactory.se; VeganReport.com.au;
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
## 2014-02-05 LAB — CBC
HEMATOCRIT: 36.5 % (ref 36.0–46.0)
Hemoglobin: 11.5 g/dL — ABNORMAL LOW (ref 12.0–15.0)
MCH: 27.8 pg (ref 26.0–34.0)
MCHC: 31.5 g/dL (ref 30.0–36.0)
MCV: 88.4 fL (ref 78.0–100.0)
Platelets: 132 10*3/uL — ABNORMAL LOW (ref 150–400)
RBC: 4.13 MIL/uL (ref 3.87–5.11)
RDW: 17.8 % — ABNORMAL HIGH (ref 11.5–15.5)
WBC: 6.8 10*3/uL (ref 4.0–10.5)

## 2014-02-05 LAB — PROTIME-INR
INR: 1.78 — ABNORMAL HIGH (ref 0.00–1.49)
Prothrombin Time: 20.2 seconds — ABNORMAL HIGH (ref 11.6–15.2)

## 2014-02-05 LAB — APTT: aPTT: 59 s — ABNORMAL HIGH (ref 24–37)

## 2014-02-05 MED ORDER — WARFARIN SODIUM 10 MG PO TABS
10.0000 mg | ORAL_TABLET | Freq: Once | ORAL | Status: AC
  Administered 2014-02-05: 10 mg via ORAL
  Filled 2014-02-05: qty 1

## 2014-02-05 MED ORDER — FONDAPARINUX SODIUM 7.5 MG/0.6ML ~~LOC~~ SOLN
7.5000 mg | SUBCUTANEOUS | Status: DC
  Administered 2014-02-05 – 2014-02-07 (×3): 7.5 mg via SUBCUTANEOUS
  Filled 2014-02-05 (×3): qty 0.6

## 2014-02-05 MED ORDER — FONDAPARINUX SODIUM 7.5 MG/0.6ML ~~LOC~~ SOLN
7.5000 mg | Freq: Every day | SUBCUTANEOUS | Status: DC
  Filled 2014-02-05: qty 0.6
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
## 2014-02-05 NOTE — Progress Notes (Signed)
24 Days Post-Op  Subjective: HIT panel is negative Coumadin/ Arixtra per pharmacy   Objective: Vital signs in last 24 hours: Temp:  [98.8 F (37.1 C)-100.9 F (38.3 C)] 98.8 F (37.1 C) (02/28 0453) Pulse Rate:  [75-87] 80 (02/28 0453) Resp:  [16-22] 16 (02/28 0453) BP: (140-145)/(72-81) 145/72 mmHg (02/28 0453) SpO2:  [98 %-100 %] 100 % (02/28 0754) FiO2 (%):  [28 %] 28 % (02/28 0754) Weight:  [149 lb (67.586 kg)] 149 lb (67.586 kg) (02/28 0500) Last BM Date: 02/04/14  Intake/Output from previous day: 02/27 0701 - 02/28 0700 In: 1653.2 [I.V.:303.2; NG/GT:1350] Out: -  Intake/Output this shift:   General: No movement, no responsiveness.  Lungs: Clear. Secretions are controlled.  Abd: Soft, good bowel sounds. Tolerating tube feedings well.  Extremities: No changes  Neuro: Eyes open, will not blink to command.   Lab Results:   Recent Labs  02/04/14 0340 02/05/14 0350  WBC 7.2 6.8  HGB 11.5* 11.5*  HCT 36.4 36.5  PLT 127* 132*   BMET  Recent Labs  02/03/14 0545 02/04/14 0340  NA 148* 147  K 4.4 4.2  CL 110 108  CO2 25 26  GLUCOSE 105* 120*  BUN 33* 31*  CREATININE 0.47* 0.46*  CALCIUM 9.1 9.3   PT/INR  Recent Labs  02/04/14 0340 02/05/14 0350  LABPROT 22.3* 20.2*  INR 2.03* 1.78*   ABG No results found for this basename: PHART, PCO2, PO2, HCO3,  in the last 72 hours  Studies/Results: No results found.  Anti-infectives: Anti-infectives   Start     Dose/Rate Route Frequency Ordered Stop   01/26/14 0400  levofloxacin (LEVAQUIN) IVPB 750 mg  Status:  Discontinued     750 mg 100 mL/hr over 90 Minutes Intravenous Every 24 hours 01/26/14 0318 01/28/14 1037   01/16/14 1000  levofloxacin (LEVAQUIN) IVPB 750 mg  Status:  Discontinued     750 mg 100 mL/hr over 90 Minutes Intravenous Every 24 hours 01/16/14 0845 01/20/14 1156   01/12/14 0800  ciprofloxacin (CIPRO) IVPB 400 mg     400 mg 200 mL/hr over 60 Minutes Intravenous On call 01/12/14 0748  01/12/14 0924   01/05/14 1400  clindamycin (CLEOCIN) IVPB 600 mg     600 mg 100 mL/hr over 30 Minutes Intravenous 3 times per day 01/05/14 0855 01/06/14 2138   01/05/14 1100  gentamicin (GARAMYCIN) IVPB 60 mg     60 mg 100 mL/hr over 30 Minutes Intravenous Every 8 hours 01/05/14 0947 01/07/14 0251   01/05/14 0600  clindamycin (CLEOCIN) IVPB 300 mg  Status:  Discontinued     300 mg 100 mL/hr over 30 Minutes Intravenous 3 times per day 01/05/14 0332 01/05/14 0855   01/04/14 2115  clindamycin (CLEOCIN) IVPB 600 mg  Status:  Discontinued     600 mg 100 mL/hr over 30 Minutes Intravenous  Once 01/04/14 2104 01/07/14 0841   01/04/14 2108  clindamycin (CLEOCIN) 600 MG/50ML IVPB    Comments:  Haynes Bast   : cabinet override      01/04/14 2108 01/04/14 2120      Assessment/Plan: s/p Procedure(s) with comments: PERCUTANEOUS TRACHEOSTOMY (N/A) - BEDSIDE TRACH Placement next week hopefully once anticoagulation adequate  LOS: 32 days    Shantese Raven K. 02/05/2014

## 2014-02-06 LAB — CBC
HEMATOCRIT: 36.5 % (ref 36.0–46.0)
HEMOGLOBIN: 11.5 g/dL — AB (ref 12.0–15.0)
MCH: 27.8 pg (ref 26.0–34.0)
MCHC: 31.5 g/dL (ref 30.0–36.0)
MCV: 88.4 fL (ref 78.0–100.0)
Platelets: 175 10*3/uL (ref 150–400)
RBC: 4.13 MIL/uL (ref 3.87–5.11)
RDW: 17.7 % — AB (ref 11.5–15.5)
WBC: 7.5 10*3/uL (ref 4.0–10.5)

## 2014-02-06 LAB — PROTIME-INR
INR: 1.04 (ref 0.00–1.49)
Prothrombin Time: 13.4 seconds (ref 11.6–15.2)

## 2014-02-06 MED ORDER — WARFARIN SODIUM 2.5 MG PO TABS
12.5000 mg | ORAL_TABLET | Freq: Once | ORAL | Status: AC
  Administered 2014-02-06: 12.5 mg
  Filled 2014-02-06 (×2): qty 1
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
## 2014-02-07 LAB — PROTIME-INR
INR: 0.99 (ref 0.00–1.49)
Prothrombin Time: 12.9 seconds (ref 11.6–15.2)

## 2014-02-07 MED ORDER — FONDAPARINUX SODIUM 7.5 MG/0.6ML ~~LOC~~ SOLN: 7.5000 mg | SUBCUTANEOUS | Status: DC

## 2014-02-07 MED ORDER — ACETAMINOPHEN 160 MG/5ML PO SOLN: 650.0000 mg | ORAL | Status: DC | PRN

## 2014-02-07 MED ORDER — PROPRANOLOL HCL 20 MG/5ML PO SOLN: 40.0000 mg | Freq: Three times a day (TID) | ORAL | Status: DC

## 2014-02-07 MED ORDER — PANTOPRAZOLE SODIUM 40 MG PO PACK: 40.0000 mg | PACK | Freq: Every day | ORAL | Status: DC

## 2014-02-07 MED ORDER — FREE WATER: 200.0000 mL | Status: DC

## 2014-02-07 MED ORDER — JEVITY 1.2 CAL PO LIQD: 1000.0000 mL | ORAL | Status: DC

## 2014-02-07 MED ORDER — GLYCOPYRROLATE 1 MG PO TABS: 1.0000 mg | ORAL_TABLET | Freq: Two times a day (BID) | ORAL | Status: DC

## 2014-02-07 MED ORDER — WARFARIN SODIUM 7.5 MG PO TABS: ORAL_TABLET | ORAL | Status: DC

## 2014-02-07 MED ORDER — WARFARIN SODIUM 7.5 MG PO TABS
15.0000 mg | ORAL_TABLET | Freq: Once | ORAL | Status: DC
  Filled 2014-02-07: qty 2

## 2014-02-07 MED ORDER — IBUPROFEN 100 MG/5ML PO SUSP: 400.0000 mg | ORAL | Status: DC | PRN
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
## 2014-02-07 NOTE — Clinical Social Work Note (Addendum)
Clinical Social Worker facilitated patient discharge including contacting patient family and facility to confirm patient discharge plans.  Clinical information faxed to facility and family agreeable with plan.  CSW arranged ambulance transport via [facility] to [facility]  RN to call report prior to discharge.  Bedside tracheostomy supplies sent with patient at discharge.  No SBIRT completed during this admission due to patient continued level of consciousness.  No concerns verbalized by patient family at this time.  Clinical Social Worker will sign off for now as social work intervention is no longer needed. Please consult [place] again if new need arises.  Barbette Or, [place]
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
## 2014-02-07 NOTE — Progress Notes (Signed)
Discharge Note. Report was called to SNF prior to transport arriving. All questions were answered. Pt's family at the bedside. Pt's mother has requested that other visitors not be given information about pt's transfer due to a large amount of visitors. Non-emergent ambulance transportation arrived and taking pt to SNF.

## 2014-02-07 NOTE — Progress Notes (Signed)
I spoke with her mother at length at the bedside. Hopeful to transfer to SNF today. Will need Arixtra just until warfarin therapeutic. Tracks but not F/C. Spont movement R hand. Patient examined and I agree with the assessment and plan  Georganna Skeans, MD, MPH, FACS Trauma: [phone] General Surgery: [phone]  02/07/2014 9:45 AM

## 2014-02-08 LAB — COMPREHENSIVE METABOLIC PANEL
ALK PHOS: 147 U/L — AB
AST: 34 U/L (ref 15–37)
Albumin: 2.9 g/dL — ABNORMAL LOW (ref 3.4–5.0)
Anion Gap: 9 (ref 7–16)
BUN: 24 mg/dL — ABNORMAL HIGH (ref 7–18)
Bilirubin,Total: 0.2 mg/dL (ref 0.2–1.0)
CO2: 27 mmol/L (ref 21–32)
Calcium, Total: 8.6 mg/dL (ref 8.5–10.1)
Chloride: 102 mmol/L (ref 98–107)
Creatinine: 0.35 mg/dL — ABNORMAL LOW (ref 0.60–1.30)
EGFR (Non-African Amer.): >60
GLUCOSE: 107 mg/dL — AB (ref 65–99)
OSMOLALITY: 280 (ref 275–301)
Potassium: 4.5 mmol/L (ref 3.5–5.1)
SGPT (ALT): 25 U/L (ref 12–78)
Sodium: 138 mmol/L (ref 136–145)
Total Protein: 6.5 g/dL (ref 6.4–8.2)

## 2014-02-08 LAB — CBC WITH DIFFERENTIAL/PLATELET
BASOS PCT: 0.7 %
Basophil #: 0.1 10*3/uL (ref 0.0–0.1)
EOS PCT: 2.1 %
Eosinophil #: 0.2 10*3/uL (ref 0.0–0.7)
HCT: 35.1 % (ref 35.0–47.0)
HGB: 11.6 g/dL — ABNORMAL LOW (ref 12.0–16.0)
Lymphocyte #: 0.8 10*3/uL — ABNORMAL LOW (ref 1.0–3.6)
Lymphocyte %: 11.1 %
MCH: 28.3 pg (ref 26.0–34.0)
MCHC: 33.1 g/dL (ref 32.0–36.0)
MCV: 86 fL (ref 80–100)
MONO ABS: 0.4 x10 3/mm (ref 0.2–0.9)
Monocyte %: 5.3 %
NEUTROS PCT: 80.8 %
Neutrophil #: 6 10*3/uL (ref 1.4–6.5)
PLATELETS: 145 10*3/uL — AB (ref 150–440)
RBC: 4.1 10*6/uL (ref 3.80–5.20)
RDW: 19.5 % — ABNORMAL HIGH (ref 11.5–14.5)
WBC: 7.5 10*3/uL (ref 3.6–11.0)

## 2014-02-08 LAB — PROTIME-INR
INR: 1.1
PROTHROMBIN TIME: 14.1 s (ref 11.5–14.7)

## 2014-02-08 LAB — CBC
HEMATOCRIT: 37.4 % (ref 36.0–46.0)
HEMOGLOBIN: 12 g/dL (ref 12.0–15.0)
MCH: 28 pg (ref 26.0–34.0)
MCHC: 32.1 g/dL (ref 30.0–36.0)
MCV: 87.2 fL (ref 78.0–100.0)
Platelets: 129 10*3/uL — ABNORMAL LOW (ref 150–400)
RBC: 4.29 MIL/uL (ref 3.87–5.11)
RDW: 17.6 % — AB (ref 11.5–15.5)
WBC: 7.1 10*3/uL (ref 4.0–10.5)

## 2014-02-08 NOTE — Discharge Summary (Signed)
Teddie Curd, MD, MPH, FACS Trauma: [phone] General Surgery: [phone]  

## 2014-02-09 ENCOUNTER — Inpatient Hospital Stay: Payer: Self-pay | Admitting: Internal Medicine

## 2014-02-09 LAB — CBC WITH DIFFERENTIAL/PLATELET
BASOS ABS: 0.1 10*3/uL (ref 0.0–0.1)
Basophil %: 1.1 %
Eosinophil #: 0.1 10*3/uL (ref 0.0–0.7)
Eosinophil %: 1.8 %
HCT: 35.6 % (ref 35.0–47.0)
HGB: 11.8 g/dL — ABNORMAL LOW (ref 12.0–16.0)
LYMPHS PCT: 16.7 %
Lymphocyte #: 1.3 10*3/uL (ref 1.0–3.6)
MCH: 28.5 pg (ref 26.0–34.0)
MCHC: 33.1 g/dL (ref 32.0–36.0)
MCV: 86 fL (ref 80–100)
Monocyte #: 0.6 x10 3/mm (ref 0.2–0.9)
Monocyte %: 7.3 %
Neutrophil #: 5.8 10*3/uL (ref 1.4–6.5)
Neutrophil %: 73.1 %
PLATELETS: 152 10*3/uL (ref 150–440)
RBC: 4.14 10*6/uL (ref 3.80–5.20)
RDW: 19.5 % — AB (ref 11.5–14.5)
WBC: 7.9 10*3/uL (ref 3.6–11.0)

## 2014-02-09 LAB — MAGNESIUM: Magnesium: 1.9 mg/dL

## 2014-02-09 LAB — LIPASE, BLOOD: LIPASE: 345 U/L (ref 73–393)

## 2014-02-10 LAB — CBC WITH DIFFERENTIAL/PLATELET
Basophil #: 0.1 10*3/uL (ref 0.0–0.1)
Basophil %: 0.8 %
EOS PCT: 2.1 %
Eosinophil #: 0.2 10*3/uL (ref 0.0–0.7)
HCT: 36.2 % (ref 35.0–47.0)
HGB: 12 g/dL (ref 12.0–16.0)
LYMPHS PCT: 15.7 %
Lymphocyte #: 1.1 10*3/uL (ref 1.0–3.6)
MCH: 28.7 pg (ref 26.0–34.0)
MCHC: 33.3 g/dL (ref 32.0–36.0)
MCV: 86 fL (ref 80–100)
MONOS PCT: 6.9 %
Monocyte #: 0.5 x10 3/mm (ref 0.2–0.9)
Neutrophil #: 5.4 10*3/uL (ref 1.4–6.5)
Neutrophil %: 74.5 %
Platelet: 134 10*3/uL — ABNORMAL LOW (ref 150–440)
RBC: 4.19 10*6/uL (ref 3.80–5.20)
RDW: 18.9 % — ABNORMAL HIGH (ref 11.5–14.5)
WBC: 7.3 10*3/uL (ref 3.6–11.0)

## 2014-02-10 LAB — COMPREHENSIVE METABOLIC PANEL
ALBUMIN: 3 g/dL — AB (ref 3.4–5.0)
ALT: 22 U/L (ref 12–78)
ANION GAP: 5 — AB (ref 7–16)
AST: 26 U/L (ref 15–37)
Alkaline Phosphatase: 130 U/L — ABNORMAL HIGH
BILIRUBIN TOTAL: 0.3 mg/dL (ref 0.2–1.0)
BUN: 24 mg/dL — AB (ref 7–18)
CALCIUM: 9 mg/dL (ref 8.5–10.1)
CO2: 28 mmol/L (ref 21–32)
Chloride: 103 mmol/L (ref 98–107)
Creatinine: 0.47 mg/dL — ABNORMAL LOW (ref 0.60–1.30)
EGFR (African American): >60
GLUCOSE: 99 mg/dL (ref 65–99)
Osmolality: 276 (ref 275–301)
Potassium: 4 mmol/L (ref 3.5–5.1)
Sodium: 136 mmol/L (ref 136–145)
TOTAL PROTEIN: 6.9 g/dL (ref 6.4–8.2)

## 2014-02-10 LAB — BASIC METABOLIC PANEL
ANION GAP: 4 — AB (ref 7–16)
BUN: 24 mg/dL — ABNORMAL HIGH (ref 7–18)
CHLORIDE: 104 mmol/L (ref 98–107)
CREATININE: 0.49 mg/dL — AB (ref 0.60–1.30)
Calcium, Total: 9.3 mg/dL (ref 8.5–10.1)
Co2: 30 mmol/L (ref 21–32)
EGFR (African American): >60
EGFR (Non-African Amer.): >60
GLUCOSE: 109 mg/dL — AB (ref 65–99)
OSMOLALITY: 280 (ref 275–301)
Potassium: 3.9 mmol/L (ref 3.5–5.1)
Sodium: 138 mmol/L (ref 136–145)

## 2014-02-10 LAB — PROTIME-INR
INR: 1.1
Prothrombin Time: 14.5 secs (ref 11.5–14.7)

## 2014-02-10 LAB — APTT: Activated PTT: 33.2 secs (ref 23.6–35.9)

## 2014-02-11 LAB — HEMOGLOBIN: HGB: 10.8 g/dL — AB (ref 12.0–16.0)

## 2014-02-11 LAB — OCCULT BLOOD X 1 CARD TO LAB, STOOL: OCCULT BLOOD, FECES: POSITIVE

## 2014-02-12 LAB — HEMOGLOBIN
HGB: 11.2 g/dL — AB (ref 12.0–16.0)
HGB: 11 g/dL — AB (ref 12.0–16.0)

## 2014-02-12 LAB — WOUND AEROBIC CULTURE

## 2014-02-13 LAB — HEMOGLOBIN: HGB: 11.1 g/dL — ABNORMAL LOW (ref 12.0–16.0)

## 2014-02-14 LAB — HEMOGLOBIN: HGB: 12 g/dL (ref 12.0–16.0)

## 2014-02-15 ENCOUNTER — Encounter: Payer: Self-pay | Admitting: Internal Medicine

## 2014-02-15 LAB — CBC WITH DIFFERENTIAL/PLATELET
BASOS PCT: 0.4 %
Basophil #: 0 10*3/uL (ref 0.0–0.1)
EOS ABS: 0.1 10*3/uL (ref 0.0–0.7)
EOS PCT: 0.8 %
HCT: 37.8 % (ref 35.0–47.0)
HGB: 11.8 g/dL — AB (ref 12.0–16.0)
LYMPHS ABS: 1 10*3/uL (ref 1.0–3.6)
Lymphocyte %: 9.1 %
MCH: 27.1 pg (ref 26.0–34.0)
MCHC: 31.2 g/dL — ABNORMAL LOW (ref 32.0–36.0)
MCV: 87 fL (ref 80–100)
MONO ABS: 0.9 x10 3/mm (ref 0.2–0.9)
Monocyte %: 8.1 %
NEUTROS ABS: 9.2 10*3/uL — AB (ref 1.4–6.5)
NEUTROS PCT: 81.6 %
PLATELETS: 209 10*3/uL (ref 150–440)
RBC: 4.37 10*6/uL (ref 3.80–5.20)
RDW: 18.2 % — AB (ref 11.5–14.5)
WBC: 11.3 10*3/uL — ABNORMAL HIGH (ref 3.6–11.0)

## 2014-02-15 LAB — COMPREHENSIVE METABOLIC PANEL
ALBUMIN: 3 g/dL — AB (ref 3.4–5.0)
ALK PHOS: 123 U/L — AB
ALT: 21 U/L (ref 12–78)
ANION GAP: 7 (ref 7–16)
BUN: 21 mg/dL — ABNORMAL HIGH (ref 7–18)
Bilirubin,Total: 0.3 mg/dL (ref 0.2–1.0)
CHLORIDE: 103 mmol/L (ref 98–107)
Calcium, Total: 9.5 mg/dL (ref 8.5–10.1)
Co2: 29 mmol/L (ref 21–32)
Creatinine: 0.47 mg/dL — ABNORMAL LOW (ref 0.60–1.30)
EGFR (Non-African Amer.): >60
Glucose: 131 mg/dL — ABNORMAL HIGH (ref 65–99)
Osmolality: 282 (ref 275–301)
Potassium: 3.7 mmol/L (ref 3.5–5.1)
SGOT(AST): 16 U/L (ref 15–37)
Sodium: 139 mmol/L (ref 136–145)
TOTAL PROTEIN: 7.2 g/dL (ref 6.4–8.2)

## 2014-02-15 LAB — EXPECTORATED SPUTUM ASSESSMENT W REFEX TO RESP CULTURE

## 2014-02-16 ENCOUNTER — Ambulatory Visit: Payer: Self-pay | Admitting: Internal Medicine

## 2014-02-16 LAB — URINALYSIS, COMPLETE
BILIRUBIN, UR: NEGATIVE
Bacteria: NONE SEEN
Blood: NEGATIVE
GLUCOSE, UR: NEGATIVE mg/dL (ref 0–75)
Ketone: NEGATIVE
LEUKOCYTE ESTERASE: NEGATIVE
NITRITE: NEGATIVE
PROTEIN: NEGATIVE
Ph: 6 (ref 4.5–8.0)
Specific Gravity: 1.018 (ref 1.003–1.030)
WBC UR: 6 /HPF (ref 0–5)

## 2014-02-18 LAB — CBC WITH DIFFERENTIAL/PLATELET
BASOS PCT: 1 %
Basophil #: 0.1 10*3/uL (ref 0.0–0.1)
EOS PCT: 2.2 %
Eosinophil #: 0.2 10*3/uL (ref 0.0–0.7)
HCT: 36.5 % (ref 35.0–47.0)
HGB: 11.9 g/dL — AB (ref 12.0–16.0)
Lymphocyte #: 1.5 10*3/uL (ref 1.0–3.6)
Lymphocyte %: 18.7 %
MCH: 28 pg (ref 26.0–34.0)
MCHC: 32.5 g/dL (ref 32.0–36.0)
MCV: 86 fL (ref 80–100)
MONO ABS: 0.8 x10 3/mm (ref 0.2–0.9)
Monocyte %: 9.3 %
NEUTROS ABS: 5.6 10*3/uL (ref 1.4–6.5)
Neutrophil %: 68.8 %
PLATELETS: 183 10*3/uL (ref 150–440)
RBC: 4.25 10*6/uL (ref 3.80–5.20)
RDW: 17.3 % — AB (ref 11.5–14.5)
WBC: 8.2 10*3/uL (ref 3.6–11.0)

## 2014-02-18 LAB — URINE CULTURE

## 2014-02-20 LAB — CULTURE, BLOOD (SINGLE)

## 2014-02-21 LAB — CBC WITH DIFFERENTIAL/PLATELET
Basophil #: 0.1 10*3/uL (ref 0.0–0.1)
Basophil %: 1 %
Eosinophil #: 0.2 10*3/uL (ref 0.0–0.7)
Eosinophil %: 1.7 %
HCT: 35.3 % (ref 35.0–47.0)
HGB: 11.6 g/dL — ABNORMAL LOW (ref 12.0–16.0)
Lymphocyte #: 1.9 10*3/uL (ref 1.0–3.6)
Lymphocyte %: 19.7 %
MCH: 28.3 pg (ref 26.0–34.0)
MCHC: 32.9 g/dL (ref 32.0–36.0)
MCV: 86 fL (ref 80–100)
MONO ABS: 0.7 x10 3/mm (ref 0.2–0.9)
Monocyte %: 7.3 %
NEUTROS PCT: 70.3 %
Neutrophil #: 6.8 10*3/uL — ABNORMAL HIGH (ref 1.4–6.5)
Platelet: 141 10*3/uL — ABNORMAL LOW (ref 150–440)
RBC: 4.1 10*6/uL (ref 3.80–5.20)
RDW: 17.6 % — ABNORMAL HIGH (ref 11.5–14.5)
WBC: 9.6 10*3/uL (ref 3.6–11.0)

## 2014-02-22 ENCOUNTER — Ambulatory Visit: Payer: Self-pay | Admitting: Internal Medicine

## 2014-02-22 ENCOUNTER — Inpatient Hospital Stay: Payer: Self-pay | Admitting: Internal Medicine

## 2014-02-22 LAB — BASIC METABOLIC PANEL
Anion Gap: 2 — ABNORMAL LOW (ref 7–16)
BUN: 24 mg/dL — ABNORMAL HIGH (ref 7–18)
CALCIUM: 9.3 mg/dL (ref 8.5–10.1)
CHLORIDE: 107 mmol/L (ref 98–107)
CO2: 32 mmol/L (ref 21–32)
CREATININE: 0.46 mg/dL — AB (ref 0.60–1.30)
EGFR (African American): >60
EGFR (Non-African Amer.): >60
Glucose: 107 mg/dL — ABNORMAL HIGH (ref 65–99)
OSMOLALITY: 286 (ref 275–301)
POTASSIUM: 4.1 mmol/L (ref 3.5–5.1)
Sodium: 141 mmol/L (ref 136–145)

## 2014-02-22 LAB — HCG, QUANTITATIVE, PREGNANCY

## 2014-02-22 LAB — CBC
HCT: 37.2 % (ref 35.0–47.0)
HGB: 11.8 g/dL — AB (ref 12.0–16.0)
MCH: 27.3 pg (ref 26.0–34.0)
MCHC: 31.7 g/dL — ABNORMAL LOW (ref 32.0–36.0)
MCV: 86 fL (ref 80–100)
Platelet: 148 10*3/uL — ABNORMAL LOW (ref 150–440)
RBC: 4.33 10*6/uL (ref 3.80–5.20)
RDW: 17.8 % — ABNORMAL HIGH (ref 11.5–14.5)
WBC: 11.3 10*3/uL — AB (ref 3.6–11.0)

## 2014-02-22 LAB — URINALYSIS, COMPLETE
BILIRUBIN, UR: NEGATIVE
Blood: NEGATIVE
Glucose,UR: NEGATIVE mg/dL (ref 0–75)
Ketone: NEGATIVE
Leukocyte Esterase: NEGATIVE
Nitrite: NEGATIVE
PH: 8 (ref 4.5–8.0)
Protein: NEGATIVE
SPECIFIC GRAVITY: 1.015 (ref 1.003–1.030)

## 2014-02-23 LAB — CBC WITH DIFFERENTIAL/PLATELET
BASOS ABS: 0 10*3/uL (ref 0.0–0.1)
Basophil %: 0.5 %
EOS PCT: 2.2 %
Eosinophil #: 0.2 10*3/uL (ref 0.0–0.7)
HCT: 32.8 % — ABNORMAL LOW (ref 35.0–47.0)
HGB: 10.6 g/dL — AB (ref 12.0–16.0)
LYMPHS ABS: 1.6 10*3/uL (ref 1.0–3.6)
Lymphocyte %: 20.8 %
MCH: 27.8 pg (ref 26.0–34.0)
MCHC: 32.5 g/dL (ref 32.0–36.0)
MCV: 86 fL (ref 80–100)
MONO ABS: 0.6 x10 3/mm (ref 0.2–0.9)
MONOS PCT: 8.3 %
NEUTROS ABS: 5.2 10*3/uL (ref 1.4–6.5)
Neutrophil %: 68.2 %
PLATELETS: 133 10*3/uL — AB (ref 150–440)
RBC: 3.83 10*6/uL (ref 3.80–5.20)
RDW: 17.4 % — ABNORMAL HIGH (ref 11.5–14.5)
WBC: 7.6 10*3/uL (ref 3.6–11.0)

## 2014-02-23 LAB — BASIC METABOLIC PANEL
Anion Gap: 5 — ABNORMAL LOW (ref 7–16)
BUN: 28 mg/dL — ABNORMAL HIGH (ref 7–18)
CALCIUM: 8.8 mg/dL (ref 8.5–10.1)
CHLORIDE: 108 mmol/L — AB (ref 98–107)
CO2: 28 mmol/L (ref 21–32)
Creatinine: 0.5 mg/dL — ABNORMAL LOW (ref 0.60–1.30)
EGFR (African American): >60
EGFR (Non-African Amer.): >60
Glucose: 91 mg/dL (ref 65–99)
Osmolality: 286 (ref 275–301)
Potassium: 3.5 mmol/L (ref 3.5–5.1)
Sodium: 141 mmol/L (ref 136–145)

## 2014-02-24 LAB — VANCOMYCIN, TROUGH: Vancomycin, Trough: 14 ug/mL (ref 10–20)

## 2014-02-25 LAB — URINE CULTURE

## 2014-02-25 LAB — WBC: WBC: 8.2 10*3/uL (ref 3.6–11.0)

## 2014-02-25 LAB — CREATININE, SERUM
CREATININE: 0.47 mg/dL — AB (ref 0.60–1.30)
EGFR (African American): >60

## 2014-02-27 LAB — CULTURE, BLOOD (SINGLE)

## 2014-03-01 ENCOUNTER — Ambulatory Visit: Payer: Self-pay | Admitting: Neurology

## 2014-03-02 ENCOUNTER — Encounter: Payer: Self-pay | Admitting: Internal Medicine

## 2014-03-09 ENCOUNTER — Encounter: Payer: Self-pay | Admitting: Internal Medicine

## 2014-03-09 DEATH — deceased

## 2014-04-08 ENCOUNTER — Encounter: Payer: Self-pay | Admitting: Internal Medicine

## 2014-04-14 ENCOUNTER — Encounter: Payer: Self-pay | Admitting: *Deleted

## 2014-04-14 ENCOUNTER — Other Ambulatory Visit (HOSPITAL_COMMUNITY): Payer: Self-pay | Admitting: Physician Assistant
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
## 2014-04-15 ENCOUNTER — Encounter (HOSPITAL_COMMUNITY): Payer: Self-pay | Admitting: *Deleted

## 2014-04-15 ENCOUNTER — Inpatient Hospital Stay (HOSPITAL_COMMUNITY)
Admission: RE | Admit: 2014-04-15 | Discharge: 2014-05-11 | DRG: 945 | Disposition: A | Discharge status: Home Health | Payer: No Typology Code available for payment source | Source: Intra-hospital | Attending: Physical Medicine & Rehabilitation | Admitting: Physical Medicine & Rehabilitation

## 2014-04-15 ENCOUNTER — Other Ambulatory Visit (HOSPITAL_COMMUNITY): Payer: Self-pay | Admitting: Physician Assistant

## 2014-04-15 DIAGNOSIS — S5290XD Unspecified fracture of unspecified forearm, subsequent encounter for closed fracture with routine healing: Secondary | ICD-10-CM

## 2014-04-15 DIAGNOSIS — S2242XA Multiple fractures of ribs, left side, initial encounter for closed fracture: Secondary | ICD-10-CM

## 2014-04-15 DIAGNOSIS — Z7901 Long term (current) use of anticoagulants: Secondary | ICD-10-CM

## 2014-04-15 DIAGNOSIS — Z931 Gastrostomy status: Secondary | ICD-10-CM

## 2014-04-15 DIAGNOSIS — R1310 Dysphagia, unspecified: Secondary | ICD-10-CM

## 2014-04-15 DIAGNOSIS — M24529 Contracture, unspecified elbow: Secondary | ICD-10-CM | POA: Diagnosis present

## 2014-04-15 DIAGNOSIS — S069XAA Unspecified intracranial injury with loss of consciousness status unknown, initial encounter: Secondary | ICD-10-CM

## 2014-04-15 DIAGNOSIS — S069X9A Unspecified intracranial injury with loss of consciousness of unspecified duration, initial encounter: Secondary | ICD-10-CM

## 2014-04-15 DIAGNOSIS — S069X0S Unspecified intracranial injury without loss of consciousness, sequela: Secondary | ICD-10-CM

## 2014-04-15 DIAGNOSIS — Z930 Tracheostomy status: Secondary | ICD-10-CM

## 2014-04-15 DIAGNOSIS — R259 Unspecified abnormal involuntary movements: Secondary | ICD-10-CM | POA: Diagnosis present

## 2014-04-15 DIAGNOSIS — Z5189 Encounter for other specified aftercare: Principal | ICD-10-CM

## 2014-04-15 DIAGNOSIS — J9600 Acute respiratory failure, unspecified whether with hypoxia or hypercapnia: Secondary | ICD-10-CM

## 2014-04-15 DIAGNOSIS — IMO0001 Reserved for inherently not codable concepts without codable children: Secondary | ICD-10-CM

## 2014-04-15 DIAGNOSIS — F068 Other specified mental disorders due to known physiological condition: Secondary | ICD-10-CM | POA: Diagnosis present

## 2014-04-15 DIAGNOSIS — S42309B Unspecified fracture of shaft of humerus, unspecified arm, initial encounter for open fracture: Secondary | ICD-10-CM

## 2014-04-15 DIAGNOSIS — S42009A Fracture of unspecified part of unspecified clavicle, initial encounter for closed fracture: Secondary | ICD-10-CM | POA: Diagnosis present

## 2014-04-15 DIAGNOSIS — S42309D Unspecified fracture of shaft of humerus, unspecified arm, subsequent encounter for fracture with routine healing: Secondary | ICD-10-CM

## 2014-04-15 DIAGNOSIS — S5290XA Unspecified fracture of unspecified forearm, initial encounter for closed fracture: Secondary | ICD-10-CM | POA: Diagnosis present

## 2014-04-15 DIAGNOSIS — S02109A Fracture of base of skull, unspecified side, initial encounter for closed fracture: Secondary | ICD-10-CM | POA: Diagnosis present

## 2014-04-15 DIAGNOSIS — R4589 Other symptoms and signs involving emotional state: Secondary | ICD-10-CM | POA: Diagnosis present

## 2014-04-15 DIAGNOSIS — F411 Generalized anxiety disorder: Secondary | ICD-10-CM | POA: Diagnosis present

## 2014-04-15 DIAGNOSIS — IMO0002 Reserved for concepts with insufficient information to code with codable children: Secondary | ICD-10-CM

## 2014-04-15 DIAGNOSIS — S06300A Unspecified focal traumatic brain injury without loss of consciousness, initial encounter: Secondary | ICD-10-CM

## 2014-04-15 DIAGNOSIS — I82509 Chronic embolism and thrombosis of unspecified deep veins of unspecified lower extremity: Secondary | ICD-10-CM | POA: Diagnosis present

## 2014-04-15 DIAGNOSIS — G8250 Quadriplegia, unspecified: Secondary | ICD-10-CM

## 2014-04-15 DIAGNOSIS — F17200 Nicotine dependence, unspecified, uncomplicated: Secondary | ICD-10-CM | POA: Diagnosis present

## 2014-04-15 DIAGNOSIS — S3609XA Other injury of spleen, initial encounter: Secondary | ICD-10-CM | POA: Diagnosis present

## 2014-04-15 MED ORDER — FERROUS SULFATE 220 (44 FE) MG/5ML PO ELIX
220.0000 mg | ORAL_SOLUTION | Freq: Two times a day (BID) | ORAL | Status: DC
  Filled 2014-04-15 (×2): qty 5

## 2014-04-15 MED ORDER — ACETAMINOPHEN 325 MG PO TABS
325.0000 mg | ORAL_TABLET | ORAL | Status: DC | PRN
  Administered 2014-04-17 – 2014-04-18 (×4): 650 mg
  Filled 2014-04-15 (×4): qty 2

## 2014-04-15 MED ORDER — FERROUS SULFATE 300 (60 FE) MG/5ML PO SYRP
300.0000 mg | ORAL_SOLUTION | Freq: Two times a day (BID) | ORAL | Status: DC
  Administered 2014-04-15 – 2014-05-11 (×52): 300 mg via ORAL
  Filled 2014-04-15 (×55): qty 5

## 2014-04-15 MED ORDER — JEVITY 1.5 CAL/FIBER PO LIQD
1000.0000 mL | ORAL | Status: DC
  Administered 2014-04-15 – 2014-05-03 (×17): 1000 mL
  Filled 2014-04-15 (×36): qty 1000

## 2014-04-15 MED ORDER — CARBAMAZEPINE 100 MG/5ML PO SUSP
200.0000 mg | Freq: Three times a day (TID) | ORAL | Status: DC
  Administered 2014-04-15 – 2014-04-26 (×33): 200 mg via ORAL
  Filled 2014-04-15 (×36): qty 10

## 2014-04-15 MED ORDER — JEVITY 1.5 CAL/FIBER PO LIQD
1000.0000 mL | ORAL | Status: DC
  Administered 2014-04-15: 1000 mL
  Filled 2014-04-15 (×2): qty 1000

## 2014-04-15 MED ORDER — QUETIAPINE FUMARATE 50 MG PO TABS
50.0000 mg | ORAL_TABLET | Freq: Every day | ORAL | Status: DC
  Administered 2014-04-15 – 2014-04-18 (×4): 50 mg via ORAL
  Filled 2014-04-15 (×5): qty 1

## 2014-04-15 MED ORDER — ONDANSETRON HCL 4 MG/2ML IJ SOLN: 4.0000 mg | Freq: Four times a day (QID) | INTRAMUSCULAR | Status: DC | PRN

## 2014-04-15 MED ORDER — BACLOFEN 5 MG HALF TABLET
5.0000 mg | ORAL_TABLET | Freq: Three times a day (TID) | ORAL | Status: DC
  Administered 2014-04-15: 5 mg
  Filled 2014-04-15 (×3): qty 1

## 2014-04-15 MED ORDER — BACLOFEN 10 MG PO TABS
10.0000 mg | ORAL_TABLET | Freq: Three times a day (TID) | ORAL | Status: DC
  Administered 2014-04-15 – 2014-04-19 (×11): 10 mg
  Filled 2014-04-15 (×15): qty 1

## 2014-04-15 MED ORDER — PANTOPRAZOLE SODIUM 40 MG PO PACK
40.0000 mg | PACK | Freq: Every day | ORAL | Status: DC
  Administered 2014-04-16 – 2014-05-11 (×26): 40 mg
  Filled 2014-04-15 (×28): qty 20

## 2014-04-15 MED ORDER — SORBITOL 70 % SOLN: 30.0000 mL | Freq: Every day | Status: DC | PRN

## 2014-04-15 MED ORDER — PROPRANOLOL HCL 20 MG/5ML PO SOLN
20.0000 mg | Freq: Three times a day (TID) | ORAL | Status: DC
  Administered 2014-04-15 – 2014-05-11 (×79): 20 mg
  Filled 2014-04-15 (×83): qty 5

## 2014-04-15 MED ORDER — FREE WATER
30.0000 mL | Status: DC
  Administered 2014-04-15 – 2014-04-20 (×104): 30 mL

## 2014-04-15 MED ORDER — TRAMADOL HCL 50 MG PO TABS
50.0000 mg | ORAL_TABLET | Freq: Four times a day (QID) | ORAL | Status: DC | PRN
  Administered 2014-04-15 – 2014-05-10 (×25): 50 mg
  Filled 2014-04-15 (×25): qty 1

## 2014-04-15 MED ORDER — TRAMADOL 5 MG/ML ORAL SUSPENSION
50.0000 mg | Freq: Four times a day (QID) | ORAL | Status: DC | PRN
  Filled 2014-04-15: qty 10

## 2014-04-15 MED ORDER — LORAZEPAM 0.5 MG PO TABS
0.5000 mg | ORAL_TABLET | Freq: Four times a day (QID) | ORAL | Status: DC | PRN
  Administered 2014-04-15 – 2014-05-08 (×6): 0.5 mg
  Filled 2014-04-15 (×6): qty 1

## 2014-04-15 MED ORDER — CARBAMAZEPINE 100 MG/5ML PO SUSP
100.0000 mg | Freq: Four times a day (QID) | ORAL | Status: DC
  Filled 2014-04-15 (×3): qty 5

## 2014-04-15 MED ORDER — ONDANSETRON HCL 4 MG PO TABS: 4.0000 mg | ORAL_TABLET | Freq: Four times a day (QID) | ORAL | Status: DC | PRN
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
## 2014-04-15 NOTE — Progress Notes (Signed)
INITIAL NUTRITION ASSESSMENT  DOCUMENTATION CODES Per approved criteria  -Not Applicable   INTERVENTION: 1.  Enteral nutrition; Initiate Jevity 1.5 @ 60 ml/hr x 20 hours daily. (Anticipate TF to be held 4 hours daily for participation in therapy.) Increase by 10 ml every 4 hours to goal rate of 75 ml/hr x 20 hours daily. 2.  Free water; flush tube with 30 mL q hrs x 20 hrs daily.    NUTRITION DIAGNOSIS: Inadequate oral intake related to inability to eat, dysphagia as evidenced by TF dependent.   Monitor:  1.  Enteral nutrition; initiation with tolerance.  Pt to meet >/=90% estimated needs with nutrition support.  2.  Wt/wt change; monitor trends  Reason for Assessment: consult; initiation and management of TF  33 y.o. female  Admitting Dx: rehab  ASSESSMENT: Pt admitted with deconditioning s/p TBI.  She has trach/PEG.  Her home regimen is Jevity 1.5 @ 60 mL/hr continuous with 25 mL free water flushes q hr.  Mother at bedside states that pt has been tolerating her home regimen well.  She mentions that it took a while to get TF and bowel regimen stable.  Home regimen: Jevity 1.5 @ 60 mL/hr with 25 mL free water flushes q hr provides 2160 kcal, 91g protein, and 1694 mL free water.  Will plan to continue same regimen.  Discussed increasing infusion rate for times disconnected from pump during therapy.  Mother agreeable.   Height: Ht Readings from Last 1 Encounters:  04/14/14 5\' 5"  (1.651 m)    Weight: Wt Readings from Last 1 Encounters:  04/14/14 125 lb (56.7 kg)    Ideal Body Weight: 125 lbs  % Ideal Body Weight: 100%  Wt Readings from Last 10 Encounters:  04/14/14 125 lb (56.7 kg)  02/05/14 149 lb (67.586 kg)  02/05/14 149 lb (67.586 kg)  02/05/14 149 lb (67.586 kg)  02/05/14 149 lb (67.586 kg)    Usual Body Weight: unknown  BMI:  There is no weight on file to calculate BMI.  Estimated Nutritional Needs: Kcal: 2000-2200 Protein: 90-105g Fluid: ~2.0  L/d  Skin: intact  Diet Order:  NPO  EDUCATION NEEDS: -Education needs addressed  No intake or output data in the 24 hours ending 04/15/14 1540  Last BM: PTA  Labs:  No results found for this basename: NA, K, CL, CO2, BUN, CREATININE, CALCIUM, MG, PHOS, GLUCOSE,  in the last 168 hours  CBG (last 3)  No results found for this basename: GLUCAP,  in the last 72 hours  Scheduled Meds: . baclofen  5 mg Per Tube TID  . carBAMazepine  100 mg Oral 4 times per day  . ferrous sulfate  300 mg Oral BID WC  . [START ON 04/16/2014] pantoprazole sodium  40 mg Per Tube Daily  . propranolol  20 mg Per Tube TID    Continuous Infusions: . feeding supplement (JEVITY 1.5 CAL/FIBER) 1,000 mL (04/15/14 1452)    History reviewed. No pertinent past medical history.  Past Surgical History  Procedure Laterality Date  . Abdominal hysterectomy    . Orif ulnar fracture Left 01/04/2014    Procedure: OPEN REDUCTION INTERNAL FIXATION (ORIF) ULNAR FRACTURE;  Surgeon: Alta Corning, MD;  Location: [place];  Service: Orthopedics;  Laterality: Left;  . Orif humerus fracture Left 01/04/2014    Procedure: OPEN REDUCTION INTERNAL FIXATION (ORIF) HUMERAL SHAFT FRACTURE;  Surgeon: Alta Corning, MD;  Location: [place];  Service: Orthopedics;  Laterality: Left;  . Orif radial fracture  Left 01/04/2014    Procedure: OPEN REDUCTION INTERNAL FIXATION (ORIF) RADIAL FRACTURE;  Surgeon: Alta Corning, MD;  Location: [place];  Service: Orthopedics;  Laterality: Left;  . Orif elbow fracture Left 01/04/2014    Procedure: OPEN REDUCTION INTERNAL FIXATION (ORIF) ELBOW/OLECRANON FRACTURE;  Surgeon: Alta Corning, MD;  Location: [place];  Service: Orthopedics;  Laterality: Left;  . Peg placement N/A 01/12/2014    Procedure: PERCUTANEOUS ENDOSCOPIC GASTROSTOMY (PEG) PLACEMENT;  Surgeon: Gwenyth Ober, MD;  Location: [place];  Service: General;  Laterality: N/A;    bedside trach and peg  . Percutaneous tracheostomy N/A 01/12/2014     Procedure: PERCUTANEOUS TRACHEOSTOMY;  Surgeon: Gwenyth Ober, MD;  Location: [place];  Service: General;  Laterality: N/A;  BEDSIDE TRACH    Brynda Greathouse, MS RD LDN Clinical Inpatient Dietitian Pager: [phone] Weekend/After hours pager: [phone]
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
## 2014-04-15 NOTE — Progress Notes (Signed)
Orthopedic Tech Progress Note Patient Details:  [name] 1981-01-30 [id number]  Ortho Devices Type of Ortho Device: Abdominal binder Ortho Device/Splint Interventions: Application   [name] 04/15/2014, 3:12 PM

## 2014-04-15 NOTE — Progress Notes (Signed)
Patient arrived via Care link. Attempt made to orient patient to room. Patient's parents arrived given admission information and oriented to room and rehab. All questions answered.

## 2014-04-16 ENCOUNTER — Inpatient Hospital Stay (HOSPITAL_COMMUNITY): Payer: No Typology Code available for payment source | Admitting: Speech Pathology

## 2014-04-16 ENCOUNTER — Inpatient Hospital Stay (HOSPITAL_COMMUNITY): Payer: No Typology Code available for payment source

## 2014-04-16 DIAGNOSIS — S2249XA Multiple fractures of ribs, unspecified side, initial encounter for closed fracture: Secondary | ICD-10-CM

## 2014-04-16 DIAGNOSIS — S069X9A Unspecified intracranial injury with loss of consciousness of unspecified duration, initial encounter: Secondary | ICD-10-CM

## 2014-04-16 DIAGNOSIS — S069XAA Unspecified intracranial injury with loss of consciousness status unknown, initial encounter: Secondary | ICD-10-CM

## 2014-04-16 LAB — MRSA PCR SCREENING: MRSA by PCR: NEGATIVE
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
## 2014-04-16 NOTE — Progress Notes (Signed)
Speech Language Pathology Daily Session Note  Patient Details  Name: [name] MRN: [id number] Date of Birth: [birth year]  Today's Date: 04/16/2014 Time: 6222-9798 Time Calculation (min): 20 min  Short Term Goals: Week 1: SLP Short Term Goal 1 (Week 1): Patient will sustain attention for 30 seconds with Max multi-modal cues SLP Short Term Goal 2 (Week 1): Patient will initiate self-feeding with Max multi-modal cues  SLP Short Term Goal 3 (Week 1): Patient will follow 1-step commands in a structured, familiar task with Max assist   Skilled Therapeutic Interventions:  Skilled treatment session focused on addressing diagnostic treatment of cognitive-linguistic abilities.  Patient received up right in wheel chair, SLP facilitated session with written CVC words with large font; patient did not initiate verbalizations despite Max clinician attempts.  SLP also presented object and 1-step direction to get object; patient required Total assist to complete task; impairment initiation and timeliness of cessation were observed.  Of note, patient with eyes open for most of session but did not appear to be attending and did not vocalize during session except for when mother turned on musical toy that appeared to agitate patient.  Continue with current plan of care.   FIM:  Comprehension Comprehension Mode: Auditory Comprehension: 1-Understands basic less than 25% of the time/requires cueing 75% of the time Expression Expression Mode: Verbal Expression: 1-Expresses basis less than 25% of the time/requires cueing greater than 75% of the time. Social Interaction Social Interaction: 1-Interacts appropriately less than 25% of the time. May be withdrawn or combative. Problem Solving Problem Solving: 1-Solves basic less than 25% of the time - needs direction nearly all the time or does not effectively solve problems and may need a restraint for safety Memory Memory: 1-Recognizes or recalls less than 25%  of the time/requires cueing greater than 75% of the time FIM - Eating Eating Activity: 1: Helper performs IV, parenteral, or tube feeding  Pain Pain Assessment Pain Assessment: No/denies pain  Therapy/Group: Individual Therapy  [name]., CCC-SLP [place] 04/16/2014, 4:52 PM
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
## 2014-04-17 ENCOUNTER — Inpatient Hospital Stay (HOSPITAL_COMMUNITY): Payer: No Typology Code available for payment source | Admitting: Physical Therapy
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
## 2014-04-17 NOTE — Progress Notes (Signed)
Physical Therapy Session Note  Patient Details  Name: [name] MRN: [id number] Date of Birth: 03-Sep-1981  Today's Date: 04/17/2014 Time: 0900-1000 Time Calculation (min): 60 min  Short Term Goals: Week 1:  PT Short Term Goal 1 (Week 1): Patient will be able to maintain sitting EOb for 3 min w/o LOB ,using R UE for support, and recieving mod A from therapist. PT Short Term Goal 2 (Week 1): Patient will be able to perform rolling to L side with min A . PT Short Term Goal 3 (Week 1): Patient will be able to maintain proper alligment when in w/c,self reposition head to mid line PT Short Term Goal 4 (Week 1): Patient will be able to perform Active ROM for LE 50% of trials, as a result of increasedd attentionand maintaining focus PT Short Term Goal 5 (Week 1): Patient will be able to reach for objects with 50% accuracy to increase alertness,attention and following instructions.  Skilled Therapeutic Interventions/Progress Updates:   Pt received sleeping in bed, mother present for second half of session. Pt positioned with HOB/knees elevated, significant R lateral lean, R LE flexed/abducted up to chest, L UE across body with pronation and elbow/wrist/finger flexion contracture, L hand positioned behind R LE. Performed PROM to LLE to decrease tone in preparation for bed mobility. Initiated prolonged stretch to decrease tone and improve positioning of RLE in bed, see below for pain details. With increased time, able to achieve RLE in relatively neutral hip abd/add with knee flexion. Performed PROM to B feet but with significant contracture/deformity, able to achieve minimal DF/PF of LLE and appearing fixed with hard end feel on RLE. To place lift sling, pt rolling to L with max A and able to grab bed rail with RUE, overall total assist with all other mobility. Total assist +2 with RN tech for mechanical lift bed > tilt in space w/c. In w/c, pt positioned with head in midline and pillows used on R side  for improved alignment and decreased risk of skin breakdown. Prolonged stretch with deep pressure applied to L bicep tendon for tone reduction and elbow extension, progressed to wrist/finger extension with greatly increased pain. Pt attempts to scratch/grab with RUE.   Pt's mother engaging pt in counting, steps to baking a cake, very supportive and encouraging, explaining why therapist is performing stretch. Pt participating in counting, able to thank therapist and RN tech, pain with all stretching. With increased time and prolonged stretch, pt's mother reports that pt achieved "most elbow extension she has seen yet." Reports that she and pt's dad have been stretching pt to maintain what ROM she has but have not been able to gain ROM. She reports that L shoulder is "dislocated" but all other joints are cleared for ROM. Pt's mom and dad are no longer together and keep 2 days on/2 days off schedule; mother is remarried and watches pt's 2 girls, father of pt's son has joint custody and helps with him.   Pt left resting at end of session in w/c with midline positioning and able to maintain LUE and RLE in relatively improved positioning in w/c, mother present.   Therapy Documentation Precautions:  Precautions Precautions: Fall Restrictions Weight Bearing Restrictions: No Pain: Pain Assessment Pain Assessment: Faces Faces Pain Scale: Hurts whole lot Pain Location: Leg Pain Orientation: Right Pain Descriptors / Indicators: Grimacing;Moaning;Guarding Pain Onset: With Activity (with PROM) Pain Intervention(s): Repositioned Multiple Pain Sites: Yes 2nd Pain Site Wong-Baker Pain Rating: Hurts whole lot Pain Location: Arm  Pain Orientation: Left Pain Descriptors / Indicators: Moaning;Grimacing;Guarding Pain Onset: With Activity (with PROM) Pain Intervention(s): Repositioned  See FIM for current functional status  Therapy/Group: Individual Therapy  Laretta Alstrom 04/17/2014, 10:06 AM

## 2014-04-18 ENCOUNTER — Inpatient Hospital Stay (HOSPITAL_COMMUNITY): Payer: Self-pay

## 2014-04-18 ENCOUNTER — Ambulatory Visit (HOSPITAL_COMMUNITY): Payer: Self-pay

## 2014-04-18 ENCOUNTER — Inpatient Hospital Stay (HOSPITAL_COMMUNITY): Payer: No Typology Code available for payment source | Admitting: *Deleted

## 2014-04-18 ENCOUNTER — Inpatient Hospital Stay (HOSPITAL_COMMUNITY): Payer: No Typology Code available for payment source

## 2014-04-18 LAB — COMPREHENSIVE METABOLIC PANEL
ALBUMIN: 3.9 g/dL (ref 3.5–5.2)
ALK PHOS: 88 U/L (ref 39–117)
ALT: 35 U/L (ref 0–35)
AST: 20 U/L (ref 0–37)
BUN: 22 mg/dL (ref 6–23)
CHLORIDE: 99 meq/L (ref 96–112)
CO2: 29 mEq/L (ref 19–32)
Calcium: 10.2 mg/dL (ref 8.4–10.5)
Creatinine, Ser: 0.36 mg/dL — ABNORMAL LOW (ref 0.50–1.10)
GFR calc Af Amer: >90 mL/min (ref >90)
GFR calc non Af Amer: >90 mL/min (ref >90)
Glucose, Bld: 122 mg/dL — ABNORMAL HIGH (ref 70–99)
POTASSIUM: 4.4 meq/L (ref 3.7–5.3)
SODIUM: 141 meq/L (ref 137–147)
TOTAL PROTEIN: 7.4 g/dL (ref 6.0–8.3)
Total Bilirubin: <0.2 mg/dL — ABNORMAL LOW (ref 0.3–1.2)

## 2014-04-18 LAB — CBC WITH DIFFERENTIAL/PLATELET
BASOS ABS: 0 10*3/uL (ref 0.0–0.1)
BASOS PCT: 1 % (ref 0–1)
EOS ABS: 0.1 10*3/uL (ref 0.0–0.7)
Eosinophils Relative: 2 % (ref 0–5)
HCT: 42.3 % (ref 36.0–46.0)
Hemoglobin: 13.7 g/dL (ref 12.0–15.0)
Lymphocytes Relative: 20 % (ref 12–46)
Lymphs Abs: 1 10*3/uL (ref 0.7–4.0)
MCH: 28.4 pg (ref 26.0–34.0)
MCHC: 32.4 g/dL (ref 30.0–36.0)
MCV: 87.6 fL (ref 78.0–100.0)
Monocytes Absolute: 0.5 10*3/uL (ref 0.1–1.0)
Monocytes Relative: 9 % (ref 3–12)
Neutro Abs: 3.4 10*3/uL (ref 1.7–7.7)
Neutrophils Relative %: 68 % (ref 43–77)
PLATELETS: 170 10*3/uL (ref 150–400)
RBC: 4.83 MIL/uL (ref 3.87–5.11)
RDW: 14.9 % (ref 11.5–15.5)
WBC: 5 10*3/uL (ref 4.0–10.5)

## 2014-04-18 MED ORDER — ACETAMINOPHEN 160 MG/5ML PO SOLN
325.0000 mg | ORAL | Status: DC | PRN
  Administered 2014-04-18 – 2014-05-07 (×7): 650 mg via ORAL
  Filled 2014-04-18 (×7): qty 20.3

## 2014-04-18 NOTE — Progress Notes (Signed)
Speech Language Pathology Daily Session Note  Patient Details  Name: [name] MRN: [id number] Date of Birth: 15-Aug-1981  Today's Date: 04/18/2014 Time: 1400-1430 Time Calculation (min): 30 min  Short Term Goals: Week 1: SLP Short Term Goal 1 (Week 1): Patient will sustain attention for 30 seconds with Max multi-modal cues SLP Short Term Goal 2 (Week 1): Patient will initiate self-feeding with Max multi-modal cues  SLP Short Term Goal 3 (Week 1): Patient will follow 1-step commands in a structured, familiar task with Max assist   Skilled Therapeutic Interventions: Pt seen for skilled co-treatment with OT (KP) with emphasis on focused attention, following 1 step commands, bed mobility, self-feeding, and overall cognitive remediation. Pt received supine in bed with father present. Completed rolling in bed with total assist and no initiation for placement of lift pad. Transferred to w/c via mechanical lift with +2 assist for safety. Engaged in oral care via suctioning. Pt initially bringing to mouth and biting then on second trial demonstrated brushing movement approx 3-4 seconds. Pt following simple commands to hold suction and release during this task. Pt also provided trials of ice chips (~10) without overt s/s of aspiration. Pt with decreased motor planning and coordination to bring spoon to mouth, dropping ice each trial. Pt able to pick up ice chip with hands and bring to mouth accurately ~30-40% of time. Pt reporting "yes" to all yes/no question, therefore, unable to determine accuracy. Pt's overall speech intelligibility is impacted by moaning and pt was ~50% intelligible at the word level. Pt easily distracted throughout session, demonstrating impaired focused attention 2-3 seconds. Pt with strong reaching and grasping tendencies throughout session. Pt also continued to demonstrate episodes of blank stares with no blinking, and eye twitching. Pt required increased time to respond to  verbal and tactile cues. At end of session pt left sitting in w/c with father present. Continue with current plan of care.    FIM:  Comprehension Comprehension Mode: Auditory Comprehension: 1-Understands basic less than 25% of the time/requires cueing 75% of the time Expression Expression: 1-Expresses basis less than 25% of the time/requires cueing greater than 75% of the time. Social Interaction Social Interaction: 1-Interacts appropriately less than 25% of the time. May be withdrawn or combative. Problem Solving Problem Solving: 1-Solves basic less than 25% of the time - needs direction nearly all the time or does not effectively solve problems and may need a restraint for safety Memory Memory: 1-Recognizes or recalls less than 25% of the time/requires cueing greater than 75% of the time  Pain Pain Assessment Pain Assessment: Faces Faces Pain Scale: Hurts little more Pain Location: Leg Pain Orientation: Right Pain Descriptors / Indicators: Grimacing (calling out and answered yes to pain?) Pain Onset: On-going Patients Stated Pain Goal: 3 Pain Intervention(s): Medication (See eMAR)  Therapy/Group: Individual Therapy  Buzzy Han 04/18/2014, 4:17 PM

## 2014-04-18 NOTE — Progress Notes (Signed)
Occupational Therapy Session Note  Patient Details  Name: [name] MRN: [id number] Date of Birth: 05/14/1981  Today's Date: 04/18/2014 Time: 0254-2706 and 1130-1158 and 1330-1400 (co-tx with SLP-CP 1330-1430) Time Calculation (min): 59 min and 58 min and 30 min   Short Term Goals: Week 1:  OT Short Term Goal 1 (Week 1): Patient will complete 20% of grooming task with setup OT Short Term Goal 2 (Week 1): Patient will maintain supported sitting balance for 1 minute at edge of bed using bed rail. OT Short Term Goal 3 (Week 1): Patient will tolerate 10 minutes of PROM of LUE with 1 rest break  OT Short Term Goal 4 (Week 1): Patient will select appropriate grooming aid presented among 2 items with 1 verbal cue  Skilled Therapeutic Interventions/Progress Updates:    Session 1: Pt seen for ADL retraining with focus on bed mobility, postural control in sitting, following simple commands, and overall cognitive remediation. Pt received supine in bed. Completed supine>sit with +2 assist and no initiation noted. Pt appears to be fearful with all body movement demonstrating strong reaching and grasping tendencies. Pt required total assist for sitting balance, demonstrating strong lateral lean to right. Pt tolerated sitting EOB approx 40 min. Engaged in conversation regarding pt's family using pictures for identification. Pt stating names with increased time approx 20% of time. Unable to determine accuracy of yes/no at this time. Pt required total assist for dressing. Transferred to w/c using mechanical lift with +2 assist for safety. Pt engaged in grooming task of brushing hair at sink with HOH assist to initiate and pt demonstrating focused attention to task approx 2-3 seconds. Pt combing 1-2 strokes on right side of head only. Pt following 1 step commands approx 10-20% of therapy session. Pt with multiple episodes of starring off with no blinking and requiring increased time to respond to verbal or  tactile cues. At end of session pt left sitting in w/c reclined back with all needs in reach. Mother present.   Session 2: Pt seen for 1:1 OT session with focus on object identification, focused attention, and initiation. Pt received supine in bed moaning. Pt reporting "yes" when asked if in pain however inconsistent with yes/no answers for location of pain and during remainder of therapy session. Emphasis on focused attention, limited to 2-3 seconds. Presented pt with familiar item (cup) to identify. Pt demonstrating automatic response with bringing to mouth and unable to verbalize until presented written words of "cup" and "fork." Pt correctly identifying 1/4 items with use of external written aid. Practiced color identification with pt identifying 1/3 items correctly with increased time. Pt stating only one color and unable to make out other attempts at vocalization. Pt demonstrated more episodes of starring off this afternoon. Pt left supine in bed with all needs in reach.   Session 3: Pt seen for skilled co-treatment with speech therapist (CP) with emphasis on focused attention, following 1 step commands, bed mobility, self-feeding, and overall cognitive remediation. Pt received supine in bed with father present. Completed rolling in bed with total assist and no initiation for placement of lift pad. Transferred to w/c via mechanical lift with +2 assist for safety. Engaged in oral care via suctioning. Pt initially bringing to mouth and biting then on second trial demonstrated brushing movement approx 3-4 seconds. Pt following simple commands to hold suction and release during this task. Speech therapist providing ice chips to pt. Pt with decreased motor planning and coordination to bring spoon to mouth,  dropping ice each trial. Pt with no awareness of dropped ice. Pt able to pick up ice chip with hands and bring to mouth accurately approx 30-40% of time. Pt reporting "yes" to all yes/no question, therefore,  unable to determine accuracy. Pt easily distracted throughout session, demonstrating impaired focused attention 2-3 seconds. Pt with strong reaching and grasping tendencies throughout session. Pt also continued to demonstrate episodes of blank stares with no blinking, and eye twitching. Pt required increased time to respond to verbal and tactile cues. At end of session pt left sitting in w/c with father present.    Therapy Documentation Precautions:  Precautions Precautions: Fall Restrictions Weight Bearing Restrictions: No General:   Vital Signs:   Pain: Difficult to assess d/t cognitive deficits. Pt demonstrates facial grimacing with movement in LUE/LLE.   See FIM for current functional status  Therapy/Group: Individual Therapy  Jaylanie Boschee N Jaevian Shean 04/18/2014, 12:45 PM
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
## 2014-04-18 NOTE — Progress Notes (Signed)
Orthopedic Tech Progress Note Patient Details:  [name] December 11, 1980 [id number] Replacement. Delivered to nursing station. Ortho Devices Type of Ortho Device: Abdominal binder Ortho Device/Splint Interventions: Ordered   [place] R Thompson 04/18/2014, 7:57 AM

## 2014-04-18 NOTE — Progress Notes (Signed)
Patient information reviewed and entered into eRehab system by Sabrena Gavitt, RN, CRRN, PPS Coordinator.  Information including medical coding and functional independence measure will be reviewed and updated through discharge.    

## 2014-04-18 NOTE — Progress Notes (Signed)
Physical Therapy Session Note  Patient Details  Name: [name] MRN: [id number] Date of Birth: 03-10-1981  Today's Date: 04/18/2014 Time: 0930-1030 Time Calculation (min): 60 min  Short Term Goals: Week 1:  PT Short Term Goal 1 (Week 1): Patient will be able to maintain sitting EOb for 3 min w/o LOB ,using R UE for support, and recieving mod A from therapist. PT Short Term Goal 2 (Week 1): Patient will be able to perform rolling to L side with min A . PT Short Term Goal 3 (Week 1): Patient will be able to maintain proper alligment when in w/c,self reposition head to mid line PT Short Term Goal 4 (Week 1): Patient will be able to perform Active ROM for LE 50% of trials, as a result of increasedd attentionand maintaining focus PT Short Term Goal 5 (Week 1): Patient will be able to reach for objects with 50% accuracy to increase alertness,attention and following instructions.  Skilled Therapeutic Interventions/Progress Updates:    Patient received seated in wheelchair. Session focused on postural control in sitting, following simple commands, and overall cognitive remediation. Transfer wheelchair<>mat with mechanical lift and +2 for safety. Completed supine<>sit with +2 assist, patient with zero initiation. Patient appears to be fearful with all body movement, crying out, demonstrating strong reaching and grasping tendencies. Patient requires total assist for sitting balance, demonstrating strong R lateral lean. Patient tolerated sitting EOM ~20 min. Engaged picture identification with pictures of family. Patient stating names with increased time approx 20% of time.   In supine, PROM/tone management of R LE with emphasis on hip neutral/extension, neutral/IR, knee ext, ankle neutral. B ankles positioned in strong supination and plantar flexion with no room for movement and hard end feel. Moving into hip IR with much resistance. Hip extension/neutral positioning appears to be more tone-related  whereas other movements are more limited due to perceived contractures.  Patient follows 1 step commands approx 10-20% of therapy session. Patient with multiple episodes of "blank" stares with no blinking, eye twitching, and requiring increased time to respond to verbal or tactile cues. Patient transferred wheelchair>bed via mechanical lift and +2 assist and left supine in bed with 3 rails up, bed alarm on, and all needs within reach; mother present.  Therapy Documentation Precautions:  Precautions Precautions: Fall Restrictions Weight Bearing Restrictions: No Pain: Pain Assessment Faces Pain Scale: Hurts even more Pain Descriptors / Indicators: Grimacing;Crying Pain Onset: On-going Patients Stated Pain Goal: 4 Pain Intervention(s): Medication (See eMAR) Locomotion : Ambulation Ambulation/Gait Assistance: Not tested (comment)   See FIM for current functional status  Therapy/Group: Co-Treatment with OT CS  Maricella Filyaw S Tudor Chandley S. Raydel Hosick, PT, DPT 04/18/2014, 12:55 PM

## 2014-04-18 NOTE — IPOC Note (Signed)
Overall Plan of Care [facility]) Patient Details Name: [name] MRN: [id number] DOB: 11-18-1981  Admitting Diagnosis: TBI   Hospital Problems: Active Problems:   TBI (traumatic brain injury)     Functional Problem List: Nursing Behavior;Bladder;Bowel;Medication Management;Pain;Safety;Skin Integrity  PT Balance;Endurance;Motor;Safety  OT Balance;Behavior;Cognition;Endurance;Motor;Pain;Perception;Safety;Sensory;Skin Integrity  SLP Cognition;Linguistic;Safety;Nutrition;Behavior  TR         Basic ADL's: OT Eating;Grooming;Bathing;Dressing;Toileting     Advanced  ADL's: OT       Transfers: PT Bed Mobility;Bed to Chair  OT Toilet;Tub/Shower     Locomotion: PT       Additional Impairments: OT Fuctional Use of Upper Extremity  SLP Swallowing;Communication;Social Cognition comprehension;expression Social Interaction;Problem Solving;Memory;Attention;Awareness  TR      Anticipated Outcomes Item Anticipated Outcome  Self Feeding Max Assist  Swallowing  Mod assist    Basic self-care  Max Assist X 1  Toileting  Max Assist X 1   Bathroom Transfers Max Assist  Bowel/Bladder  Incontinent of bowel and bladder LBM 5/7  Transfers  maxA  Locomotion     Communication  Mod assist   Cognition  Mod assist   Pain  <4  Safety/Judgment  total assist   Therapy Plan: PT Intensity: Minimum of 1-2 x/day ,45 to 90 minutes PT Frequency: 5 out of 7 days PT Duration Estimated Length of Stay: 4 weeks OT Intensity: Minimum of 1-2 x/day, 45 to 90 minutes OT Frequency: 5 out of 7 days OT Duration/Estimated Length of Stay: 4 weeks SLP Intensity: Minumum of 1-2 x/day, 30 to 90 minutes SLP Frequency: 5 out of 7 days SLP Duration/Estimated Length of Stay: TBD based on progress 3-4 weeks       Team Interventions: Nursing Interventions Patient/Family Education;Bladder Management;Bowel Management;Disease Management/Prevention;Pain Management;Medication Management;Skin Care/Wound  Management;Discharge Planning  PT interventions Therapeutic Activities;Splinting/orthotics;Balance/vestibular training;Neuromuscular re-education  OT Interventions Cognitive remediation/compensation;Discharge planning;Functional mobility training;Neuromuscular re-education;DME/adaptive equipment instruction;Patient/family education;Pain management;Self Care/advanced ADL retraining;Skin care/wound managment;Splinting/orthotics;UE/LE Strength taining/ROM;Therapeutic Exercise;Therapeutic Activities  SLP Interventions Cognitive remediation/compensation;Cueing hierarchy;Dysphagia/aspiration precaution training;Environmental controls;Functional tasks;Internal/external aids;Patient/family education;Speech/Language facilitation;Therapeutic Activities  TR Interventions    SW/CM Interventions      Team Discharge Planning: Destination: PT-Long Term Care (LTC) ,OT- Long Term Care (LTC) , SLP-Home Projected Follow-up: PT-Skilled nursing facility, OT-  24 hour supervision/assistance, SLP-24 hour supervision/assistance;Home Health SLP Projected Equipment Needs: PT-To be determined, OT- To be determined, SLP- (TBD) Equipment Details: PT- , OT-  Patient/family involved in discharge planning: PT- Family member/caregiver,  OT-Family member/caregiver, SLP-Family member/caregiver  MD ELOS: 4 weeks Medical Rehab Prognosis:  Good Assessment: The patient has been admitted for CIR therapies with the diagnosis of severe TBI with associated polytrauma. The team will be addressing functional mobility, strength, stamina, balance, safety, adaptive techniques and equipment, self-care, bowel and bladder mgt, patient and caregiver education, NMR, spasticity mgt, pain control, behavioral mgt, restoration of sleep/wake cycle, environmental mod, swallowing, communication and cognition. Goals have been set at mod to max assist across all disciplines as this is a severe brain injury which occurred in January of this year. Family is  invested however in bringing her home.    Meredith Staggers, MD, FAAPMR      See Team Conference Notes for weekly updates to the plan of care

## 2014-04-19 ENCOUNTER — Ambulatory Visit (HOSPITAL_COMMUNITY): Payer: Self-pay | Admitting: *Deleted

## 2014-04-19 ENCOUNTER — Inpatient Hospital Stay (HOSPITAL_COMMUNITY): Payer: No Typology Code available for payment source | Admitting: Occupational Therapy

## 2014-04-19 ENCOUNTER — Inpatient Hospital Stay (HOSPITAL_COMMUNITY): Payer: No Typology Code available for payment source

## 2014-04-19 DIAGNOSIS — S069X9A Unspecified intracranial injury with loss of consciousness of unspecified duration, initial encounter: Secondary | ICD-10-CM

## 2014-04-19 DIAGNOSIS — S42309B Unspecified fracture of shaft of humerus, unspecified arm, initial encounter for open fracture: Secondary | ICD-10-CM

## 2014-04-19 DIAGNOSIS — G8250 Quadriplegia, unspecified: Secondary | ICD-10-CM

## 2014-04-19 DIAGNOSIS — S069XAA Unspecified intracranial injury with loss of consciousness status unknown, initial encounter: Secondary | ICD-10-CM

## 2014-04-19 DIAGNOSIS — R1310 Dysphagia, unspecified: Secondary | ICD-10-CM

## 2014-04-19 MED ORDER — QUETIAPINE FUMARATE 25 MG PO TABS
25.0000 mg | ORAL_TABLET | Freq: Two times a day (BID) | ORAL | Status: DC
  Administered 2014-04-19 – 2014-04-25 (×12): 25 mg via ORAL
  Filled 2014-04-19 (×14): qty 1

## 2014-04-19 MED ORDER — QUETIAPINE FUMARATE 100 MG PO TABS
100.0000 mg | ORAL_TABLET | Freq: Every day | ORAL | Status: DC
  Administered 2014-04-19: 100 mg via ORAL
  Filled 2014-04-19 (×3): qty 1

## 2014-04-19 MED ORDER — QUETIAPINE FUMARATE 50 MG PO TABS
50.0000 mg | ORAL_TABLET | Freq: Two times a day (BID) | ORAL | Status: DC
  Filled 2014-04-19 (×2): qty 1

## 2014-04-19 MED ORDER — TRAMADOL HCL 50 MG PO TABS
50.0000 mg | ORAL_TABLET | Freq: Two times a day (BID) | ORAL | Status: DC
  Administered 2014-04-19: 50 mg via ORAL
  Filled 2014-04-19: qty 1

## 2014-04-19 MED ORDER — BACLOFEN 10 MG PO TABS
10.0000 mg | ORAL_TABLET | Freq: Four times a day (QID) | ORAL | Status: DC
  Administered 2014-04-19 – 2014-04-20 (×7): 10 mg
  Filled 2014-04-19 (×12): qty 1

## 2014-04-19 NOTE — Progress Notes (Signed)
Physical Therapy Session Note  Patient Details  Name: [name] MRN: [id number] Date of Birth: 11-12-1981  Today's Date: 04/19/2014 Time: 0930-1030 Time Calculation (min): 60 min  Short Term Goals: Week 1:  PT Short Term Goal 1 (Week 1): Patient will be able to maintain sitting EOb for 3 min w/o LOB ,using R UE for support, and recieving mod A from therapist. PT Short Term Goal 2 (Week 1): Patient will be able to perform rolling to L side with min A . PT Short Term Goal 3 (Week 1): Patient will be able to maintain proper alligment when in w/c,self reposition head to mid line PT Short Term Goal 4 (Week 1): Patient will be able to perform Active ROM for LE 50% of trials, as a result of increasedd attentionand maintaining focus PT Short Term Goal 5 (Week 1): Patient will be able to reach for objects with 50% accuracy to increase alertness,attention and following instructions.  Skilled Therapeutic Interventions/Progress Updates:    Patient received sitting in wheelchair. Co-treatment with OT CS with emphasis on focused and sustained attention, object identification, color identification, one and two step commands, reading one and two words. Patient able to correctly identify colors and follow one step commands approximately 20% of the time using R hand to point or grab object. Patient able to verbalize one and two words approx 25% of the time with intelligibility. Patient overall with increased lethargy during session, reports from father that she did not sleep last night. Patient with periods of increased escalation, demonstrating what appears to be increased frustration with activity. Patient returned to room and transferred wheelchair>bed [facility] and +2 for safety.  Therapy Documentation Precautions:  Precautions Precautions: Fall Restrictions Weight Bearing Restrictions: No Pain: Pain Assessment Pain Assessment: Faces Pain Score: 0-No pain Faces Pain Scale: Hurts little  more Locomotion : Ambulation Ambulation/Gait Assistance: Not tested (comment)   See FIM for current functional status  Therapy/Group: Co-Treatment with OT CS  Verlena Marlette S Jeannia Tatro S. Elchonon Maxson, PT, DPT 04/19/2014, 2:53 PM
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
## 2014-04-19 NOTE — Progress Notes (Signed)
NUTRITION FOLLOW UP  Intervention:   1. Enteral nutrition; Continue Jevity 1.5 @ 75 ml/hr x 20 hours daily. (Anticipate TF to be held 4 hours daily for participation in therapy.) 2. Free water; flush tube with 30 mL q hrs x 20 hrs daily.   NUTRITION DIAGNOSIS:  Inadequate oral intake related to inability to eat, dysphagia as evidenced by TF dependent.   Monitor:  1. Enteral nutrition; initiation with tolerance. Pt to meet >/=90% estimated needs with nutrition support.  2. Wt/wt change; monitor trends  Assessment:   Pt admitted with deconditioning s/p TBI. She has trach/PEG. Her home regimen is Jevity 1.5 @ 60 mL/hr continuous with 25 mL free water flushes q hr.   Pt continues with good tolerance of TF regimen.  RD reviewed SLP session documentation.  Note pt able to participate in PO trials with ice chips. RD will continue to follow for progress with therapy.   RD is unsure about pt's potential for bolus feeds at this time.  Spends some time in wheelchair as well as bed.  Spasticity with head turning alters position in bed. Would continue continuous feeds for now.  Height: Ht Readings from Last 1 Encounters:  04/15/14 5\' 5"  (1.651 m)    Weight Status:   Wt Readings from Last 1 Encounters:  04/15/14 125 lb (56.7 kg)    Re-estimated needs:  Kcal: 2000-2200  Protein: 90-105g  Fluid: ~2.0 L/d  Skin: intact  Diet Order: NPO  No intake or output data in the 24 hours ending 04/19/14 1513  Last BM: 5/11   Labs:   Recent Labs Lab 04/18/14 0706  NA 141  K 4.4  CL 99  CO2 29  BUN 22  CREATININE 0.36*  CALCIUM 10.2  GLUCOSE 122*    CBG (last 3)  No results found for this basename: GLUCAP,  in the last 72 hours  Scheduled Meds: . baclofen  10 mg Per Tube QID  . carBAMazepine  200 mg Oral 3 times per day  . ferrous sulfate  300 mg Oral BID WC  . free water  30 mL Per Tube Q1H  . pantoprazole sodium  40 mg Per Tube Daily  . propranolol  20 mg Per Tube TID  .  QUEtiapine  50 mg Oral QHS  . traMADol  50 mg Oral BID    Continuous Infusions: . feeding supplement (JEVITY 1.5 CAL/FIBER) 1,000 mL (04/18/14 1526)    Brynda Greathouse, MS RD LDN Clinical Inpatient Dietitian Pager: [phone] Weekend/After hours pager: [phone]

## 2014-04-19 NOTE — Progress Notes (Signed)
Occupational Therapy Session Note  Patient Details  Name: [name] MRN: [id number] Date of Birth: 06-06-1981  Today's Date: 04/19/2014 Time: 4627-0350 and 1130-1200 and 0938-1829 (co-tx with SLP [id number]) Time Calculation (min): 42 min and 30 min and 15 min   Short Term Goals: Week 1:  OT Short Term Goal 1 (Week 1): Patient will complete 20% of grooming task with setup OT Short Term Goal 2 (Week 1): Patient will maintain supported sitting balance for 1 minute at edge of bed using bed rail. OT Short Term Goal 3 (Week 1): Patient will tolerate 10 minutes of PROM of LUE with 1 rest break  OT Short Term Goal 4 (Week 1): Patient will select appropriate grooming aid presented among 2 items with 1 verbal cue  Skilled Therapeutic Interventions/Progress Updates:    Session 1: Pt seen for skilled OT session with focus on sitting balance, focused attention, following simple commands, and overall cognitive remediation during dressing task. Pt received supine in bed with father reporting pt did not sleep well last night. Pt required total assist for sitting EOB with increased tone noted in RLE. Completed UB dressing with total assist for donning shirt. Pt closing eyes multiple times while sitting EOB, requiring tactile and verbal cues for arousal. Pt with impaired focused attention 1-2 seconds during functional tasks throughout session with pt answering 1 question. Transferred to w/c with total assist via mechanical lift +2 for safety. Total assist for repositioning in w/c. Pt closing eyes and difficult to remain aroused and engaged in task/commands. Educated husband on TBI, fatigue levels, etc. Encouraged pt to have break time in room with all lights and tv off to decrease external stimuli and allow rest before next therapy session.   Session 2: Pt seen for 1:1 OT sessio with focus on attention to task, following simple commands, and color identification. Pt received supine in bed with HOB elevated  and father present. Pt more alert this afternoon. Engaged in peg task using 2 pegs with focus on following simple command and identifying colors. Pt followed 1 step command approx 10% of time and unable to correctly verbalize color when given 2 choices. Pt demonstrating focused attention to task approx 2-3 seconds. Pt answering yes/no questions approx 20% of therapy session with increased time and demonstrated some repetition of speech approx 10% of time. Provided passive stretching to RLE. Pt's father assisted with optimal repositioning in bed. Pt left supine in bed with HOB elevated 42 degrees.    Session 3: Pt seen for skilled co-treatment with SLP (CP) with focus on attention to task and following simple commands. Pt received in w/c with father present.  Engaged in grasp/release task of toss ball to simulate familiar task of softball. Pt intermittently agitated throughout therapeutic task but was easily calmed. Pt easily distracted throughout task, demonstrating impaired focused attention 2-3 seconds. Pt would toss ball 1-3x before requiring redirection to task. Pt with strong reaching and grasping tendencies throughout session. Pt also continued to demonstrate episodes of blank stares with no blinking, and eye twitching. Pt required increased time to respond to verbal and tactile cues. At end of session pt returned to room and left in w/c with father present.  Therapy Documentation Precautions:  Precautions Precautions: Fall Restrictions Weight Bearing Restrictions: No General: General Amount of Missed OT Time (min): 16 Minutes Vital Signs: Therapy Vitals Temp: 97.9 F (36.6 C) Temp src: Axillary Pulse Rate: 101 Resp: 19 BP: 136/93 mmHg Patient Position, if appropriate: Lying Oxygen Therapy SpO2:  98 % O2 Device: None (Room air) Pain:   Other Treatments:    See FIM for current functional status  Therapy/Group: Individual Therapy  Houa Ackert N Mandeep Kiser 04/19/2014, 8:26 AM

## 2014-04-19 NOTE — Progress Notes (Addendum)
Speech Language Pathology Daily Session Note  Patient Details  Name: [name] MRN: [id number] Date of Birth: 1981-02-24  Today's Date: 04/19/2014 Time: 1300-1340 Time Calculation (min): 40 min  Short Term Goals: Week 1: SLP Short Term Goal 1 (Week 1): Patient will sustain attention for 30 seconds with Max multi-modal cues SLP Short Term Goal 2 (Week 1): Patient will initiate self-feeding with Max multi-modal cues  SLP Short Term Goal 3 (Week 1): Patient will follow 1-step commands in a structured, familiar task with Max assist   Skilled Therapeutic Interventions: Pt seen for skilled co-treatment with OT (JLS/KP) with emphasis on focused attention, following 1 step commands, bed mobility, self-feeding, and overall cognitive remediation. Pt received supine in bed with father present. Completed rolling in bed with total assist for placement of lift pad. Transferred to w/c via mechanical lift with +2 assist for safety.  Pt participated in ice chip trial X 1 and required extra time and total A with hand over hand to initiate self-feeding. Pt consumed without overt s/s of aspiration and demonstrated a timely swallow.  Pt declined remainder of trials. Pt became intermittently agitated throughout the therapeutic task of reaching for a number and placing it in numerical order from a field of 2 but was easily calmed when repositioned and the demand of the table task was diminished. Pt completed task with total A multimodal cues.  Pt also participated in throwing a ball back and forth with her father with extra time and Max-Total A mutlimodal cues for initiation and sustained attention to task for ~3 turns. Pt's overall speech intelligibility is impacted by moaning and pt was ~50% intelligible at the word level. Pt easily distracted throughout session, demonstrating impaired focused attention 2-3 seconds. Pt with strong reaching and grasping tendencies throughout session. Pt also continued to demonstrate  episodes of blank stares with no blinking, and eye twitching. Pt required increased time to respond to verbal and tactile cues. At end of session pt left sitting in w/c with father present. Continue with current plan of care.   FIM:  Comprehension Comprehension Mode: Auditory Comprehension: 1-Understands basic less than 25% of the time/requires cueing 75% of the time Expression Expression Mode: Verbal Expression: 1-Expresses basis less than 25% of the time/requires cueing greater than 75% of the time. Social Interaction Social Interaction: 1-Interacts appropriately less than 25% of the time. May be withdrawn or combative. Problem Solving Problem Solving: 1-Solves basic less than 25% of the time - needs direction nearly all the time or does not effectively solve problems and may need a restraint for safety Memory Memory: 1-Recognizes or recalls less than 25% of the time/requires cueing greater than 75% of the time FIM - Eating Eating Activity: 2: Hand over hand assist  Pain Pain Assessment Pain Assessment: Faces Pain Score: 0-No pain Faces Pain Scale: Hurts little more  Therapy/Group: Individual Therapy  Buzzy Han 04/19/2014, 3:03 PM

## 2014-04-20 ENCOUNTER — Inpatient Hospital Stay (HOSPITAL_COMMUNITY): Payer: No Typology Code available for payment source

## 2014-04-20 ENCOUNTER — Encounter (HOSPITAL_COMMUNITY): Payer: Self-pay | Admitting: Occupational Therapy

## 2014-04-20 ENCOUNTER — Inpatient Hospital Stay (HOSPITAL_COMMUNITY): Payer: No Typology Code available for payment source | Admitting: *Deleted

## 2014-04-20 DIAGNOSIS — S069XAA Unspecified intracranial injury with loss of consciousness status unknown, initial encounter: Secondary | ICD-10-CM

## 2014-04-20 DIAGNOSIS — S069X9A Unspecified intracranial injury with loss of consciousness of unspecified duration, initial encounter: Secondary | ICD-10-CM

## 2014-04-20 DIAGNOSIS — G8250 Quadriplegia, unspecified: Secondary | ICD-10-CM

## 2014-04-20 DIAGNOSIS — R1310 Dysphagia, unspecified: Secondary | ICD-10-CM

## 2014-04-20 DIAGNOSIS — S42309B Unspecified fracture of shaft of humerus, unspecified arm, initial encounter for open fracture: Secondary | ICD-10-CM

## 2014-04-20 MED ORDER — TRAZODONE HCL 50 MG PO TABS
50.0000 mg | ORAL_TABLET | Freq: Every day | ORAL | Status: DC
  Administered 2014-04-20 – 2014-05-10 (×21): 50 mg via ORAL
  Filled 2014-04-20 (×21): qty 1

## 2014-04-20 MED ORDER — FREE WATER
150.0000 mL | Freq: Three times a day (TID) | Status: DC
  Administered 2014-04-20 – 2014-05-03 (×52): 150 mL

## 2014-04-20 MED ORDER — QUETIAPINE FUMARATE 50 MG PO TABS
150.0000 mg | ORAL_TABLET | Freq: Every day | ORAL | Status: DC
  Administered 2014-04-20 – 2014-05-10 (×21): 150 mg via ORAL
  Filled 2014-04-20 (×23): qty 1

## 2014-04-20 NOTE — Progress Notes (Signed)
Physical Therapy Session Note  Patient Details  Name: [name] MRN: [id number] Date of Birth: 1981-10-14  Today's Date: 04/20/2014  Please see below for revised STGs for Week 1 of patient's stay on rehab. All STGs discontinued secondary to inappropriate for patient's CLOF. STGs=LTGs secondary to patient's CLOF and anticipated discharge at totalA/dependent level of function.  Short Term Goals: Week 1:  PT Short Term Goal 1 (Week 1): Patient will be able to maintain sitting EOb for 3 min w/o LOB ,using R UE for support, and recieving mod A from therapist. PT Short Term Goal 1 - Progress (Week 1): Discontinued (comment) (goal not appropriate for patient's CLOF) PT Short Term Goal 2 (Week 1): Patient will be able to perform rolling to L side with min A . PT Short Term Goal 2 - Progress (Week 1): Discontinued (comment) (goal not appropriate for patient's CLOF) PT Short Term Goal 3 (Week 1): Patient will be able to maintain proper alligment when in w/c,self reposition head to mid line PT Short Term Goal 3 - Progress (Week 1): Discontinued (comment) (goal not appropriate for patient's CLOF) PT Short Term Goal 4 (Week 1): Patient will be able to perform Active ROM for LE 50% of trials, as a result of increasedd attentionand maintaining focus PT Short Term Goal 4 - Progress (Week 1): Discontinued (comment) (goal not appropriate for patient's CLOF) PT Short Term Goal 5 (Week 1): Patient will be able to reach for objects with 50% accuracy to increase alertness,attention and following instructions. PT Short Term Goal 5 - Progress (Week 1): Discontinued (comment) (goal not appropriate for patient's CLOF)  [name], PT, DPT 04/20/2014, 10:16 PM

## 2014-04-20 NOTE — Care Management Note (Signed)
[facility] Individual Statement of Services  Patient Name:  [name]  Date:  04/18/2014  Welcome to the [facility].  Our goal is to provide you with an individualized program based on your diagnosis and situation, designed to meet your specific needs.  With this comprehensive rehabilitation program, you will be expected to participate in at least 3 hours of rehabilitation therapies Monday-Friday, with modified therapy programming on the weekends.  Your rehabilitation program will include the following services:  Physical Therapy (PT), Occupational Therapy (OT), Speech Therapy (ST), 24 hour per day rehabilitation nursing, Therapeutic Recreaction (TR), Neuropsychology, Case Management (Social Worker), Rehabilitation Medicine, Nutrition Services and Pharmacy Services  Weekly team conferences will be held on Tuesdays to discuss your progress.  Your Social Worker will talk with you frequently to get your input and to update you on team discussions.  Team conferences with you and your family in attendance may also be held.  Expected length of stay: 4 weeks  Overall anticipated outcome: max - total assist  Depending on your progress and recovery, your program may change. Your Social Worker will coordinate services and will keep you informed of any changes. Your Social Worker's name and contact numbers are listed  below.  The following services may also be recommended but are not provided by the [facility] will be made to provide these services after discharge if needed.  Arrangements include referral to agencies that provide these services.  Your insurance has been verified to be:  Medicaid Your primary doctor is:  None  Pertinent information will be shared with your doctor and your insurance company.  Social  Worker:  Lennart Pall, [place] or ([phone]   Information discussed with and copy given to patient by: Lennart Pall, 04/18/2014, 10:24 AM

## 2014-04-20 NOTE — Progress Notes (Signed)
Speech Language Pathology Daily Session Note  Patient Details  Name: [name] MRN: [id number] Date of Birth: [birth year]  Today's Date: 04/20/2014 Time: 1400-1415  Time Calculation (min): 15 min  Short Term Goals: Week 1: SLP Short Term Goal 1 (Week 1): Patient will sustain attention for 30 seconds with Max multi-modal cues SLP Short Term Goal 2 (Week 1): Patient will initiate self-feeding with Max multi-modal cues  SLP Short Term Goal 3 (Week 1): Patient will follow 1-step commands in a structured, familiar task with Max assist   Skilled Therapeutic Interventions: Co-treatment with OT (KP) with focus on focused attention, following 1 step commands, bed mobility, self-feeding, functional communication and overall cognitive remediation. Pt received supine in bed with mother present. Completed rolling in bed with total assist for repositioning of pants and lift pad. Transferred to w/c via mechanical lift with +2 assist for safety. Pt participated in ice chip trials and required total A for self-feeding. Pt demonstrated a suspected delay swallow initiation and required verbal cues to initiate swallow with all trials, however, no overt s/s of aspiration were observed. Pt required Max multimodal cues for repetition of verbal expression at the word and phrase level utilizing a slow rate and decreased vocal intensity which improved pt's overall intelligibility throughout the session, especially at the word level. Pt also demonstrated decreased grabbing with her RUE throughout the session. Pt left with OT to complete session. Continue with current plan of care.    FIM:  Comprehension Comprehension Mode: Auditory Comprehension: 1-Understands basic less than 25% of the time/requires cueing 75% of the time Expression Expression Mode: Verbal Expression: 1-Expresses basis less than 25% of the time/requires cueing greater than 75% of the time. Social Interaction Social Interaction: 1-Interacts  appropriately less than 25% of the time. May be withdrawn or combative. Problem Solving Problem Solving: 1-Solves basic less than 25% of the time - needs direction nearly all the time or does not effectively solve problems and may need a restraint for safety Memory Memory: 1-Recognizes or recalls less than 25% of the time/requires cueing greater than 75% of the time  Pain Reported pain "in different places," unable to rate. RN notified and administered medications.   Therapy/Group: Individual Therapy  Buzzy Han 04/20/2014, 4:08 PM

## 2014-04-20 NOTE — Progress Notes (Signed)
Occupational Therapy Session Note  Patient Details  Name: [name] MRN: [id number] Date of Birth: [birth year]  Today's Date: 04/20/2014 Time: 3790 (co-tx with SLP 1400-1430)-1500 Time Calculation (min): 45 min  Short Term Goals: Week 1:  OT Short Term Goal 1 (Week 1): Patient will complete 20% of grooming task with setup OT Short Term Goal 2 (Week 1): Patient will maintain supported sitting balance for 1 minute at edge of bed using bed rail. OT Short Term Goal 3 (Week 1): Patient will tolerate 10 minutes of PROM of LUE with 1 rest break  OT Short Term Goal 4 (Week 1): Patient will select appropriate grooming aid presented among 2 items with 1 verbal cue  Skilled Therapeutic Interventions/Progress Updates:  Pt seen for skilled therapeutic co-tx with SLP (CP) with focus on focused attention, following 1 step commands, bed mobility, self-feeding, functional communication and overall cognitive remediation. Pt received supine in bed with mother present. Completed rolling in bed with total assist for repositioning of pants and lift pad. Transferred to w/c via mechanical lift with +2 assist for safety. Engaged in self-feeding of ice chips requiring total assist to complete. Pt required max multimodal cues for repetition of verbal expression and using slow rate and decreased vocal intensity which improved pt's overall intelligibility throughout the session. Pt also demonstrated decreased grabbing with her RUE throughout the session. Engaged in therapeutic stretching to LUE for tone management. Engaged in orientation questions (birthday, children's names, etc) with 50% accuracy. Pt speaking with high intensity and quick pace requiring increased time to re-direct. Required max cues for use of low intensity and slowed rate and pt verbalized wanting to return to room. Used "break" before redirecting back to task and encouraging completion prior to return to room. Pt returned to room after answering yes/no  question. Returned to room and mother present. Provided pt with option of returning to bed on staying in chair. Pt agreeable to stay in chair. Reclined chair back and encouraged mother to keep lights and tv off. Pt and mother and left with all needs in reach.    Therapy Documentation Precautions:  Precautions Precautions: Fall Restrictions Weight Bearing Restrictions: No General:   Vital Signs: Therapy Vitals Pulse Rate: 103 BP: 150/99 mmHg Pain: Other Treatments:    See FIM for current functional status  Therapy/Group: Individual Therapy and Co-Treatment  Louay Myrie N Breelle [place] 04/20/2014, 4:11 PM

## 2014-04-20 NOTE — Progress Notes (Signed)
Physical Therapy Session Note  Patient Details  Name: [name] MRN: [id number] Date of Birth: Nov 27, 1981  Today's Date: 04/20/2014 Time:  1030-1130 and 1300-1330 Time Calculation (in min): 60 min and 30 min  Short Term Goals: Week 1:  PT Short Term Goal 1 (Week 1): Patient will be able to maintain sitting EOb for 3 min w/o LOB ,using R UE for support, and recieving mod A from therapist. PT Short Term Goal 2 (Week 1): Patient will be able to perform rolling to L side with min A . PT Short Term Goal 3 (Week 1): Patient will be able to maintain proper alligment when in w/c,self reposition head to mid line PT Short Term Goal 4 (Week 1): Patient will be able to perform Active ROM for LE 50% of trials, as a result of increasedd attentionand maintaining focus PT Short Term Goal 5 (Week 1): Patient will be able to reach for objects with 50% accuracy to increase alertness,attention and following instructions.  Skilled Therapeutic Interventions/Progress Updates:    AM Session: Patient received sitting in wheelchair, father present. Session focused on focused/sustained attention, following simple commands, tolerance to positions and PROM. Transfer wheelchair<>mat with mechanical lift and +2 for safety. Completed supine<>sit with +2 assist. Patient continues to appear anxious/fearful with all body movement, crying out, demonstrating strong reaching and grasping tendencies. Patient requires total assist for sitting balance, demonstrating strong R lateral lean.   In supine, further assessment of joints: B ankles, R knee flex/ext, R hip flex/ext/ IR, and L shoulder abd/flex, elbow ext, wrist and finger ext. PROM/tone management of R LE with emphasis on hip neutral/extension, neutral/IR, knee ext, ankle pronation. B ankles positioned in strong supination and plantar flexion with no room for movement and hard end feel. Moving into hip IR with much resistance. Hip extension/neutral positioning appears to be  more tone-related whereas other movements are more limited due to perceived contractures.   Patient overall tolerating PROM well, but does cry out-unsure if related to movement or something else. Patient follows 1 step commands approx 10-20% of therapy session. Patient with multiple episodes of "blank" stares with no blinking, eye twitching, and requiring increased time to respond to verbal or tactile cues. Concern about hygiene for L hand due to significant finger/wrist flexion.  Education/discussion with patient's father about goals for therapy and discharge planning. Extensive TBI education required secondary to apparent lack of knowledge of patient's CLOF, prognosis, and effect of TBI on all aspects of function.  PM Session: Patient received supine in bed, asleep, but easily aroused. Session focused on focused/sustained attention and following simple commands with rolling to B sides and mechanical lift transfer bed>wheelchair in preparation for OT/SLP cotreat. Patient performs rolling to B sides to place lift sling, requires maxA, but appears to understand rolling. Mechanical lift transfer bed>wheelchair with +2 assist for safety. Patient left sitting in wheelchair with mom present for supervision.  Education/discussion with patient's mother about goals for therapy and discharge planning. Patient's mother asking about "when patient will be walking today." Extensive TBI education required secondary to apparent lack of knowledge of patient's CLOF, prognosis, and effect of TBI on all aspects of function.   Therapy Documentation Precautions:  Precautions Precautions: Fall Precaution Comments: severe contractures B ankles, L UE; high tone R LE Restrictions Weight Bearing Restrictions: No Pain: Pain Assessment Pain Assessment: Faces Faces Pain Scale: Hurts even more (with PROM of L UE and R LE)  See FIM for current functional status  Therapy/Group: Individual Therapy  [place] [name], PT, DPT 04/20/2014, 9:42 PM
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
## 2014-04-20 NOTE — Plan of Care (Signed)
Problem: RH Memory Goal: LTG Patient demonstrate ability for day to day recall (PT) LTG: Patient will demonstrate ability for day to day recall/carryover during activities with assist (PT)  Outcome: Not Applicable Date Met:  10/93/23 Goal discharged 04/20/14 secondary to patient's extensive cognitive deficits. Plan of care to include shift in goals to be caregiver-focused as anticipate patient will discharge at dependent/totalA level for all function.
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
## 2014-04-20 NOTE — Progress Notes (Signed)
Occupational Therapy Session Note  Patient Details  Name: [name] MRN: [id number] Date of Birth: 06-Aug-1981  Today's Date: 04/20/2014 Time: 6644-0347 Time Calculation (min): 50 min  Short Term Goals: Week 1:  OT Short Term Goal 1 (Week 1): Patient will complete 20% of grooming task with setup OT Short Term Goal 2 (Week 1): Patient will maintain supported sitting balance for 1 minute at edge of bed using bed rail. OT Short Term Goal 3 (Week 1): Patient will tolerate 10 minutes of PROM of LUE with 1 rest break  OT Short Term Goal 4 (Week 1): Patient will select appropriate grooming aid presented among 2 items with 1 verbal cue  Skilled Therapeutic Interventions/Progress Updates:    1:1 Participated in therapeutic bed mobility, transitional movements, answering basic orientation and demographic information , following one step directions with basic self care tasks ie grooming and dressing. Visually tracking with rolling and hand over hand assist to use bed rail for donning pants. Transitioned to edge of bed with total A +2 with increased tactile heavy input to decreased fear of movement. Sat EOB with +1 with total A Pt oriented to self (age, birthday, # children), but required choices for knowledge of month, place, and time. Pt allowed dirty shirt to come off and clean shirt to bed donned: following one step directions to thread right arm and bend head down to put head through hole. Pt required frequent "breaks" from stimulus throughout session to decrease fear and agitation. Transitioned into tilt and space w/c with maxi sky.  Pt did respond well to a targeted goal ie" can take a break when shirt is on" Or " finish brushing your teeth and then can take a break." A break meant reclining back in tilt in space w/c and not placing a demand on her neither physical or cognitive for a minute. Pt brushed teeth following directions to get all areas of her mouth; assisted with turning on and off water.  Also assisted HOH with watering plant.   Dad present to observe and continued to education our goal or getting her to participate in basic tasks. Reported she did not sleep well again last night.  Therapy Documentation Precautions:  Precautions Precautions: Fall Restrictions Weight Bearing Restrictions: No General: General Amount of Missed OT Time (min): 10 Minutes Pain - Discomfort in left UE with movement; relief with rest See FIM for current functional status  Therapy/Group: Individual Therapy  Merrilee Seashore 04/20/2014, 11:00 AM

## 2014-04-21 ENCOUNTER — Inpatient Hospital Stay (HOSPITAL_COMMUNITY): Payer: No Typology Code available for payment source | Admitting: Speech Pathology

## 2014-04-21 ENCOUNTER — Inpatient Hospital Stay (HOSPITAL_COMMUNITY): Payer: No Typology Code available for payment source | Admitting: *Deleted

## 2014-04-21 ENCOUNTER — Inpatient Hospital Stay (HOSPITAL_COMMUNITY): Payer: Self-pay

## 2014-04-21 ENCOUNTER — Inpatient Hospital Stay (HOSPITAL_COMMUNITY): Payer: No Typology Code available for payment source

## 2014-04-21 DIAGNOSIS — S069X9A Unspecified intracranial injury with loss of consciousness of unspecified duration, initial encounter: Secondary | ICD-10-CM

## 2014-04-21 DIAGNOSIS — S42309B Unspecified fracture of shaft of humerus, unspecified arm, initial encounter for open fracture: Secondary | ICD-10-CM

## 2014-04-21 DIAGNOSIS — S069XAA Unspecified intracranial injury with loss of consciousness status unknown, initial encounter: Secondary | ICD-10-CM

## 2014-04-21 DIAGNOSIS — G8250 Quadriplegia, unspecified: Secondary | ICD-10-CM

## 2014-04-21 DIAGNOSIS — R1310 Dysphagia, unspecified: Secondary | ICD-10-CM

## 2014-04-21 MED ORDER — BACLOFEN 20 MG PO TABS
20.0000 mg | ORAL_TABLET | Freq: Three times a day (TID) | ORAL | Status: DC
  Administered 2014-04-21 – 2014-04-23 (×7): 20 mg
  Filled 2014-04-21 (×10): qty 1

## 2014-04-21 NOTE — Plan of Care (Signed)
Problem: RH Dressing Goal: LTG Patient will perform upper body dressing (OT) LTG Patient will perform upper body dressing with assist, with/without cues (OT).  Outcome: Not Applicable Date Met:  46/50/35  Goal discharged secondary to patient's extensive cognitive deficits. Plan of care to include shift in goals to be caregiver-focused as anticipate patient will discharge at dependent/totalA level for all function.    Goal: LTG Patient will perform lower body dressing w/assist (OT) LTG: Patient will perform lower body dressing with assist, with/without cues in positioning using equipment (OT)  Outcome: Not Applicable Date Met:  46/56/81  Goal discharged secondary to patient's extensive cognitive deficits. Plan of care to include shift in goals to be caregiver-focused as anticipate patient will discharge at dependent/totalA level for all function.     Problem: RH Toilet Transfers Goal: LTG Patient will perform toilet transfers w/assist (OT) LTG: Patient will perform toilet transfers with assist, with/without cues using equipment (OT)  Outcome: Not Applicable Date Met:  27/51/70  Goal discharged secondary to patient's extensive cognitive deficits. Plan of care to include shift in goals to be caregiver-focused as anticipate patient will discharge at dependent/totalA level for all function.     Problem: RH Memory Goal: LTG Patient will demonstrate ability for day to day (OT) LTG: Patient will demonstrate ability for day to day recall/carryover during activities of daily living with assist (OT)  Outcome: Not Applicable Date Met:  01/74/94  Goal discharged secondary to patient's extensive cognitive deficits. Plan of care to include shift in goals to be caregiver-focused as anticipate patient will discharge at dependent/totalA level for all function.     Problem: RH Awareness Goal: LTG: Patient will demonstrate intellectual/emergent (OT) LTG: Patient will demonstrate  intellectual/emergent/anticipatory awareness with assist during a functional activity (OT)  Outcome: Not Applicable Date Met:  49/67/59  Goal discharged secondary to patient's extensive cognitive deficits. Plan of care to include shift in goals to be caregiver-focused as anticipate patient will discharge at dependent/totalA level for all function.
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
## 2014-04-21 NOTE — Progress Notes (Signed)
Speech Language Pathology Daily Session Notes  Patient Details  Name: [name] MRN: [id number] Date of Birth: Feb 15, 1981  Today's Date: 04/21/2014  Session 1 Time: 1000-1030 Time Calculation (min): 30 min  Session 2 Time: 1420-1500 Time Calculation: 40 min  Short Term Goals: Week 1: SLP Short Term Goal 1 (Week 1): Patient will sustain attention for 30 seconds with Max multi-modal cues SLP Short Term Goal 2 (Week 1): Patient will initiate self-feeding with Max multi-modal cues  SLP Short Term Goal 3 (Week 1): Patient will follow 1-step commands in a structured, familiar task with Max assist   Skilled Therapeutic Interventions:  Session 1: Co-treatment with PT (BR). Patient received supine in bed, asleep, mom present. Patient easily aroused. Session focused on focused/sustained attention, initiation, and command following with functional mobility as well as cognitive remediation. Patient with incontinent episode, performed rolling to B sides in bed for doffing brief, hygiene, donning new brief and then dressing. Patient total A +2 for rolling and dressing. Patient's mom assisting with all aspects of care. Mechanical lift transfer bed>wheelchair with +2 for safety. SLP facilitated session by providing written words and phrases with focus on reading comprehension, attention, orientation, naming and speech intelligibility. Pt orally read aloud all words and phrases with 100% accuracy and associated/answered correctly with 75% accuracy. Pt increased her overall speech intelligibility throughout the task with Max multimodal cues. Pt also participated in card game where she had to read the numbers aloud and choose which one was higher from a field of two, pt completed task with 100% accuracy with mod-max cues for sustained attention. Additional emphasis on command following for inhibition of grasping/reaching/pinching reflex. Patient left seated in wheelchair at end of session with seatbelt  donned and mom present.   Session 2: Skilled treatment session focused on cognitive goals. Upon arrival, pt was sitting up in her wheelchair but was lethargic. SLP facilitated session by providing Max A multimodal cues for utilization of a slow rate and lower vocal intensity to increase speech intelligibility at the word level, however, pt was less intelligible this afternoon, suspect due to fatigue. Pt required multiple repetitions and increased wait time for verbal responses and was falling asleep intermittently throughout the session with cueing needed to maintain her eyes open, therefore, the session ended 20 minutes early and the pt was placed back to bed to rest. Continue with current plan of care.    FIM:  Comprehension Comprehension Mode: Auditory Comprehension: 3-Understands basic 50 - 74% of the time/requires cueing 25 - 50%  of the time Expression Expression Mode: Verbal Expression: 2-Expresses basic 25 - 49% of the time/requires cueing 50 - 75% of the time. Uses single words/gestures. Social Interaction Social Interaction: 1-Interacts appropriately less than 25% of the time. May be withdrawn or combative. Problem Solving Problem Solving: 1-Solves basic less than 25% of the time - needs direction nearly all the time or does not effectively solve problems and may need a restraint for safety Memory Memory: 1-Recognizes or recalls less than 25% of the time/requires cueing greater than 75% of the time  Pain Pain Assessment Pain Assessment: Faces Faces Pain Scale: Hurts little more  Therapy/Group: Individual Therapy  Buzzy Han 04/21/2014, 12:28 PM

## 2014-04-21 NOTE — Progress Notes (Signed)
Occupational Therapy Session Note  Patient Details  Name: [name] MRN: [id number] Date of Birth: 1981/09/19  Today's Date: 04/21/2014 Time: 1130-1200 Time Calculation (min): 30 min  Short Term Goals: Week 1:  OT Short Term Goal 1 (Week 1): Patient will complete 20% of grooming task with setup OT Short Term Goal 1 - Progress (Week 1): Discontinued (comment) (Discontinued STGs secondary to shift to focus on decreasing burden of care and caregiver training/education. Focus on LTGs) OT Short Term Goal 2 (Week 1): Patient will maintain supported sitting balance for 1 minute at edge of bed using bed rail. OT Short Term Goal 2 - Progress (Week 1): Discontinued (comment) OT Short Term Goal 3 (Week 1): Patient will tolerate 10 minutes of PROM of LUE with 1 rest break  OT Short Term Goal 3 - Progress (Week 1): Discontinued (comment) OT Short Term Goal 4 (Week 1): Patient will select appropriate grooming aid presented among 2 items with 1 verbal cue OT Short Term Goal 4 - Progress (Week 1): Discontinued (comment)  Skilled Therapeutic Interventions/Progress Updates:    Pt seen for 1:1 OT session with focus on passive ROM to LUE for hygiene needs and to decrease burden of care with dressing. Pt tolerated PROM to L elbow approx 30 degrees prolonged stretch. Encouraged pt to count during task with pt counting to 5-3x during session requiring therapist to initiate. Provided cleaning to hand, fingers, elbow, and underarm. Educated mother on importance of this for skin integrity and emphasis on drying completely. Pt with grasping tendencies throughout task. Pt left reclined back in w/c with mother present and all needs in reach.   Therapy Documentation Precautions:  Precautions Precautions: Fall Precaution Comments: severe contractures B ankles, L UE; high tone R LE Restrictions Weight Bearing Restrictions: No General:   Vital Signs:   Pain: Pain Assessment Pain Assessment: Faces Faces  Pain Scale: Hurts little more  See FIM for current functional status  Therapy/Group: Individual Therapy  Andron Marrazzo N Marilin Kofman 04/21/2014, 12:03 PM

## 2014-04-21 NOTE — Progress Notes (Signed)
Physical Therapy Session Note  Patient Details  Name: [name] MRN: [id number] Date of Birth: Jul 06, 1981  Today's Date: 04/21/2014 Time: 0930 ([place] with SLP (CP) 0930-1030)-1000 and 1300 ([place] with OT (KNP) 1300-1400)-1330 Time Calculation (min): 30 min and 30 min  Short Term Goals: Week 1:  PT Short Term Goal 1 (Week 1): Patient will be able to maintain sitting EOb for 3 min w/o LOB ,using R UE for support, and recieving mod A from therapist. PT Short Term Goal 1 - Progress (Week 1): Discontinued (comment) (goal not appropriate for patient's CLOF) PT Short Term Goal 2 (Week 1): Patient will be able to perform rolling to L side with min A . PT Short Term Goal 2 - Progress (Week 1): Discontinued (comment) (goal not appropriate for patient's CLOF) PT Short Term Goal 3 (Week 1): Patient will be able to maintain proper alligment when in w/c,self reposition head to mid line PT Short Term Goal 3 - Progress (Week 1): Discontinued (comment) (goal not appropriate for patient's CLOF) PT Short Term Goal 4 (Week 1): Patient will be able to perform Active ROM for LE 50% of trials, as a result of increasedd attentionand maintaining focus PT Short Term Goal 4 - Progress (Week 1): Discontinued (comment) (goal not appropriate for patient's CLOF) PT Short Term Goal 5 (Week 1): Patient will be able to reach for objects with 50% accuracy to increase alertness,attention and following instructions. PT Short Term Goal 5 - Progress (Week 1): Discontinued (comment) (goal not appropriate for patient's CLOF)  Skilled Therapeutic Interventions/Progress Updates:    AM Session: Patient received supine in bed, asleep, mom present. Patient easily aroused. Session focused on focused/sustained attention, initiation, and command following with functional mobility as well as cognitive remediation. Patient with incontinent episode, performed rolling to B sides in bed for doffing brief, hygiene, donning new brief and  then dressing. Patient totalA +2 for rolling and dressing. Patient's mom assisting with all aspects of care. Mechanical lift transfer bed>wheelchair with +2 for safety.  Cognitive remediation activities: reading 1-4 words/categories/questions written on dry erase board then asked to state word in that category or answer question (color, animal, where are you?, when's your birthday?, etc.). Patient read with 100% accuracy and improved intelligibility and able to state corresponding word 75% of trials with max cues for sustained attention. Emphasis on orientation questions. Patient states she is in "rehab" and states "[facility]" when provided cue of "[facility]...". Card game: Patient required to state numbers on 2 cards and which is higher, able to identify numbers with 100% accuracy with mod-max cues for sustained attention.  Additional emphasis on speech intelligibility with cues for slower speech and command following for inhibition of grasping/reaching/pinching reflex. Patient left seated in wheelchair at end of session with seatbelt donned and mom present. Brief education/demonstration of tilt function of wheelchair to patient's mom and education about pressure relief/positional changes every 20 min, verbalized understanding.  PM Session: Patient received sitting in wheelchair, mom present. Session focused on family education for daily stretching program in supine on mat. Education about assessment of patient's joints in regards to tone vs. Contractures; informed her she would be notified of results of x-rays and implications on PROM/stretching as soon as possible. Demonstrated tone management strategies for R hip and knee and PROM techniques for L LE to patient's mother. Patient's mom did not perform any PROM/stretching/tone management, just observed; mom seemed preoccupied tending to patient's crying out. Mom stated she verbalized understanding and would attempt tomorrow. Patient  left sitting in wheelchair with  mom present at end of session.  Therapy Documentation Precautions:  Precautions Precautions: Fall Precaution Comments: severe contractures B ankles, L UE; high tone R LE Restrictions Weight Bearing Restrictions: No Pain: Pain Assessment Pain Assessment: Faces Faces Pain Scale: Hurts little more Locomotion : Ambulation Ambulation/Gait Assistance: Not tested (comment)   See FIM for current functional status  Therapy/Group: Co-Treatment with SLP during AM session and OT during PM session  [place]. Armanii Pressnell, PT, DPT 04/21/2014, 11:59 AM

## 2014-04-21 NOTE — Progress Notes (Signed)
Social Work Patient ID: [name], female   DOB: January 30, 1981, 33 y.o.   MRN: [id number]  Met with pt's father on Tuesday following team conference and with mother yesterday to review team conf report, goals and LOS.  Father understands LOS @ 4 weeks pending progress, however, he does questions if more time could be added.  Addressed with both parents the justification we have to be able to make to extend a LOS and they state their understanding.  Both parents concerned about pt's poor sleep.  Have stressed to them that team shares this concern and that we are addressing this and monitoring.  Mother in yesterday and staying for next couple of days (parents alternating staying with pt q 2 days).  Able to have a long discussion with her about who her daughter is and the life she was living as a single mother of 3 children.  Mother also sharing stories about pt's own childhood and the drug addiction she reports both she and (per her) pt's father suffered with while she was growing up.  Mother states, "She is so strong now because she practically raised herself."  Mother expresses admiration for how pt has chosen to raise her own children and describes her as "...had her finger on everything they did..."  We spoke a little about how the children (ages 24, 30, 25) are dealing with their mother's injury and current, significant impairment.  Mother notes that the older children (daughters) have remained very involved in their sports activities and have many trips planned with "travel teams" and mission trips.  Eldest daughter planning to complete HS in Dec and will then move to [place] where she will start at a small college on a volleyball scholarship.   Son is staying with his father (daughters have no contact with their father) who lives in [place] and is very supportive of pt and her family.  Grandmother feels he is an "excellent" support to their 38 yo son.  Mother feels the son "..is having the hardest time"  and notes that he has good support from family, school and community. Discussed/ encouraged mother to schedule  for [facility] staff to meet with children to provide education and support.  She is agreeable with this.  Mother also reports that she does plan to have pt d/c home with her and that her husband is building a deck and completing home modifications for pt's return.  It was clear after, discussion with pt's mother, that there is some ongoing tension between herself and pt's father.  Each presenting slightly different picture of how "involved" they each felt the other person was with pt PTA.  Will encourage optimal discussion between the parents and will monitor that d/c planning issues are not becoming an issue.  Per mother, father now accepting of plan for pt to d/c to mother's home (he was not when I spoke with him).   Will continue to follow for support and d/c planning needs and referrals.  Lennart Pall, LCSW

## 2014-04-21 NOTE — Progress Notes (Signed)
Occupational Therapy Session Note  Patient Details  Name: [name] MRN: [id number] Date of Birth: [birth year]  Today's Date: 04/21/2014 Time: 1330 (co-tx with PT 1300-1400)-1400 Time Calculation (min): 30 min  Short Term Goals: Week 1:  OT Short Term Goal 1 (Week 1): Patient will complete 20% of grooming task with setup OT Short Term Goal 1 - Progress (Week 1): Discontinued (comment) (Discontinued STGs secondary to shift to focus on decreasing burden of care and caregiver training/education. Focus on LTGs) OT Short Term Goal 2 (Week 1): Patient will maintain supported sitting balance for 1 minute at edge of bed using bed rail. OT Short Term Goal 2 - Progress (Week 1): Discontinued (comment) OT Short Term Goal 3 (Week 1): Patient will tolerate 10 minutes of PROM of LUE with 1 rest break  OT Short Term Goal 3 - Progress (Week 1): Discontinued (comment) OT Short Term Goal 4 (Week 1): Patient will select appropriate grooming aid presented among 2 items with 1 verbal cue OT Short Term Goal 4 - Progress (Week 1): Discontinued (comment)  Skilled Therapeutic Interventions/Progress Updates:    Pt seen for skilled therapeutic co-treatment with PT (BR) with focus on PROM and passive stretching to BLE and family education. Pt received in w/c. Discussed discharge plans with mother with emphasis on initiating caregiver training and identifying primary caregivers. Transferred pt to mat table to complete passive stretching. Emphasis on providing PROM to individual joints for increase in comfort to pt. Demonstrated tone management strategies for R hip and knee and PROM techniques for L LE to patient's mother. Patient's mom did not perform any PROM/stretching/tone management, just observed; mom seemed preoccupied tending to patient's crying out. Mom stated she verbalized understanding and would attempt tomorrow. Discussed x-rays and awaiting results before proving extensive ROM to LUE. Also discussed  options for hand splint for contracture management (i.e. Carrot, teri cone). Patient left sitting in wheelchair with mom present at end of session.   Therapy Documentation Precautions:  Precautions Precautions: Fall Precaution Comments: severe contractures B ankles, L UE; high tone R LE Restrictions Weight Bearing Restrictions: No General:   Vital Signs:   Pain: Pain Assessment Pain Assessment: Faces Faces Pain Scale: Hurts little more  See FIM for current functional status  Therapy/Group: Co-Treatment  Laney Louderback N Hamza Empson 04/21/2014, 2:11 PM

## 2014-04-22 ENCOUNTER — Inpatient Hospital Stay (HOSPITAL_COMMUNITY): Payer: No Typology Code available for payment source

## 2014-04-22 ENCOUNTER — Inpatient Hospital Stay (HOSPITAL_COMMUNITY): Payer: No Typology Code available for payment source | Admitting: *Deleted

## 2014-04-22 ENCOUNTER — Inpatient Hospital Stay (HOSPITAL_COMMUNITY): Payer: No Typology Code available for payment source | Admitting: Speech Pathology

## 2014-04-22 DIAGNOSIS — S069X9A Unspecified intracranial injury with loss of consciousness of unspecified duration, initial encounter: Secondary | ICD-10-CM

## 2014-04-22 DIAGNOSIS — S42309B Unspecified fracture of shaft of humerus, unspecified arm, initial encounter for open fracture: Secondary | ICD-10-CM

## 2014-04-22 DIAGNOSIS — R1310 Dysphagia, unspecified: Secondary | ICD-10-CM

## 2014-04-22 DIAGNOSIS — S069XAA Unspecified intracranial injury with loss of consciousness status unknown, initial encounter: Secondary | ICD-10-CM

## 2014-04-22 DIAGNOSIS — G8250 Quadriplegia, unspecified: Secondary | ICD-10-CM
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
## 2014-04-22 NOTE — Progress Notes (Signed)
Physical Therapy Session Note  Patient Details  Name: [name] MRN: [id number] Date of Birth: 1981/09/06  Today's Date: 04/22/2014 Time: 0930-1030 Time Calculation (min): 60 min  Short Term Goals: Week 1:  PT Short Term Goal 1 (Week 1): Patient will be able to maintain sitting EOb for 3 min w/o LOB ,using R UE for support, and recieving mod A from therapist. PT Short Term Goal 1 - Progress (Week 1): Discontinued (comment) (goal not appropriate for patient's CLOF) PT Short Term Goal 2 (Week 1): Patient will be able to perform rolling to L side with min A . PT Short Term Goal 2 - Progress (Week 1): Discontinued (comment) (goal not appropriate for patient's CLOF) PT Short Term Goal 3 (Week 1): Patient will be able to maintain proper alligment when in w/c,self reposition head to mid line PT Short Term Goal 3 - Progress (Week 1): Discontinued (comment) (goal not appropriate for patient's CLOF) PT Short Term Goal 4 (Week 1): Patient will be able to perform Active ROM for LE 50% of trials, as a result of increasedd attentionand maintaining focus PT Short Term Goal 4 - Progress (Week 1): Discontinued (comment) (goal not appropriate for patient's CLOF) PT Short Term Goal 5 (Week 1): Patient will be able to reach for objects with 50% accuracy to increase alertness,attention and following instructions. PT Short Term Goal 5 - Progress (Week 1): Discontinued (comment) (goal not appropriate for patient's CLOF)  Skilled Therapeutic Interventions/Progress Updates:    Patient received supine in bed. Session focused on increasing tolerance with transitional movements, sitting tolerance, bed mobility, cognitive remediation, and PROM of limbs. Provided prolonged stretching to RLE with emphasis on relaxation prior to bed mobility. Engaged in bed mobility with emphasis on cervical flexion to allow head movements to lead body, increasing patient's tolerance of transitional movements. Utilized pillows, sheets,  and swaddling techniques to provide proprioceptive input to decrease patient's resistance/guarding and anxiety.   Engaged in sitting EOB with total-max assist for support and providing sensory input to increase comfort. Provided manual facilitation for increasing optimal positioning/tone management of RLE. Placed table in front of pt with BUE on table. Engaged in cognitive task of following simple commands (close eyes, stick out tongue, etc.) with patient following commands 1/5 attempts. Patient left in L sidelying with pillows for optimal positioning, bed alarm on, and 3 rails up.  Therapy Documentation Precautions:  Precautions Precautions: Fall Precaution Comments: severe contractures B ankles, L UE; high tone R LE Restrictions Weight Bearing Restrictions: No Pain: Pain Assessment Pain Assessment: Faces Faces Pain Scale: Hurts little more Locomotion : Ambulation Ambulation/Gait Assistance: Not tested (comment)   See FIM for current functional status  Therapy/Group: Individual Therapy  [name]. Sonny Anthes, PT, DPT 04/22/2014, 12:24 PM

## 2014-04-22 NOTE — Progress Notes (Signed)
Occupational Therapy Session Note  Patient Details  Name: [name] MRN: [id number] Date of Birth: 1981-09-27  Today's Date: 04/22/2014 Time: 0800-0908 Time Calculation (min): 68 min  Short Term Goals: Week 1:  OT Short Term Goal 1 (Week 1): Patient will complete 20% of grooming task with setup OT Short Term Goal 1 - Progress (Week 1): Discontinued (comment) (Discontinued STGs secondary to shift to focus on decreasing burden of care and caregiver training/education. Focus on LTGs) OT Short Term Goal 2 (Week 1): Patient will maintain supported sitting balance for 1 minute at edge of bed using bed rail. OT Short Term Goal 2 - Progress (Week 1): Discontinued (comment) OT Short Term Goal 3 (Week 1): Patient will tolerate 10 minutes of PROM of LUE with 1 rest break  OT Short Term Goal 3 - Progress (Week 1): Discontinued (comment) OT Short Term Goal 4 (Week 1): Patient will select appropriate grooming aid presented among 2 items with 1 verbal cue OT Short Term Goal 4 - Progress (Week 1): Discontinued (comment)  Skilled Therapeutic Interventions/Progress Updates:    Pt seen for skilled occupational therapy intervention with focus on increasing tolerance with transitional movements, sitting tolerance, self-feeding, bed mobility, cognitive remediation, and UE ROM. Pt received supine in bed with mother present, reporting she had already completed bathing and dressing. Provided prolonged stretching to RLE with emphasis on relaxation prior to bed mobility. Engaged in bed mobility with emphasis on cervical flexion to allow head movements to lead body, increasing pt's tolerance of transitional movements. Utilized pillows, sheets, etc (simulating swaddling) to provide proprioceptive input to decrease pt's resistance. Completed supine>sit with +2 assist. Engaged in functional activities sitting EOB with therapist providing total-max assist for support and providing sensory input to increase comfort.  Providing manual facilitation for increasing optimal positioning of RLE.  Placed table in front of pt with BUE on table. Completed slight ROM at LUE with focus at shoulder while UE supported on table. Engaged in cognitive task of following simple commands (pressing button to turn on/off, discussion of children and names). Pt following commands approx 50% of time and accurately answering questions approx 20% of time. Engaged in ice chip trials with emphasis on focused attention, following simple commands, and self-feeding. Pt required total assist for self-feeding with max cues for grading movements. At end of session pt returned to supine in bed with all needs reach.    Therapy Documentation Precautions:  Precautions Precautions: Fall Precaution Comments: severe contractures B ankles, L UE; high tone R LE Restrictions Weight Bearing Restrictions: No General:   Vital Signs:   Pain: Pain Assessment Pain Assessment: Faces Faces Pain Scale: No hurt  See FIM for current functional status  Therapy/Group: Individual Therapy  Martine Trageser N Quang Thorpe 04/22/2014, 11:24 AM

## 2014-04-22 NOTE — Progress Notes (Signed)
Residual noted at 2053: 70 ml. Patient comfortably lying in bed. Denied abdominal pain, or any discomfort. VS: 118/82, 98.6, 95, 20, 100% RA. Reesa Chew, PA made aware. Per order, feeding continues to run.   Residual checked at 0109: 250 ml. Spoke to [employer], [place]. @ [phone]. Tube feeding put on hold till the morning, as per order. Brandyn Lowrey A. Malcomb Gangemi, RN.

## 2014-04-22 NOTE — Progress Notes (Signed)
Speech Language Pathology Daily Session Note  Patient Details  Name: [name] MRN: [id number] Date of Birth: 12-Aug-1981  Today's Date: 04/22/2014 Time: 1445-1530 Time Calculation (min): 45 min  Short Term Goals: Week 1: SLP Short Term Goal 1 (Week 1): Patient will sustain attention for 30 seconds with Max multi-modal cues SLP Short Term Goal 2 (Week 1): Patient will initiate self-feeding with Max multi-modal cues  SLP Short Term Goal 3 (Week 1): Patient will follow 1-step commands in a structured, familiar task with Max assist   Skilled Therapeutic Interventions: Skilled treatment session focused on cognitive and speech goals. SLP facilitated session by providing Max A multimodal cues to increase speech intelligibility at the word and phrase level.  Pt appeared more lethargic this afternoon and required increased wait time and repetitions for verbal responses.  Pt's father present and asked appropriate question in regards to the pt's current cognitive-linguistic function and behavior. Pt's father educated on ways to increase pt's auditory comprehension and verbal appropriate cueing for increased speech intelligibility and ways to decrease stimulation. Pt's father verbalized understanding. Continue with current plan of care.    FIM:  Comprehension Comprehension: 2-Understands basic 25 - 49% of the time/requires cueing 51 - 75% of the time Expression Expression: 1-Expresses basis less than 25% of the time/requires cueing greater than 75% of the time. Social Interaction Social Interaction: 1-Interacts appropriately less than 25% of the time. May be withdrawn or combative. Problem Solving Problem Solving: 1-Solves basic less than 25% of the time - needs direction nearly all the time or does not effectively solve problems and may need a restraint for safety Memory Memory: 1-Recognizes or recalls less than 25% of the time/requires cueing greater than 75% of the time  Pain No s/s of  pain   Therapy/Group: Individual Therapy  Buzzy Han 04/22/2014, 4:30 PM

## 2014-04-22 NOTE — Progress Notes (Signed)
Physical Therapy Note  Patient Details  Name: [name] MRN: [id number] Date of Birth: 1981/08/11 Today's Date: 04/22/2014  Patient missed 20 minutes of skilled therapy this PM secondary to fatigue and lethargy. Allowed patient time to rest in bed without stimulation in hopes of increased alertness for speech therapy session later this PM.   [name]. Roslind Michaux, PT, DPT 04/22/2014, 2:11 PM

## 2014-04-22 NOTE — Progress Notes (Signed)
Occupational Therapy Note  Patient Details  Name: BRAILEE RIEDE MRN: [id number] Date of Birth: [birth year] Today's Date: 04/22/2014  Patient missing 25 min skilled therapy (co-treatment with PT-BR for 45 min) secondary to patient fatigue. Patient received supine in bed asleep with father present. Patient required increased time for arousal and having difficulty staying awake. Allowed patient to rest longer to allow for increased participation in speech therapy session this PM. Patient left supine in bed with all needs in reach and patient asleep as therapist exited room.    Branston Halsted N Chani Ghanem 04/22/2014, 2:12 PM

## 2014-04-23 ENCOUNTER — Inpatient Hospital Stay (HOSPITAL_COMMUNITY): Payer: No Typology Code available for payment source

## 2014-04-23 DIAGNOSIS — R1310 Dysphagia, unspecified: Secondary | ICD-10-CM

## 2014-04-23 DIAGNOSIS — S42309B Unspecified fracture of shaft of humerus, unspecified arm, initial encounter for open fracture: Secondary | ICD-10-CM

## 2014-04-23 DIAGNOSIS — S069X9A Unspecified intracranial injury with loss of consciousness of unspecified duration, initial encounter: Secondary | ICD-10-CM

## 2014-04-23 DIAGNOSIS — S069XAA Unspecified intracranial injury with loss of consciousness status unknown, initial encounter: Secondary | ICD-10-CM

## 2014-04-23 DIAGNOSIS — G8250 Quadriplegia, unspecified: Secondary | ICD-10-CM

## 2014-04-23 LAB — URINALYSIS, ROUTINE W REFLEX MICROSCOPIC
Bilirubin Urine: NEGATIVE
GLUCOSE, UA: NEGATIVE mg/dL
Hgb urine dipstick: NEGATIVE
KETONES UR: NEGATIVE mg/dL
Nitrite: NEGATIVE
PROTEIN: NEGATIVE mg/dL
Specific Gravity, Urine: 1.017 (ref 1.005–1.030)
UROBILINOGEN UA: 0.2 mg/dL (ref 0.0–1.0)
pH: 7.5 (ref 5.0–8.0)

## 2014-04-23 LAB — URINE MICROSCOPIC-ADD ON

## 2014-04-23 MED ORDER — BACLOFEN 20 MG PO TABS
20.0000 mg | ORAL_TABLET | Freq: Four times a day (QID) | ORAL | Status: DC
  Administered 2014-04-23 – 2014-05-11 (×73): 20 mg
  Filled 2014-04-23 (×78): qty 1

## 2014-04-23 NOTE — Progress Notes (Deleted)
Feeding on hold at 0309 for residual of 125 ml.Marland KitchenMarland KitchenMarland KitchenWaade A. Raequon Catanzaro, RN.

## 2014-04-23 NOTE — Progress Notes (Signed)
Occupational Therapy Session Note  Patient Details  Name: [name] MRN: [id number] Date of Birth: 01-12-1981  Today's Date: 04/23/2014 Time:  -   1615--1715   (48min)    Short Term Goals: Week 1:  OT Short Term Goal 1 (Week 1): Patient will complete 20% of grooming task with setup OT Short Term Goal 1 - Progress (Week 1): Discontinued (comment) (Discontinued STGs secondary to shift to focus on decreasing burden of care and caregiver training/education. Focus on LTGs) OT Short Term Goal 2 (Week 1): Patient will maintain supported sitting balance for 1 minute at edge of bed using bed rail. OT Short Term Goal 2 - Progress (Week 1): Discontinued (comment) OT Short Term Goal 3 (Week 1): Patient will tolerate 10 minutes of PROM of LUE with 1 rest break  OT Short Term Goal 3 - Progress (Week 1): Discontinued (comment) OT Short Term Goal 4 (Week 1): Patient will select appropriate grooming aid presented among 2 items with 1 verbal cue OT Short Term Goal 4 - Progress (Week 1): Discontinued (comment) Week 2:     Skilled Therapeutic Interventions/Progress Updates:     Pt seen for skilled occupational therapy intervention with focus on increasing tolerance with transitional movements, sitting tolerance,, bed mobility, cognitive remediation, and UE ROM. Pt received supine in bed with Father present>  . Provided prolonged stretching to RLE with emphasis on relaxation prior to bed mobility. Engaged in bed mobility with emphasis on  increasing pt's tolerance of transitional movements. Utilized massage , myotherapy to provide proprioceptive input to decrease pt's resistance>  Pt. Maintained focused attention for 30 sec for1 minute before yelling and moving with no purpose>  Dad had calming effect on pt and utilized counting to get her to keep LLE in extension for 15-20 minutes.  Did not do any ROM to LUE due to Dad stated shoulder was dislocated and not to move.  Pt maintained eye contact for 20-30  seconds.  Provided stretching to neck and upper traps for increased rotating head to left.     Therapy Documentation Precautions:  Precautions Precautions: Fall Precaution Comments: severe contractures B ankles, L UE; high tone R LE Restrictions Weight Bearing Restrictions: No     Pain:  General pain but pt unable to identify   ADL: ADL ADL Comments: see FIM     See FIM for current functional status  Therapy/Group: Individual Therapy  Lisa Roca 04/23/2014, 5:20 PM

## 2014-04-23 NOTE — Progress Notes (Signed)
Patient running low grade temp: 99.5 @ 0514; 99.1 @ 0615. Dr. [name] made aware @ [phone]. States he'll will take care of it when he gets here. Andrya Roppolo A. Lars Jeziorski, RN.
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
## 2014-04-24 ENCOUNTER — Inpatient Hospital Stay (HOSPITAL_COMMUNITY): Payer: No Typology Code available for payment source

## 2014-04-24 ENCOUNTER — Inpatient Hospital Stay (HOSPITAL_COMMUNITY): Payer: No Typology Code available for payment source | Admitting: Physical Therapy

## 2014-04-24 MED ORDER — HYDROCODONE-ACETAMINOPHEN 5-325 MG PO TABS
1.0000 | ORAL_TABLET | Freq: Two times a day (BID) | ORAL | Status: DC
Start: 2014-04-25 — End: 2014-05-11
  Administered 2014-04-25 – 2014-05-11 (×34): 1
  Filled 2014-04-24 (×34): qty 1

## 2014-04-24 NOTE — Progress Notes (Signed)
Physical Therapy Session Note  Patient Details  Name: [name] MRN: [id number] Date of Birth: [birth year]  Today's Date: 04/24/2014 Time: 0900-1000 Time Calculation (min): 60 min  Skilled Therapeutic Interventions/Progress Updates:    Patient received supine in bed, father present for half of session. Session focused on increasing tolerance with transitional movements, bed mobility, and PROM BLEs/LUE for tone reduction and improved positioning. Provided prolonged stretching to RLE with emphasis on relaxation prior to bed mobility. PROM to LLE. Prolonged stretching to L elbow (deep pressure to bicep tendon) and wrist/fingers. Pt crying out consistently approx every 30-60 seconds. Father stating "she seems weird" and asking about medication's impact on therapy. Also voicing concern over turning schedule -believes pt needs to be turned more regularly and reporting that she will be going to her own apartment at d/c. RN notified of turning schedule concern and father referred to [place] for further d/c planning (latest note seen by this PT reporting that mom plans for patient to go to her house). Also referred father to RN/MD/PA in regards to medication. Patient performed rolling to B sides to don pants and place lift sling, requires max-total A x 2. Mechanical lift transfer bed > wheelchair with +2 assist for safety. Provided education to father for strategies to improve positioning, maintain midline and decrease R lateral lean, and attending to L with patient sitting in w/c. Pt left sitting in w/c with RLE in more relaxed position although still with hip abd/ER, with father present.   Therapy Documentation Precautions:  Precautions Precautions: Fall Precaution Comments: severe contractures B ankles, L UE; high tone R LE Restrictions Weight Bearing Restrictions: No  See FIM for current functional status  Therapy/Group: Individual Therapy  Laretta Alstrom 04/24/2014, 10:06 AM
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
## 2014-04-24 NOTE — Progress Notes (Signed)
Occupational Therapy Session Note  Patient Details  Name: [name] MRN: [id number] Date of Birth: 27-Apr-1981  Today's Date: 04/24/2014 Time:  - 1300-1400  (60 min)    Short Term Goals: Week 1:  OT Short Term Goal 1 (Week 1): Patient will complete 20% of grooming task with setup OT Short Term Goal 1 - Progress (Week 1): Discontinued (comment) (Discontinued STGs secondary to shift to focus on decreasing burden of care and caregiver training/education. Focus on LTGs) OT Short Term Goal 2 (Week 1): Patient will maintain supported sitting balance for 1 minute at edge of bed using bed rail. OT Short Term Goal 2 - Progress (Week 1): Discontinued (comment) OT Short Term Goal 3 (Week 1): Patient will tolerate 10 minutes of PROM of LUE with 1 rest break  OT Short Term Goal 3 - Progress (Week 1): Discontinued (comment) OT Short Term Goal 4 (Week 1): Patient will select appropriate grooming aid presented among 2 items with 1 verbal cue OT Short Term Goal 4 - Progress (Week 1): Discontinued (comment)      Skilled Therapeutic Interventions/Progress Updates:    Mom present upon OT arrival.  Pt. Lying in bed.  Pt. Seen for skilled Occupational therapy intervention with focus on transitional movements, sitting tolerance, sitting balance EOB, weight shifting, mobilization to RLE and trunk and scapula on left, attention to task.  Provided stretching to RLE.  Mom stated no PROM to LUE.  Sat pt EOB with total assist +2.  Provided trunk mobilization to pelvis in all directions.  Also addressed head neutral position and trunk elongation.  Pt. Tolerated mobilizations with no increased agitation.  Mom provided soothing conversation while OT did trunk and pelvis work.   Pt would follow 1 step commands (put right hand on pillow) with minimal assist and maintain for 5 seconds.  Pt constantly providing tactile, proprioceptive input to mouth, left jaw teeth during session.  Placed pt back to supine position with  good positioning.       Therapy Documentation Precautions:  Precautions Precautions: Fall Precaution Comments: severe contractures B ankles, L UE; high tone R LE Restrictions Weight Bearing Restrictions: No      Pain:  Pt has general yelling but indicates mild pain all over.  Unable to accurately assess.     ADL ADL Comments: see FIM Exercises:   Other Treatments:    See FIM for current functional status  Therapy/Group: Individual Therapy  Lisa Roca 04/24/2014, 7:40 PM

## 2014-04-24 NOTE — Plan of Care (Signed)
Problem: RH PAIN MANAGEMENT Goal: RH STG PAIN MANAGED AT OR BELOW PT'S PAIN GOAL <4  Changing pain meds with scheduled meds for therapy sessions

## 2014-04-25 ENCOUNTER — Inpatient Hospital Stay (HOSPITAL_COMMUNITY): Payer: Self-pay

## 2014-04-25 ENCOUNTER — Inpatient Hospital Stay (HOSPITAL_COMMUNITY): Payer: No Typology Code available for payment source

## 2014-04-25 ENCOUNTER — Inpatient Hospital Stay (HOSPITAL_COMMUNITY): Payer: No Typology Code available for payment source | Admitting: *Deleted

## 2014-04-25 LAB — CARBAMAZEPINE LEVEL, TOTAL: Carbamazepine Lvl: 5 ug/mL (ref 4.0–12.0)

## 2014-04-25 MED ORDER — QUETIAPINE FUMARATE 50 MG PO TABS
50.0000 mg | ORAL_TABLET | Freq: Two times a day (BID) | ORAL | Status: DC
  Administered 2014-04-25 – 2014-05-11 (×33): 50 mg via ORAL
  Filled 2014-04-25 (×34): qty 1

## 2014-04-25 NOTE — Progress Notes (Signed)
Speech Language Pathology Weekly Progress and Session Note  Patient Details  Name: [name] MRN: [id number] Date of Birth: 01-Sep-1981  Beginning of progress report period: Apr 16, 2014 End of progress report period: Apr 25, 2014  Today's Date: 04/25/2014 Time: 0900-0930 Time Calculation (min): 30 min  Short Term Goals: Week 1: SLP Short Term Goal 1 (Week 1): Patient will sustain attention for 30 seconds with Max multi-modal cues SLP Short Term Goal 1 - Progress (Week 1): Not met SLP Short Term Goal 2 (Week 1): Patient will initiate self-feeding with Max multi-modal cues  SLP Short Term Goal 2 - Progress (Week 1): Not met SLP Short Term Goal 3 (Week 1): Patient will follow 1-step commands in a structured, familiar task with Max assist  SLP Short Term Goal 3 - Progress (Week 1): Not met    New Short Term Goals: Week 2: SLP Short Term Goal 1 (Week 2): Patient will sustain attention for 15 seconds with Max multi-modal cues SLP Short Term Goal 2 (Week 2): Patient will initiate self-feeding with Max multi-modal cues  SLP Short Term Goal 3 (Week 2): Patient will follow 1-step commands in a structured, familiar task with Max assist  SLP Short Term Goal 4 (Week 2): Pt will increase speech intelligiblity at the word level to 50% with Max A multimodal cues.  SLP Short Term Goal 5 (Week 2): Pt will consume ice chip trials without overt s/s of aspiration with Mod A verbal cues.   Weekly Progress Updates: Patient has met 0 of 3 short-term goals this reporting due to patient's CLOF and anticipated discharge at total A/dependent level of function. Emphasis during first reporting period on rehab has been on functional communication, swallowing function, extensive family education, OOB tolerance, behavioral modifications, decreasing burden of care, discharge planning/home management, and strategies/techniques to decrease agitation and improve participation in self-care activities. Currently, pt is  demonstrating behaviors consistent with a [place]o Level IV and requires total A for all tasks. Pt would benefit from continued skilled SLP intervention to decrease burden of care prior to discharge.    Intensity: Minumum of 1-2 x/day, 30 to 90 minutes Frequency: 5 out of 7 days Duration/Length of Stay: TBD based on progress, 2-3 weeks Treatment/Interventions: Cognitive remediation/compensation;Cueing hierarchy;Dysphagia/aspiration precaution training;Environmental controls;Functional tasks;Internal/external aids;Patient/family education;Speech/Language facilitation;Therapeutic Activities   Daily Session Skilled Therapeutic Interventions: Co-treatment with OT with focus on proper postioning for PO intake, self-feeding, functional communication and cognitive remediation. Pt with increased anxiety and grabbing behaviors today throughout functional tasks. Pt also demonstrated increased yelling of verbal responses and was less than 25% intelligible today, however, mother reports 100% intelligibility.  Pt completed oral care sitting EOB requiring mod cues for initiation and thoroughness with task. Pt consumed ~10 trials of ice cubes today without overt s/s of aspiration but required intermittent verbal cues to initiate a swallow. Pt also required total A for increased motor control with self-feeding task. Pt also required max assist for cervical extension to keep head upright during task. At end of session pt returned to supine and left with all needs in reach. Continue with current plan of care.     FIM:  Comprehension Comprehension Mode: Auditory Comprehension: 1-Understands basic less than 25% of the time/requires cueing 75% of the time Expression Expression Mode: Verbal Expression: 1-Expresses basis less than 25% of the time/requires cueing greater than 75% of the time. Social Interaction Social Interaction: 1-Interacts appropriately less than 25% of the time. May be withdrawn or combative. Problem  Solving  Problem Solving: 1-Solves basic less than 25% of the time - needs direction nearly all the time or does not effectively solve problems and may need a restraint for safety Memory Memory: 1-Recognizes or recalls less than 25% of the time/requires cueing greater than 75% of the time FIM - Eating Eating Activity: 1: Helper performs IV, parenteral, or tube feeding Pain Intermittent pain with postioning, RN made aware   Therapy/Group: Individual Therapy  Buzzy Han 04/25/2014, 4:19 PM

## 2014-04-25 NOTE — Progress Notes (Signed)
Physical Therapy Weekly Progress Note  Patient Details  Name: [name] MRN: [id number] Date of Birth: January 25, 1981  Beginning of progress report period: Apr 16, 2014 End of progress report period: Apr 25, 2014  Today's Date: 04/25/2014 Time: 1000-1100 Time Calculation (min): 60 min  Patient has met 0 of 4 long term goals.  Short term goals not set due to patient's CLOF and anticipated discharge at totalA/dependent level of function. Emphasis during first reporting period on rehab has been on identifying deficits, extensive family education, OOB tolerance, behavioral modifications, decreasing burden of care, discharge planning/home management, and strategies/techniques to decrease agitation and improve participation in functional mobility and self-care activities. Emphasis of this week on wheelchair evaluation with ATP and trials with specialty wheelchairs to observe patient tolerance.  Patient continues to demonstrate the following deficits: high tone/spasticity, multiple contractures, poor motor planning/control, poor activity tolerance, decreased sitting balance, decreased postural control in all positions, decreased functional use of all extremities, decreased focused/sustained attention, decreased awareness/insight into deficits, decreased coordination, decreased cognitive-linguistic skills and therefore will continue to benefit from skilled PT intervention to enhance overall performance with activity tolerance, balance, postural control, ability to compensate for deficits, functional use of  right upper extremity, right lower extremity, left upper extremity and left lower extremity, attention, awareness, coordination and knowledge of precautions.  See Patient's Care Plan for progression toward long term goals.  Patient not progressing toward long term goals.  See goal revision..  Plan of care revisions: LTGs of totalA/dependent secondary to patient's CLOF and anticipated discharge at  totalA/dependent level of function..  Skilled Therapeutic Interventions/Progress Updates:    Patient received semi-reclined in bed, mother present. Session focused on family education/implementation of daily stretching routine. Patient's mother return-demonstrated proper stretching/tone management techniques for R LE and PROM/stretching of L LE, cues provided for optimal stretching, body mechanics, and various techniques for hand positions. Additionally, education on patient signs of stretching tolerance, not stretching all joints simultaneously, and proper amount of stretching (how many times per day, how long each session, etc.). Patient's mom asking appropriate questions. Patient +2 for rolling to place lift sling and transferred bed>wheelchair via [employer] and +2 for safety, left sitting up in wheelchair for next therapy session.  Therapy Documentation Precautions:  Precautions Precautions: Fall Precaution Comments: severe contractures B ankles, L UE; high tone R LE Restrictions Weight Bearing Restrictions: No Pain: Pain Assessment Pain Assessment: Faces Faces Pain Scale: Hurts even more Locomotion : Ambulation Ambulation/Gait Assistance: Not tested (comment)   See FIM for current functional status  Therapy/Group: Individual Therapy  [name]. Jams Trickett, PT, DPT 04/25/2014, 12:10 PM

## 2014-04-25 NOTE — Plan of Care (Signed)
Problem: RH Balance Goal: LTG: Patient will maintain dynamic sitting balance (OT) LTG: Patient will maintain dynamic sitting balance with assistance during activities of daily living (OT)  Patient will tolerate sitting EOB with caregiver in order to complete 1 ADL task.

## 2014-04-25 NOTE — Progress Notes (Signed)
Occupational Therapy Weekly Progress Note  Patient Details  Name: [name] MRN: [id number] Date of Birth: 1981-11-17  Beginning of progress report period: Apr 16, 2014 End of progress report period: Apr 25, 2014  Today's Date: 04/25/2014 Time: 0830 (co-tx with SLP [phone] and 1130-1200 and [phone] Time Calculation (min): 30 min and 30 min and 48 min   Patient has met 0 of 3 long term goals.  Short term goals not set due to patient's CLOF and anticipated discharge plans of total assist/dependent. Focus of therapy for this reporting period has been family education, positioning, cognitive remediation, identifying deficits, OOB tolerance, behavioral modifications, and decreasing burden of care for discharge planning.  Patient continues to demonstrate the following deficits: high tone/spasticity, poor postural control in sitting, contractures, poor motor planning/coordination, decreased activity tolerance, decreased sustained attention, decreased strength, impaired overall cognition, functional use of all extremities and therefore will continue to benefit from skilled OT intervention to enhance overall performance with Reduce care partner burden, OOB tolerance, sustained attention, functional use of RUE.  See Patient's Care Plan for progression toward long term goals.  Patient not progressing toward long term goals.  See goal revision..  Plan of care revisions: Total assist/dependent with self-care tasks. Focus on decreasing caregiver burden.Marland Kitchen Discharged bathing, UB dressing, toileting, and toilet transfer goals.   Skilled Therapeutic Interventions/Progress Updates:    Session 1: Pt seen for skilled therapeutic co-treatment with SLP (CP) with focus on attention to task, bed mobility, positioning of RLE, ROM to LUE, initiation, increasing tolerance with transitional movements. Patient received supine in bed. Engaged in prolonged stretching to RLE to facilitate tolerance during bed  mobility and transitional movements. Completed rolling in bed with emphasis on increasing tolerance in side lying on L side to decrease burden of care during LB self-care. Pt with increased anxiety and grabbing behaviors during transitional movements, however relaxed once on L side. Completed supine>sit +2 assist. Pt tolerated sitting EOB approx 40 min with therapist providing rocking motion initially to increase proprioceptive input and facilitate calming. Completed oral care sitting EOB requiring mod cues for completion of task. Engaged in ice chips trials with SLP and focus on following simple command during feeding. Pt required max assist for cervical extension to keep head upright during task. At end of session pt returned to supine and left with all needs in reach.   Session 2: Pt seen for 1:1 OT session with focus on family education for hand hygiene, sustained attention, and cognitive remediation. Pt received in recliner w/c with mother present. Provided demonstration and explanation of hand hygiene for pt's L hand (increased tone and contractures). Mother returned demonstration. Discussed importance of completing hygiene and frequency of this. Engaged therapeutic activity with focus on following one-step command and sustained attention. Pt following one-step command <10% of time and limited sustained attention 3-5 seconds. Pt left sitting in w/c with all needs in reach. Pt continues to demonstrated multiple occasions of blank stare with left eye twitching. Pt does not respond to verbal or tactile cues during these episodes.   Session 3: Pt seen for skilled OT session with focus on postural control in sitting, sitting tolerance, family education/training, sustained attention, cognitive remediation. Pt received sitting in recliner w/c with mother present. Engaged in family training/education with mother, focusing on sitting balance. Mother assisted with providing total assist to pt while sitting EOB.  Placed table in front of pt for BUE positioning. Engaged in rocking motion for increasing ROM to L shoulder and increasing  comfort. Discussed using table technique at home for washing in bilateral arm pits to decrease burden of care. Mother reporting feeling comfortable with this technique and will have other family members to assist.  Engaged in therapeutic activities sitting EOB (applying lotion, washing facing, word identification). Pt very fatigued throughout task, requiring max cues for attention. Placed wash cloth over face and pt washing approx 20% of face on 1 attempt. Pt unable to successfully complete cognitive task due to fatigue and increase in physical demand with sitting EOB. Pt assisted sit>supine and provided optimal positioning. Providing prolonged stretching to RLE as pt falling asleep and pt left with improved positioning of RLE in bed. Pt left with mother present and all needs in reach.  Therapy Documentation Precautions:  Precautions Precautions: Fall Precaution Comments: severe contractures B ankles, L UE; high tone R LE Restrictions Weight Bearing Restrictions: No General:   Vital Signs:   Pain: Pain Assessment Pain Assessment: Faces Faces Pain Scale: Hurts even more  See FIM for current functional status  Therapy/Group: Individual Therapy and Co-Treatment  Aroldo Galli N Brinlyn Cena 04/25/2014, 2:11 PM

## 2014-04-25 NOTE — Plan of Care (Signed)
Problem: RH Dressing Goal: LTG Patient will perform lower body dressing w/assist (OT) LTG: Patient will perform lower body dressing with assist, with/without cues in positioning using equipment (OT)  Patient will tolerate rolling to left side for 10 seconds during LB dressing task.
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
## 2014-04-26 ENCOUNTER — Inpatient Hospital Stay (HOSPITAL_COMMUNITY): Payer: No Typology Code available for payment source | Admitting: *Deleted

## 2014-04-26 ENCOUNTER — Inpatient Hospital Stay (HOSPITAL_COMMUNITY): Payer: No Typology Code available for payment source | Admitting: Speech Pathology

## 2014-04-26 ENCOUNTER — Encounter (HOSPITAL_COMMUNITY): Payer: Self-pay

## 2014-04-26 ENCOUNTER — Inpatient Hospital Stay (HOSPITAL_COMMUNITY): Payer: No Typology Code available for payment source

## 2014-04-26 DIAGNOSIS — S069X9A Unspecified intracranial injury with loss of consciousness of unspecified duration, initial encounter: Secondary | ICD-10-CM

## 2014-04-26 DIAGNOSIS — G8250 Quadriplegia, unspecified: Secondary | ICD-10-CM

## 2014-04-26 DIAGNOSIS — S069XAA Unspecified intracranial injury with loss of consciousness status unknown, initial encounter: Secondary | ICD-10-CM

## 2014-04-26 DIAGNOSIS — S42309B Unspecified fracture of shaft of humerus, unspecified arm, initial encounter for open fracture: Secondary | ICD-10-CM

## 2014-04-26 DIAGNOSIS — R1310 Dysphagia, unspecified: Secondary | ICD-10-CM

## 2014-04-26 LAB — PREALBUMIN: Prealbumin: 31.4 mg/dL (ref 17.0–34.0)

## 2014-04-26 MED ORDER — CARBAMAZEPINE 100 MG/5ML PO SUSP
300.0000 mg | Freq: Three times a day (TID) | ORAL | Status: DC
  Administered 2014-04-26 – 2014-05-10 (×42): 300 mg via ORAL
  Filled 2014-04-26 (×50): qty 15

## 2014-04-26 NOTE — Progress Notes (Signed)
Speech Language Pathology Daily Session Note  Patient Details  Name: [name] MRN: [id number] Date of Birth: [birth year]  Today's Date: 04/26/2014 Time: 0930-1010 Time Calculation (min): 40 min  Short Term Goals: Week 2: SLP Short Term Goal 1 (Week 2): Patient will sustain attention for 15 seconds with Max multi-modal cues SLP Short Term Goal 2 (Week 2): Patient will initiate self-feeding with Max multi-modal cues  SLP Short Term Goal 3 (Week 2): Patient will follow 1-step commands in a structured, familiar task with Max assist  SLP Short Term Goal 4 (Week 2): Pt will increase speech intelligiblity at the word level to 50% with Max A multimodal cues.  SLP Short Term Goal 5 (Week 2): Pt will consume ice chip trials without overt s/s of aspiration with Mod A verbal cues.   Skilled Therapeutic Interventions: Skilled treatment session focused on speech and cognitive goals and family education with pt's aunt. SLP facilitated session by providing verbal and demonstration cues for appropriate verbal communication with patient in regards to getting pt's attention and asking short, simple commands or questions as well as allowing appropriate time for a verbal response. Pt's aunt verbalized understanding. Pt lethargic throughout the session and required Max verbal and tactile cues for sustained attention for 15 seconds. Pt also required Max multimodal cues to increase speech intelligibility at the word level to 75% accuracy. Pt left reclined in wheelchair with quick release in place and aunt present. Continue with current plan of care.    FIM:  Comprehension Comprehension Mode: Auditory Comprehension: 1-Understands basic less than 25% of the time/requires cueing 75% of the time Expression Expression Mode: Verbal Expression: 1-Expresses basis less than 25% of the time/requires cueing greater than 75% of the time. Social Interaction Social Interaction: 1-Interacts appropriately less than 25% of  the time. May be withdrawn or combative. Problem Solving Problem Solving: 1-Solves basic less than 25% of the time - needs direction nearly all the time or does not effectively solve problems and may need a restraint for safety Memory Memory: 1-Recognizes or recalls less than 25% of the time/requires cueing greater than 75% of the time  Pain Pain Assessment Pain Assessment: Faces Faces Pain Scale: Hurts a little bit  Therapy/Group: Individual Therapy  Buzzy Han 04/26/2014, 4:13 PM

## 2014-04-26 NOTE — Progress Notes (Signed)
NUTRITION FOLLOW UP  Intervention:   1. Enteral nutrition; Continue Jevity 1.5 @ 75 ml/hr x 20 hours daily. (Anticipate TF to be held 4 hours daily for participation in therapy.) 2. Free water; flush tube with 30 mL q hrs x 20 hrs daily. Consider increasing to 40 mL/flush if needed to maintain hydration and electrolyte balance.  Labs currently WNL.   NUTRITION DIAGNOSIS:  Inadequate oral intake related to inability to eat, dysphagia as evidenced by TF dependent.   Monitor:  1. Enteral nutrition; initiation with tolerance. Pt to meet >/=90% estimated needs with nutrition support.  2. Wt/wt change; monitor trends  Assessment:   Pt admitted with deconditioning s/p TBI. She has trach/PEG. Her home regimen is Jevity 1.5 @ 60 mL/hr continuous with 25 mL free water flushes q hr.   Pt continues with good tolerance of TF regimen. She is currently receiving Jevity 1.5 @ 75 mL/hr x 20 hrs daily with 30 mL water flushes q 1 hr which provides 2250 kcal, 95g protein, and 1740 mL free water. Family at bedside confirms. Pt able to  Continue PO trials with ice chips. RD will continue to follow for progress with therapy.  Electrolytes stable.   RD is unsure about pt's potential for bolus feeds at this time.  Spends some time in wheelchair as well as bed.  Spasticity with head turning alters position in bed. Would continue continuous feeds for now. May ultimately become a matter of family preference.   Height: Ht Readings from Last 1 Encounters:  04/15/14 5\' 5"  (1.651 m)    Weight Status:   Wt Readings from Last 1 Encounters:  04/15/14 125 lb (56.7 kg)    Re-estimated needs:  Kcal: 2000-2200  Protein: 90-105g  Fluid: ~2.0 L/d  Skin: intact  Diet Order: NPO   Intake/Output Summary (Last 24 hours) at 04/26/14 1153 Last data filed at 04/26/14 0700  Gross per 24 hour  Intake   1175 ml  Output      0 ml  Net   1175 ml    Last BM: 5/19   Labs:  No results found for this basename: NA, K,  CL, CO2, BUN, CREATININE, CALCIUM, MG, PHOS, GLUCOSE,  in the last 168 hours  CBG (last 3)  No results found for this basename: GLUCAP,  in the last 72 hours  Scheduled Meds: . baclofen  20 mg Per Tube QID  . carBAMazepine  300 mg Oral 3 times per day  . ferrous sulfate  300 mg Oral BID WC  . free water  150 mL Per Tube TID PC & HS  . HYDROcodone-acetaminophen  1 tablet Per Tube BID  . pantoprazole sodium  40 mg Per Tube Daily  . propranolol  20 mg Per Tube TID  . QUEtiapine  150 mg Oral QHS  . QUEtiapine  50 mg Oral BID  . traZODone  50 mg Oral QHS    Continuous Infusions: . feeding supplement (JEVITY 1.5 CAL/FIBER) 1,000 mL (04/25/14 2202)    Brynda Greathouse, MS RD LDN Clinical Inpatient Dietitian Pager: [phone] Weekend/After hours pager: [phone]
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
## 2014-04-26 NOTE — Progress Notes (Signed)
Orthopedic Tech Progress Note Patient Details:  [name] 10/04/1981 [id number]  Patient ID: [name], female   DOB: [birth year], 33 y.o.   MRN: [id number] Called in [place] order; spoke with Charlaine Dalton 04/26/2014, 4:31 PM

## 2014-04-26 NOTE — Progress Notes (Signed)
Occupational Therapy Session Note  Patient Details  Name: PRAJNA VANDERPOOL MRN: [id number] Date of Birth: [birth year]  Today's Date: 04/26/2014 Time: 7322-0254 and 1140-1200 (co-tx with PT (BR) [id number]) Time Calculation (min): 60 min and 20 min   Short Term Goals: Week 2:     Skilled Therapeutic Interventions/Progress Updates:    Session 1: Pt seen for ADL retraining with focus on family education, bed mobility, attention, following one step commands, and postural control in sitting. Pt received supine in bed with mother and aunt present. Allowed mom to lead ADL session. Therapist educating on providing prolonged stretching to RLE prior to activity to facilitate bed mobility. Discussed facilitating cervical flexion and rotation during bed mobility as well. Pt tolerating lying on both sides for apporx 15 sec during hygiene with less verbalizations. Pt following one-step commands approx 20% of session. Encouraged mother to allow pt to assist with washing face and initiating task by placing wash cloth on forehead. Completed supine>sit and engaged in UB dressing and hygiene to LUE with BUE propped on table. This positioning allowed for ability to complete hygiene more thoroughly and facilitate L shoulder ROM at a more comfortably. Pt's mother reporting she would feel more comfortable with this technique when having 2 people. Discussed using this technique ~3 times per week and family agreeable. Pt's aunt also engaged in education with self-care from mother and therapist. Educated on passive stretching to cervical region while supine in bed and mother returning demonstration. At end of session, transferred pt to w/c via mechanical lift and left with SLP and mother present.   Session 2: Pt seen for therapeutic co-treatment with PT (BR) with focus on education/family training on use of manual Hoyer lift and strategies for safe transfers with patient at home. Pt received sitting in wheelchair, asleep. Pt's  aunt, [name], present for family education/training session. Education/discussion on parts/functions of manual [employer]. Pt transferred wheelchair>bed via MaxiSky to start session. Rolling to B sides with +2 for removal of MaxiSky sling and placement of manual Hoyer lift sling. Demonstration of transfer bed>wheelchair performed by therapist first for Tanya to observe. Tanya then performed wheelchair<>bed transfer with use of manual [employer] with mod cues initially for proper use of equipment, progressing to min cues during second trial. Tanya verbalized understanding of all techniques and asked appropriate questions throughout. Will benefit from further training. Pt left sitting in w/c with aunt present and QRB donned.   Therapy Documentation Precautions:  Precautions Precautions: Fall Precaution Comments: severe contractures B ankles, L UE; high tone R LE Restrictions Weight Bearing Restrictions: No General:   Vital Signs: Therapy Vitals Temp: 98.6 F (37 C) Temp src: Axillary Pulse Rate: 89 Resp: 19 BP: 143/92 mmHg Patient Position (if appropriate): Lying Oxygen Therapy SpO2: 97 % O2 Device: None (Room air) Pain:  See FIM for current functional status  Therapy/Group: Individual Therapy and Co-Treatment  Jaidyn Usery N Kelvyn Schunk 04/26/2014, 9:56 AM

## 2014-04-26 NOTE — Progress Notes (Signed)
Physical Therapy Session Note  Patient Details  Name: [name] MRN: [id number] Date of Birth: 02-Jan-1981  Today's Date: 04/26/2014 Time: 1115 ([place] with OT (KNP) 1115-1200)-1140 and 1300-1420 Time Calculation (min): 25 min and 80 min  Short Term Goals: Week 2:  PT Short Term Goal 1 (Week 2): STGs=LTGs secondary to patient's CLOF and anticipated discharge at totalA/dependent level of function.  Skilled Therapeutic Interventions/Progress Updates:    AM Session: Patient received sitting in wheelchair, asleep. Patient's aunt, [name], present for family education/training session. Session focused on education/family training on use of manual Hoyer lift and strategies for safe transfers with patient at home. Education/discussion on parts/functions of manual [employer]. Patient transferred wheelchair>bed via MaxiSky to start session. Rolling to B sides with +2 for removal of MaxiSky sling and placement of manual Hoyer lift sling. Demonstration of transfer bed>wheelchair performed by therapist first for Tanya to observe. Tanya then performed wheelchair<>bed transfer with use of manual [employer] with mod cues initially for proper use of equipment, progressing to min cues during second trial. Tanya verbalized understanding of all techniques and asked appropriate questions throughout. Will benefit from further training. Patient left sitting in wheelchair for next therapy session at 1 with ATP.  PM Session: Patient received sitting in wheelchair with aunt, [name], present. Session focused on specialty wheelchair evaluation with ATP, see shadow chart for details on ATP evaluation. Patient left sitting in wheelchair, RN aware of need to return to bed.  Therapy Documentation Precautions:  Precautions Precautions: Fall Precaution Comments: severe contractures B ankles, L UE; high tone R LE Restrictions Weight Bearing Restrictions: No Pain: Pain Assessment Pain Assessment: Faces Faces Pain Scale:  Hurts little more Locomotion : Ambulation Ambulation/Gait Assistance: Not tested (comment)   See FIM for current functional status  Therapy/Group: Co-Treatment with OT in AM, Individual session in PM (with ATP)  Shelton Silvas S Lee Kalt S. Sparrow Sanzo, PT, DPT 04/26/2014, 12:11 PM

## 2014-04-27 ENCOUNTER — Encounter (HOSPITAL_COMMUNITY): Payer: Self-pay

## 2014-04-27 ENCOUNTER — Inpatient Hospital Stay (HOSPITAL_COMMUNITY): Payer: No Typology Code available for payment source | Admitting: Speech Pathology

## 2014-04-27 ENCOUNTER — Inpatient Hospital Stay (HOSPITAL_COMMUNITY): Payer: No Typology Code available for payment source | Admitting: *Deleted

## 2014-04-27 DIAGNOSIS — S069X9A Unspecified intracranial injury with loss of consciousness of unspecified duration, initial encounter: Secondary | ICD-10-CM

## 2014-04-27 DIAGNOSIS — G8250 Quadriplegia, unspecified: Secondary | ICD-10-CM

## 2014-04-27 DIAGNOSIS — R1310 Dysphagia, unspecified: Secondary | ICD-10-CM

## 2014-04-27 DIAGNOSIS — S069XAA Unspecified intracranial injury with loss of consciousness status unknown, initial encounter: Secondary | ICD-10-CM

## 2014-04-27 DIAGNOSIS — S42309B Unspecified fracture of shaft of humerus, unspecified arm, initial encounter for open fracture: Secondary | ICD-10-CM

## 2014-04-27 LAB — URINE CULTURE: Colony Count: 5000

## 2014-04-27 NOTE — Progress Notes (Signed)
Physical Therapy Session Note  Patient Details  Name: [name] MRN: [id number] Date of Birth: October 05, 1981  Today's Date: 04/27/2014 Time: 0800-0830 and 1400-1435 Time Calculation (min): 30 min and 35 min  Short Term Goals: Week 2:  PT Short Term Goal 1 (Week 2): STGs=LTGs secondary to patient's CLOF and anticipated discharge at totalA/dependent level of function.  Skilled Therapeutic Interventions/Progress Updates:    AM Session: Patient received semi-reclined in bed. Session focused on rolling to perform hygiene after incontinence and initiation, command following, and sustained attention during transfer bed>wheelchair via MaxiSky lift. +2 for rolling to doff brief, perform hygiene, and don new brief. TotalA for lower body dressing. Patient left sitting in wheelchair at RN station with seatbelt donned, awaiting next therapy; RN aware.  PM Session: Patient received semi-reclined in bed, father present. Patient asleep and very difficult to arouse despite multi-modal cues. Patient able to open eyes for 3-5 seconds, but then falls asleep again. Discussion/education with patient's father about CLOF, ELOS, prognosis, discharge planning, various venues of care, etc. Patient's father adamantly disagrees about patient discharging to her mother's house. Social worker aware and plans to discuss with patient's father today. Patient left semi-reclined in bed with father present.   Therapy Documentation Precautions:  Precautions Precautions: Fall Precaution Comments: severe contractures B ankles, L UE; high tone R LE Restrictions Weight Bearing Restrictions: No General: Amount of Missed PT Time (min): 25 Minutes Missed Time Reason: Patient fatigue Pain: Pain Assessment Pain Assessment: Faces Faces Pain Scale: No hurt Locomotion : Ambulation Ambulation/Gait Assistance: Not tested (comment)   See FIM for current functional status  Therapy/Group: Individual Therapy  [name]. Bernadette Gores, PT, DPT 04/27/2014, 2:47 PM
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
## 2014-04-27 NOTE — Progress Notes (Signed)
Speech Language Pathology Daily Session Note  Patient Details  Name: [name] MRN: [id number] Date of Birth: Aug 20, 1981  Today's Date: 04/27/2014 Time: 8144-8185 Time Calculation (min): 15 min  Short Term Goals: Week 2: SLP Short Term Goal 1 (Week 2): Patient will sustain attention for 15 seconds with Max multi-modal cues SLP Short Term Goal 2 (Week 2): Patient will initiate self-feeding with Max multi-modal cues  SLP Short Term Goal 3 (Week 2): Patient will follow 1-step commands in a structured, familiar task with Max assist  SLP Short Term Goal 4 (Week 2): Pt will increase speech intelligiblity at the word level to 50% with Max A multimodal cues.  SLP Short Term Goal 5 (Week 2): Pt will consume ice chip trials without overt s/s of aspiration with Mod A verbal cues.   Skilled Therapeutic Interventions: Skilled treatment session focused on cognitive goals. Upon arrival, pt asleep in the wheelchair. Pt required Max verbal and tactile cues for arousal and required total A to keep eyes open and stay awake for ~10 seconds. This clinician provided max encouragement and multiple attempts, however, unsuccessful.  RN made aware. Pt missed remaining 30 minutes of session due to fatigue. Continue with current plan of care.    FIM:  Comprehension Comprehension Mode: Auditory Comprehension: 1-Understands basic less than 25% of the time/requires cueing 75% of the time Expression Expression Mode: Verbal Expression: 1-Expresses basis less than 25% of the time/requires cueing greater than 75% of the time. Social Interaction Social Interaction: 1-Interacts appropriately less than 25% of the time. May be withdrawn or combative. Problem Solving Problem Solving: 1-Solves basic less than 25% of the time - needs direction nearly all the time or does not effectively solve problems and may need a restraint for safety Memory Memory: 1-Recognizes or recalls less than 25% of the time/requires cueing  greater than 75% of the time  Pain Pain Assessment Pain Assessment: Faces Faces Pain Scale: No hurt Pain Intervention(s): Medication (See eMAR) (Vicodin 1 tab)  Therapy/Group: Individual Therapy  Buzzy Han 04/27/2014, 9:04 AM

## 2014-04-27 NOTE — Progress Notes (Signed)
Occupational Therapy Session Note  Patient Details  Name: [name] MRN: [id number] Date of Birth: [birth year]  Today's Date: 04/27/2014 Time: 1000-1051 Time Calculation (min): 51 min  Short Term Goals: Week 2:     Skilled Therapeutic Interventions/Progress Updates:    Pt seen for 1:1 OT session with focus on cognitive skills, attention to task, initiation, and bed mobility. Pt received in w/c. Pt with increased verbalizations at beginning of therapy session then began fatiguing by end of session, falling asleep multiple times. Pt required max cues for attention to task. Completed grooming tasks of washing face, combing hair, and oral care. Pt initiated oral care when presented brush. Pt required assistance with other grooming tasks and did assist with combing hair approx 10% of time. Engaged in cognitive task of identifying colors and objects using written cues. Pt demonstrating sustained attention to task approx 5-8 seconds. Pt demonstrated ability to read written words approx 75% of time with increased time. Pt falling asleep multiple times turning task. Transferred to bed via MaxiSky with +2 assist for safety. Completed rolling in bed with max-total assist for removing lift pad. Emphasis on cervical flexion and rotation to facilitate rolling. Pt left supine in bed with HOB elevated and all needs in reach.   Therapy Documentation Precautions:  Precautions Precautions: Fall Precaution Comments: severe contractures B ankles, L UE; high tone R LE Restrictions Weight Bearing Restrictions: No General: General Amount of Missed OT Time (min): 9 Minutes Vital Signs:   Pain: Pain Assessment Pain Assessment: Faces Faces Pain Scale: No hurt  See FIM for current functional status  Therapy/Group: Individual Therapy  [name] 04/27/2014, 10:54 AM
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
## 2014-04-28 ENCOUNTER — Encounter (HOSPITAL_COMMUNITY): Payer: Self-pay

## 2014-04-28 ENCOUNTER — Inpatient Hospital Stay (HOSPITAL_COMMUNITY): Payer: No Typology Code available for payment source | Admitting: *Deleted

## 2014-04-28 ENCOUNTER — Inpatient Hospital Stay (HOSPITAL_COMMUNITY): Payer: No Typology Code available for payment source | Admitting: Speech Pathology

## 2014-04-28 ENCOUNTER — Inpatient Hospital Stay (HOSPITAL_COMMUNITY): Payer: Self-pay

## 2014-04-28 MED ORDER — ONABOTULINUMTOXINA 100 UNITS IJ SOLR
300.0000 [IU] | Freq: Once | INTRAMUSCULAR | Status: DC
  Filled 2014-04-28: qty 300

## 2014-04-28 NOTE — Progress Notes (Signed)
Notified Marlowe Shores, PA of swelling in right foot.  No new orders written at this time.  Will continue to monitor patient closely.  Brita Romp, RN

## 2014-04-28 NOTE — Progress Notes (Signed)
Speech Language Pathology Daily Session Note  Patient Details  Name: [name] MRN: [id number] Date of Birth: 08-27-1981  Today's Date: 04/28/2014 Time: 1400-1443 Time Calculation (min): 43 min  Short Term Goals: Week 2: SLP Short Term Goal 1 (Week 2): Patient will sustain attention for 15 seconds with Max multi-modal cues SLP Short Term Goal 2 (Week 2): Patient will initiate self-feeding with Max multi-modal cues  SLP Short Term Goal 3 (Week 2): Patient will follow 1-step commands in a structured, familiar task with Max assist  SLP Short Term Goal 4 (Week 2): Pt will increase speech intelligiblity at the word level to 50% with Max A multimodal cues.  SLP Short Term Goal 5 (Week 2): Pt will consume ice chip trials without overt s/s of aspiration with Mod A verbal cues.   Skilled Therapeutic Interventions: Pt extremely lethargic throughout the session and was unable to arouse for more than 30 seconds. Pt's mother present and reported she has been asleep "most of the day." RN made aware. Majority of the session was spent providing education to the pt's mother in regards to pt's current swallowing function and goals of skilled SLP intervention. Also educated pt's mother in regards pt's current cognitive-linguistic and speech function and strategies to utilize to increase auditory comprehension, verbal expression and speech intelligibility. Pt's mother verbalized understanding of all information and asked appropriate questions. Continue with current plan of care.    FIM:  Comprehension Comprehension Mode: Auditory Comprehension: 1-Understands basic less than 25% of the time/requires cueing 75% of the time Expression Expression Mode: Verbal Expression: 1-Expresses basis less than 25% of the time/requires cueing greater than 75% of the time. Social Interaction Social Interaction: 1-Interacts appropriately less than 25% of the time. May be withdrawn or combative. Problem Solving Problem  Solving: 1-Solves basic less than 25% of the time - needs direction nearly all the time or does not effectively solve problems and may need a restraint for safety Memory Memory: 1-Recognizes or recalls less than 25% of the time/requires cueing greater than 75% of the time  Pain No s/s of pain  Therapy/Group: Individual Therapy  Buzzy Han 04/28/2014, 3:26 PM
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
## 2014-04-28 NOTE — Progress Notes (Signed)
Occupational Therapy Session Note  Patient Details  Name: [name] MRN: [id number] Date of Birth: 1981-01-14  Today's Date: 04/28/2014 Time: 3875-6433 and 2951-8841 Time Calculation (min): 45 min and 48 min   Short Term Goals: Week 2:     Skilled Therapeutic Interventions/Progress Updates:    Session 1: Pt seen for 1:1 OT session with focus on bed mobility, initiation, following simple commands, and sustained attention. Pt received supine in bed with father present. Father reporting he will not be assisting with ADLs so did not participate in family training. Completed bathing and dressing supine in bed with pt requiring total assist for bed mobility secondary to increased lethargy. Pt with eyes open during movements only. Pt transferred to w/c via [employer] and left reclined back in w/c. Pt asleep upon exiting room. Therapist encouraged father to keep all lights off to allow for optimal rest.   Session 2: Pt seen for 1:1 OT session with focus on postural control, LUE ROM, static sitting balance, following simple commands, and initiation. Pt received in w/c with increased verbalizations and no intelligbility. Mother present and eager to participate in family education/training for sitting EOB with pt. Pt transferred to EOB via [employer]. Mother demonstrated carryover of previous session for optimal positioning behind pt. Therapist educated on use of rocking motion for hip/trunk control and for increased comfort to pt. Positioned table in front of pt with BUE resting on table. Provided rocking motion for increased ROM at L shoulder. Engaged in counting task and tossing ball game with 0% participation in counting and ~10% participation in ball toss. Pt demonstrating poor head control requiring max-min assist for cervical extension. Pt very lethargic, closing eyes multiple times throughout session. Assisted pt sit>supine with total assist +1. Pt left asleep in bed with mother present.  Therapy  Documentation Precautions:  Precautions Precautions: Fall Precaution Comments: severe contractures B ankles, L UE; high tone R LE Restrictions Weight Bearing Restrictions: No General:   Vital Signs: Therapy Vitals Temp: 98.2 F (36.8 C) Temp src: Axillary Pulse Rate: 98 Resp: 20 BP: 115/78 mmHg Patient Position (if appropriate): Sitting Oxygen Therapy SpO2: 100 % O2 Device: None (Room air) Pain:   See FIM for current functional status  Therapy/Group: Individual Therapy  Quillian Quince Shanyce Daris 04/28/2014, 4:29 PM

## 2014-04-28 NOTE — Progress Notes (Signed)
Physical Therapy Session Note  Patient Details  Name: [name] MRN: [id number] Date of Birth: December 22, 1980  Today's Date: 04/28/2014 Time: 5170-0174 Time Calculation (min): 55 min  Short Term Goals: Week 2:  PT Short Term Goal 1 (Week 2): STGs=LTGs secondary to patient's CLOF and anticipated discharge at totalA/dependent level of function.  Skilled Therapeutic Interventions/Progress Updates:    Patient received sitting in wheelchiar, asleep; Patient's father present. Patient with increased lethargy throughout session, with eyes open only during movement. Session focused on postural control and static sitting balance on EOM, edema management of R foot, and initiation, sustained attention, and command following. Patient requires totalA sitting EOM, 0% accuracy to identify rings of certain colors. R ankle/foot swelling noted, likely due to R knee extension brace. Maintained brace off and educated staff not to don until swelling subsides. Additionally, educated staff on schedule for brace. Patient left sitting in wheelchair with seatbelt donned and father present.  Therapy Documentation Precautions:  Precautions Precautions: Fall Precaution Comments: severe contractures B ankles, L UE; high tone R LE Restrictions Weight Bearing Restrictions: No Pain: Pain Assessment Pain Assessment: Faces Pain Score: Asleep Faces Pain Scale: No hurt Locomotion : Ambulation Ambulation/Gait Assistance: Not tested (comment)   See FIM for current functional status  Therapy/Group: Individual Therapy  [name]. Oakleigh Hesketh, PT, DPT 04/28/2014, 11:20 AM

## 2014-04-28 NOTE — Progress Notes (Signed)
Social Work Patient ID: Dierdre Harness, female   DOB: 1981/07/13, 33 y.o.   MRN: [id number]   Have reviewed team conference with pt's mother today and discussed plan for family conference next Wed. 5/27 @ 1:00.  Mother agreeable and aware we have targeted d/c for the following week with plan to begin education as well.  Explained to mother that pt's father has expressed that he is not in agreement with plan for pt to d/c home with mother. Mother states that she has told him and does not understand why he is not in agreement.  Have explained that we need her to follow up with him and they need to be in agreement with the d/c plan - she will follow up with him and then with me.  Have left message for father about family conference next week as well.  Continue to follow.  Lennart Pall, LCSW

## 2014-04-29 ENCOUNTER — Inpatient Hospital Stay (HOSPITAL_COMMUNITY): Payer: No Typology Code available for payment source | Admitting: *Deleted

## 2014-04-29 ENCOUNTER — Encounter (HOSPITAL_COMMUNITY): Payer: Self-pay

## 2014-04-29 ENCOUNTER — Inpatient Hospital Stay (HOSPITAL_COMMUNITY): Payer: Self-pay

## 2014-04-29 MED ORDER — DANTROLENE SODIUM 25 MG PO CAPS
25.0000 mg | ORAL_CAPSULE | Freq: Two times a day (BID) | ORAL | Status: DC
  Administered 2014-04-29 – 2014-05-03 (×9): 25 mg via ORAL
  Filled 2014-04-29 (×12): qty 1

## 2014-04-29 NOTE — Progress Notes (Signed)
Physical Therapy Session Note  Patient Details  Name: [name] MRN: [id number] Date of Birth: [birth year]  Today's Date: 04/29/2014 Time:1030-1115 and 1330 ([place] with SLP (CP) [phone] Time Calculation (min): 45 min and 25 min  Short Term Goals: Week 2:  PT Short Term Goal 1 (Week 2): STGs=LTGs secondary to patient's CLOF and anticipated discharge at totalA/dependent level of function.  Skilled Therapeutic Interventions/Progress Updates:    AM Session: Patient received supine in bed, asleep; mother, [name], present. Session focused on education/family training on use of manual Hoyer lift and strategies for safe transfers with patient at home. Education/discussion on parts/functions of manual [employer]. Rolling to B sides with totalA of one for placement of manual Hoyer lift sling. [name] performed bed>wheelchair transfer with use of manual [employer] with mod cues initially for proper use of equipment, progressing to min cues. [name] verbalized understanding of all techniques and asked appropriate questions throughout. Will benefit from further training. Patient left sitting in wheelchair and handed off to OT for further family education session.  PM Session: Patient received supine in bed with her mother performing hygiene after incontinence. Co-treatment with SLP this session with emphasis on postural control sitting EOB during PO trials of ice chips and water. Patient requires +2 for supine<>sit and totalA for sitting EOB. Emphasis of postural control on midline orientation, upright posture, L trunk lengthening/R trunk shortening, and anterior pelvic tilt. In supine, manual stretching/tone management of R LE into neutral hip and knee. Patient's R LE much easier to stretch closer to neutral than previously. Patient left supine in bed with 3 rails up, bed alarm on, and all needs within reach.  Therapy Documentation Precautions:  Precautions Precautions: Fall Precaution Comments:  severe contractures B ankles, L UE; high tone R LE Restrictions Weight Bearing Restrictions: No Pain: Pain Assessment Pain Assessment: Faces Faces Pain Scale: Hurts little more Locomotion : Ambulation Ambulation/Gait Assistance: Not tested (comment)   See FIM for current functional status  Therapy/Group: Individual Therapy in AM and Co-Treatment in PM  [employer]. Giovanni Biby, PT, DPT 04/29/2014, 1:54 PM
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
## 2014-04-29 NOTE — Progress Notes (Signed)
Occupational Therapy Session Note  Patient Details  Name: [name] MRN: [id number] Date of Birth: 09-30-1981  Today's Date: 04/29/2014 Time: 0093-8182 and 1115-1200 Time Calculation (min): 45 min and 45 min   Short Term Goals: Week 2:     Skilled Therapeutic Interventions/Progress Updates:    Session 1: Pt seen for ADL session with focus on bed mobility and family education/training. Pt received supine in bed with mother present. Mother provided total assist for bathing and dressing at bed level. Mother demonstrated carryover of encouraging pt to complete grooming task of washing face using multimodal cues. Therapist provided prolonged stretch to RLE during UB self-care. Educated and practiced supine<>sit. Therapist assisted with supine>sit educating on HOB elevation, body mechanics, and hand placement. Used counting to 3 with pt to prepare for transitional movements. Mother provided support for sitting EOB with emphasis on trunk mobility during rocking motion. Pt repeated 3 word sentence with head turn towards person she was speaking too. Educated mother on assist sit>supine and she was able to return demonstration with min difficulty. She reported feeling much more comfortable with this technique and would benefit from further training. Pt left supine in bed with all needs in reach.   Session 2: Pt seen for 1:1 OT session with focus family training/education, manual hoyer lift transfer, prolonged stretch to LUE, sustained attention, and memory. Pt received sitting in w/c following PT session. Continued education from PT session on use of manual hoyer lift. Completed transfers w/c<>bed with +2 assist (for safety) as mother providing most of assistance. Discussed discharge plans and will continue training mother as she plans on being primary caregiver and completing most care alone. Mother returned demonstration for positioning in w/c with min cues. Educated and practiced prolonged stretch to  LUE. Utilized familiar song to sing during task with pt demonstrating improved memory and sustained attention (~10-15 seconds). Pt would verbalize 2-3 words as she sang with mother and therapist. Pt with improved tolerance of prolonged stretch during this activity. At end of session, pt left in w/c with mother present. No questions or concerns at this time.   Therapy Documentation Precautions:  Precautions Precautions: Fall Precaution Comments: severe contractures B ankles, L UE; high tone R LE Restrictions Weight Bearing Restrictions: No General:   Vital Signs: Therapy Vitals Pulse Rate: 68 BP: 135/78 mmHg Pain: Pt demonstrates pain via facial grimaces during movement.   Other Treatments:    See FIM for current functional status  Therapy/Group: Individual Therapy  Sheron Tallman N Ermie Glendenning 04/29/2014, 11:15 AM

## 2014-04-29 NOTE — Progress Notes (Signed)
Speech Language Pathology Daily Session Note  Patient Details  Name: [name] MRN: [id number] Date of Birth: 04-18-1981  Today's Date: 04/29/2014 Time: 1300-1330 Time Calculation (min): 30 min  Short Term Goals: Week 2: SLP Short Term Goal 1 (Week 2): Patient will sustain attention for 15 seconds with Max multi-modal cues SLP Short Term Goal 2 (Week 2): Patient will initiate self-feeding with Max multi-modal cues  SLP Short Term Goal 3 (Week 2): Patient will follow 1-step commands in a structured, familiar task with Max assist  SLP Short Term Goal 4 (Week 2): Pt will increase speech intelligiblity at the word level to 50% with Max A multimodal cues.  SLP Short Term Goal 5 (Week 2): Pt will consume ice chip trials without overt s/s of aspiration with Mod A verbal cues.   Skilled Therapeutic Interventions: Patient received supine in bed with her mother performing hygiene after incontinence. Co-treatment with PT this session with emphasis on postural control sitting EOB during PO trials of ice chips and thin liquid via tsp. Patient required total A to maintain midline orientation and upright posture of her head and neck. Pt with timely swallow initiation with 50% of ice chip trials and required Max verbal cues for remaining of trials which resulted in anterior spillage but no overt s/s of aspiration. Pt with limited trials of thin via tsp due to increased time for swallow initiation and frequent "stares" during PO trials. Pt with increased verbal expression and speech intelligibility at the word level throughout the session. Pt left supine in bed with PT. Continue with current plan of care.    FIM:  Comprehension Comprehension: 1-Understands basic less than 25% of the time/requires cueing 75% of the time Expression Expression Mode: Verbal Expression: 2-Expresses basic 25 - 49% of the time/requires cueing 50 - 75% of the time. Uses single words/gestures. Social Interaction Social  Interaction: 1-Interacts appropriately less than 25% of the time. May be withdrawn or combative. Problem Solving Problem Solving: 1-Solves basic less than 25% of the time - needs direction nearly all the time or does not effectively solve problems and may need a restraint for safety Memory Memory: 1-Recognizes or recalls less than 25% of the time/requires cueing greater than 75% of the time  Pain Pt with discomfort during repositioning, pt premedicated.   Therapy/Group: Individual Therapy  Buzzy Han 04/29/2014, 4:19 PM

## 2014-04-30 ENCOUNTER — Inpatient Hospital Stay (HOSPITAL_COMMUNITY): Payer: Self-pay

## 2014-04-30 DIAGNOSIS — Z86718 Personal history of other venous thrombosis and embolism: Secondary | ICD-10-CM
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
## 2014-04-30 NOTE — Progress Notes (Signed)
Right upper and bilateral lower extremity venous duplex completed.  Right upper extremity venous:  No evidence of DVT or superficial thrombosis.  Bilateral lower extremity venous:  No evidence of DVT, superficial thrombosis, or Baker's Cyst.

## 2014-04-30 NOTE — Progress Notes (Signed)
Occupational Therapy Session Note  Patient Details  Name: KERLY RIGSBEE MRN: [id number] Date of Birth: 01-30-1981  Today's Date: 04/30/2014 Time: 3220-2542 Time Calculation (min): 47 min  Short Term Goals: Week 2:     Skilled Therapeutic Interventions/Progress Updates:    Pt seen for 1:1 OT session with focus on bed mobility, initiation, sustained attention, PROM, and family training/education. Pt received supine in bed with mother present, beginning ADLs. Mother completed bathing and dressing with total assist while supine in bed. Pt following simple command during bed mobility ~10% of time. Discussed using counting to prepare pt for rolling and transitional movements. Pt counting with therapist and mom ~25% of time. Engaged in singing of familiar task with pt recalling approx 20% of song. Therapist provided manual stretching/tone management to LUE and RLE into neutral hip and knee positioning. Pt much easier to stretch closer to neutral than previous sessions and tolerating prolonged stretch much better. Pt keeping RLE in more neutral position for increased time, decreasing burden with LB dressing. At end of session pt left supine in bed with all needs in reach and mother present.    Therapy Documentation Precautions:  Precautions Precautions: Fall Precaution Comments: severe contractures B ankles, L UE; high tone R LE Restrictions Weight Bearing Restrictions: No General:   Vital Signs:   Pain: Pt demonstrates facial expressions for pain.  See FIM for current functional status  Therapy/Group: Individual Therapy  Zyree Traynham N Alyzza Andringa 04/30/2014, 12:56 PM

## 2014-05-01 ENCOUNTER — Inpatient Hospital Stay (HOSPITAL_COMMUNITY): Payer: No Typology Code available for payment source | Admitting: *Deleted

## 2014-05-01 NOTE — Progress Notes (Signed)
Physical Therapy Session Note  Patient Details  Name: [name] MRN: [id number] Date of Birth: 07-Jul-1981  Today's Date: 05/01/2014 Time: 1002-1047 Time Calculation (min): 45 min   Skilled Therapeutic Interventions/Progress Updates:  Patient in bed , father present in the room. Manual stretching to B LE to prevent further contractures and reduce existing ones. Sitting EOb with max A ,patient able to raise her head but not able to hold it up for longer than 3 s.  Family education on PROM exercises and safety during transfers. Father was able to assist in Secor lift transfer to the w/c.  Patient positioned in w/c ,presenting with strong R side lean, pillow uses to prop up.  Patient's father has expressed he wished [facility] could stay longer to make more benefits and that he feels like prior SNF placing  Has not done enough for his daughter.    Therapy Documentation Precautions:  Precautions Precautions: Fall Precaution Comments: severe contractures B ankles, L UE; high tone R LE Restrictions Weight Bearing Restrictions: No   See FIM for current functional status  Therapy/Group: Individual Therapy  [name] Spanish 05/01/2014, 12:41 PM
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
## 2014-05-01 NOTE — Progress Notes (Signed)
New bottle of tube feeding hung at 1800, father continued to request rate be set at 55mL/hr rather than 59mL/hr as ordered. Hortencia Conradi RN

## 2014-05-01 NOTE — Progress Notes (Signed)
Restless night, yelling/moaning most of night. Ultram given at 0311. Residuals checked at 2200= 81ml and at 0300=68ml. Dad requested TF to be turned down from 39ml/hr to 49ml/hr. Incontinent of B&B. Cornell Barman

## 2014-05-02 ENCOUNTER — Inpatient Hospital Stay (HOSPITAL_COMMUNITY): Payer: No Typology Code available for payment source | Admitting: *Deleted

## 2014-05-02 ENCOUNTER — Inpatient Hospital Stay (HOSPITAL_COMMUNITY): Payer: No Typology Code available for payment source

## 2014-05-02 ENCOUNTER — Inpatient Hospital Stay (HOSPITAL_COMMUNITY): Payer: Self-pay | Admitting: *Deleted

## 2014-05-02 ENCOUNTER — Encounter (HOSPITAL_COMMUNITY): Payer: Self-pay

## 2014-05-02 LAB — URINALYSIS, ROUTINE W REFLEX MICROSCOPIC
BILIRUBIN URINE: NEGATIVE
GLUCOSE, UA: NEGATIVE mg/dL
HGB URINE DIPSTICK: NEGATIVE
KETONES UR: NEGATIVE mg/dL
Nitrite: NEGATIVE
PH: 8 (ref 5.0–8.0)
PROTEIN: NEGATIVE mg/dL
Specific Gravity, Urine: 1.018 (ref 1.005–1.030)
Urobilinogen, UA: 0.2 mg/dL (ref 0.0–1.0)

## 2014-05-02 LAB — URINE MICROSCOPIC-ADD ON

## 2014-05-02 LAB — URINE CULTURE
Colony Count: NO GROWTH
Culture: NO GROWTH

## 2014-05-02 NOTE — Plan of Care (Signed)
Problem: RH Balance Goal: LTG Patient will maintain dynamic sitting balance (PT) LTG: Patient will maintain dynamic sitting balance with assistance during mobility activities (PT)  downgraded 05/02/14 secondary to physical limitations  Problem: RH Bed Mobility Goal: LTG Patient will perform bed mobility with assist (PT) LTG: Patient will perform bed mobility with assistance, with/without cues (PT).  downgraded 05/02/14 secondary to physical limitations  Problem: RH Attention Goal: LTG Patient will demonstrate focused/sustained (PT) LTG: Patient will demonstrate focused attention during functional activities in specific environment with Mod assist for 9minutes (PT)  downgraded 05/02/14 secondary to lack of progress

## 2014-05-02 NOTE — Plan of Care (Signed)
Problem: RH Balance Goal: LTG Patient will maintain dynamic sitting balance (PT) LTG: Patient will maintain dynamic sitting balance with assistance during mobility activities (PT)  Outcome: Not Applicable Date Met:  09/15/11 Patient's CLOF is totalA and anticipated level at discharge is totalA, therefore goal not applicable.  Problem: RH Bed Mobility Goal: LTG Patient will perform bed mobility with assist (PT) LTG: Patient will perform bed mobility with assistance, with/without cues (PT).  Outcome: Not Applicable Date Met:  19/75/88 Patient's CLOF is totalA and anticipated level at discharge is totalA, therefore goal not applicable.  Problem: RH Other (Specify) Goal: RH LTG Other (Specify) Outcome: Not Applicable Date Met:  32/54/98 Goal entered twice in error.  Problem: RH Other (Specify) Goal: RH LTG Other (Specify) Outcome: Not Applicable Date Met:  26/41/58 Goal entered twice in error.

## 2014-05-02 NOTE — Progress Notes (Signed)
Ax. Temps trending back up. Paged Dr.Kwiatowski order for Ua C&S, may I & O cath for spec since patient's incont.. Urine sent, cloudy, white vaginal discharge noted. Slept from 2100-0200. Denied pain. Tube feed still at 83ml/hr per father's request after regurgitating episode on Saturday night. No further problems the rest of weekend. Cornell Barman

## 2014-05-02 NOTE — Progress Notes (Signed)
Speech Language Pathology Weekly Progress and Session Note  Patient Details  Name: [name] MRN: [id number] Date of Birth: 04-03-1981  Beginning of progress report period: Apr 25, 2014 End of progress report period: May 02, 2014  Today's Date: 05/02/2014 Time: 0915-1000 Time Calculation (min): 45 min  Short Term Goals: Week 2: SLP Short Term Goal 1 (Week 2): Patient will sustain attention for 15 seconds with Max multi-modal cues SLP Short Term Goal 1 - Progress (Week 2): Not met SLP Short Term Goal 2 (Week 2): Patient will initiate self-feeding with Max multi-modal cues  SLP Short Term Goal 2 - Progress (Week 2): Not met SLP Short Term Goal 3 (Week 2): Patient will follow 1-step commands in a structured, familiar task with Max assist  SLP Short Term Goal 3 - Progress (Week 2): Not met SLP Short Term Goal 4 (Week 2): Pt will increase speech intelligiblity at the word level to 50% with Max A multimodal cues.  SLP Short Term Goal 4 - Progress (Week 2): Met SLP Short Term Goal 5 (Week 2): Pt will consume ice chip trials without overt s/s of aspiration with Mod A verbal cues.  SLP Short Term Goal 5 - Progress (Week 2): Not met    New Short Term Goals: Week 3: SLP Short Term Goal 1 (Week 3): Patient will sustain attention for 15 seconds with Max multi-modal cues SLP Short Term Goal 2 (Week 3): Patient will initiate self-feeding with Max multi-modal cues  SLP Short Term Goal 3 (Week 3): Patient will follow 1-step commands in a structured, familiar task with Max assist  SLP Short Term Goal 4 (Week 3): Pt will increase speech intelligiblity at the word level to 50% with Mod A multimodal cues.  SLP Short Term Goal 5 (Week 3): Pt will consume ice chip trials without overt s/s of aspiration with Mod A verbal cues.   Weekly Progress Updates: Patient has met 1 of 5 short-term goals this reporting due to patient's CLOF and anticipated discharge at total A/dependent level of function.  Emphasis during second reporting period on rehab has been on functional communication, swallowing function, extensive family education, OOB tolerance, behavioral modifications, decreasing burden of care, discharge planning/home management, and strategies/techniques to decrease agitation and improve participation in self-care activities. Currently, pt is demonstrating behaviors consistent with a Rancho Level IV and requires Max-Total A for all tasks. Pt would benefit from continued skilled SLP intervention to decrease burden of care prior to discharge.    Intensity: Minumum of 1-2 x/day, 30 to 90 minutes Frequency: 5 out of 7 days Duration/Length of Stay: TBD based on progress, 2-3 weeks Treatment/Interventions: Cognitive remediation/compensation;Cueing hierarchy;Dysphagia/aspiration precaution training;Environmental controls;Functional tasks;Internal/external aids;Patient/family education;Speech/Language facilitation;Therapeutic Activities   Daily Session Skilled Therapeutic Interventions: Skilled treatment focused on swallowing and cognitive goals, as well as family education with father present. SLP facilitated session with Max-Total A for one-step commands to perform oral care, sustaining attention for ~3 seconds at a time to the task. Pt then consumed trials of ice chips and water via spoon with Mod cues to initiate a swallow response. Pt did not exhibit overt s/s of aspiration, although anterior spillage was noted x1. Pt demonstrated intermittent verbal expression of wants, staying "another one" and "need another one" spontaneously. Pt then participated in a structured cognitive task, requiring Max-Total A to identify numbers on cards and then to determine which number was greater. Father voiced his concerns regarding length of stay on rehab as well as possible impact of  medications on pt's performance and level of alertness.        FIM:  Comprehension Comprehension Mode: Auditory Comprehension:  1-Understands basic less than 25% of the time/requires cueing 75% of the time Expression Expression Mode: Verbal Expression: 2-Expresses basic 25 - 49% of the time/requires cueing 50 - 75% of the time. Uses single words/gestures. Social Interaction Social Interaction: 1-Interacts appropriately less than 25% of the time. May be withdrawn or combative. Problem Solving Problem Solving: 1-Solves basic less than 25% of the time - needs direction nearly all the time or does not effectively solve problems and may need a restraint for safety Memory Memory: 1-Recognizes or recalls less than 25% of the time/requires cueing greater than 75% of the time FIM - Eating Eating Activity: 3: Helper scoops food on utensil every scoop;2: Hand over hand assist (with PO trials) General    Pain Pain Assessment Pain Assessment: Faces Faces Pain Scale: Hurts little more  Therapy/Group: Individual Therapy   Germain Osgood, M.A. CCC-SLP [phone]  Germain Osgood 05/02/2014, 11:37 AM
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
## 2014-05-02 NOTE — Progress Notes (Signed)
Physical Therapy Weekly Progress Note  Patient Details  Name: [name] MRN: [id number] Date of Birth: 1981/11/16  Beginning of progress report period: Apr 25, 2014 End of progress report period: May 02, 2014  Today's Date: 05/02/2014 Time: 1000-1055 and 1445-1530 Time Calculation (min): 55 min and 45 min  Patient has met 0 of 5 long term goals. Short term goals not set due to patient's CLOF and anticipated discharge at totalA/dependent level of function. Emphasis during second reporting period on rehab has been on identifying deficits, extensive family education, OOB tolerance, behavioral modifications, decreasing burden of care, discharge planning/home management, and strategies/techniques to decrease agitation and improve participation in functional mobility and self-care activities. Emphasis of this week on wheelchair evaluation with ATP and family education/training for discharge home.  Patient continues to demonstrate the following deficits: high tone/spasticity, multiple contractures, poor motor planning/control, poor activity tolerance, decreased sitting balance, decreased postural control in all positions, decreased functional use of all extremities, decreased focused/sustained attention, decreased awareness/insight into deficits, decreased coordination, decreased cognitive-linguistic skills and therefore will continue to benefit from skilled PT intervention to enhance overall performance with activity tolerance, balance, postural control, ability to compensate for deficits, functional use of right upper extremity, right lower extremity, left upper extremity and left lower extremity, attention, awareness, coordination and knowledge of precautions.  Patient not progressing toward long term goals.  See goal revision..  Plan of care revisions: LTGs downgraded last reporting period. Sitting balance and bed mobility goals downgraded to [place] secondary to lack of progress with physical and  functional abilities..  PT Short Term Goals Week 1:  PT Short Term Goal 1 (Week 1): Patient will be able to maintain sitting EOb for 3 min w/o LOB ,using R UE for support, and recieving mod A from therapist. PT Short Term Goal 1 - Progress (Week 1): Discontinued (comment) (goal not appropriate for patient's CLOF) PT Short Term Goal 2 (Week 1): Patient will be able to perform rolling to L side with min A . PT Short Term Goal 2 - Progress (Week 1): Discontinued (comment) (goal not appropriate for patient's CLOF) PT Short Term Goal 3 (Week 1): Patient will be able to maintain proper alligment when in w/c,self reposition head to mid line PT Short Term Goal 3 - Progress (Week 1): Discontinued (comment) (goal not appropriate for patient's CLOF) PT Short Term Goal 4 (Week 1): Patient will be able to perform Active ROM for LE 50% of trials, as a result of increasedd attentionand maintaining focus PT Short Term Goal 4 - Progress (Week 1): Discontinued (comment) (goal not appropriate for patient's CLOF) PT Short Term Goal 5 (Week 1): Patient will be able to reach for objects with 50% accuracy to increase alertness,attention and following instructions. PT Short Term Goal 5 - Progress (Week 1): Discontinued (comment) (goal not appropriate for patient's CLOF) Week 2:  PT Short Term Goal 1 (Week 2): STGs=LTGs secondary to patient's CLOF and anticipated discharge at totalA/dependent level of function. Week 3:  PT Short Term Goal 1 (Week 3): STGs=LTGs secondary to patient's CLOF and anticipated discharge at totalA/dependent level of function.  Skilled Therapeutic Interventions/Progress Updates:    AM Session: Patient received sitting in wheelchair; father, [name], present. Session focused on education/family training on use of manual Hoyer lift and strategies for safe transfers with patient at home. Patient transferred wheelchair>bed via [facility] in order to begin training/education session from the bed.  Education/discussion on parts/functions of manual [employer]. Rolling to B sides with  totalA of one for placement of manual Hoyer lift sling. Landon performed bed>wheelchair transfer with use of manual [employer] with min cues for proper use of equipment. Landon verbalized understanding of all techniques and asked appropriate questions throughout. Will benefit from further training. Patient left sitting in wheelchair at end of session with [name] present for supervision.  Discussion with RN after session that when patient needs to return to bed, to let Landon perform and RN to serve as resource/supervision for safety. RN returned later in AM after patient returned to bed and stated that Landon able to perform transfer via manual [employer] safely without assistance from [employer].  PM Session: Patient received supine in bed; mother, [name], present. Session focused on education/family training on donning/doffing R LE knee ext brace and stretching/tone management. [name] able to don/doff brace appropriately with min cues. Discussion about wearing schedule of 2 hours on, 2 hours off; wrote schedule on white board and educated nursing.  Discussion with RN after session that [name] plans to get patient from bed>wheelchair once manual lift is available (another patient was using during therapy session), to let [name] perform and RN to serve as resource/supervision for safety. RN returned later in PM and stated that [name] able to perform transfer via [place] safely without assistance from [employer].  Therapy Documentation Precautions:  Precautions Precautions: Fall Precaution Comments: severe contractures B ankles, L UE; high tone R LE Restrictions Weight Bearing Restrictions: No Pain: Pain Assessment Pain Assessment: Faces Faces Pain Scale: Hurts little more Locomotion : Ambulation Ambulation/Gait Assistance: Not tested (comment)   See FIM for current functional status  Therapy/Group: Individual  Therapy  [name]. Johnwilliam Shepperson, PT, DPT 05/02/2014, 10:55 AM

## 2014-05-02 NOTE — Progress Notes (Signed)
Occupational Therapy Weekly Progress Note  Patient Details  Name: [name] MRN: [id number] Date of Birth: 25-Oct-1981  Beginning of progress report period: Apr 25, 2014 End of progress report period: May 02, 2014  Today's Date: 05/02/2014 Time: 3833-3832 Time Calculation (min): 45 min  Patient has met 0 of 3 long term goals.  Short term goals not set due to patient's CLOF and anticipated discharge at total/dependent level of function. Short term goals not set due to patient's CLOF and anticipated discharge plans of total assist/dependent. Focus of therapy for this reporting period has been family education, positioning, cognitive remediation, identifying deficits, OOB tolerance, behavioral modifications, and decreasing burden of care for discharge planning.   Patient continues to demonstrate the following deficits: high tone/spasticity, poor postural control in sitting, contractures, poor motor planning/coordination, decreased activity tolerance, decreased sustained attention, decreased strength, impaired overall cognition, functional use of all extremities and therefore will continue to benefit from skilled OT intervention to enhance overall performance with Reduce care partner burden, OOB tolerance, sustained attention, functional use of RUE.    See Patient's Care Plan for progression toward long term goals.  Patient progressing toward long term goals..  Continue plan of care.  Skilled Therapeutic Interventions/Progress Updates:    Pt seen for 1:1 OT session with focus on prolonged stretching to LUE and RLE, bed mobility, sustained attention, and command following. Pt received supine in bed with father present. Father left at beginning of therapy session. Engaged in dressing while supine in bed with pt requiring total assist for rolling left and right. Provided prolonged stretch to LUE and RLE to prevent further contracture and to assist with dressing. Pt tolerating well today and  demonstrating improved extension in L elbow measuring 125 degrees. Pt's peg tube noted to be red with a little blood. Notified RN who assisted with hygiene around tube placement. Pt completed transfer via mechanical lift with +2 assist for safety. Required total assist for washing face with HOH to complete 10% of task. Pt left sitting in w/c with SLP present. Pt with increased lethargy this AM. RN reporting pt did not sleep well last night.   Therapy Documentation Precautions:  Precautions Precautions: Fall Precaution Comments: severe contractures B ankles, L UE; high tone R LE Restrictions Weight Bearing Restrictions: No General:   Vital Signs:   Pain: Pain Assessment Pain Assessment: Faces Faces Pain Scale: Hurts little more  See FIM for current functional status  Therapy/Group: Individual Therapy  Kristina Bertone N Lyanne Kates 05/02/2014, 11:50 AM

## 2014-05-03 ENCOUNTER — Inpatient Hospital Stay (HOSPITAL_COMMUNITY): Payer: No Typology Code available for payment source | Admitting: *Deleted

## 2014-05-03 ENCOUNTER — Inpatient Hospital Stay (HOSPITAL_COMMUNITY): Payer: No Typology Code available for payment source | Admitting: Speech Pathology

## 2014-05-03 ENCOUNTER — Encounter (HOSPITAL_COMMUNITY): Payer: Self-pay

## 2014-05-03 MED ORDER — FREE WATER
150.0000 mL | Freq: Two times a day (BID) | Status: DC
  Administered 2014-05-03 – 2014-05-11 (×17): 150 mL

## 2014-05-03 MED ORDER — JEVITY 1.5 CAL/FIBER PO LIQD
1000.0000 mL | Freq: Every day | ORAL | Status: DC
  Administered 2014-05-03: 150 mL
  Administered 2014-05-03: 75 mL
  Administered 2014-05-03: 225 mL
  Administered 2014-05-03 – 2014-05-04 (×5): 1000 mL
  Administered 2014-05-05: 300 mL
  Administered 2014-05-05: 1000 mL
  Administered 2014-05-05 (×2): 300 mL
  Administered 2014-05-05 – 2014-05-08 (×15): 1000 mL
  Administered 2014-05-09: 300 mL
  Administered 2014-05-09: 1000 mL
  Administered 2014-05-09 (×2): 300 mL
  Administered 2014-05-09: 1000 mL
  Administered 2014-05-10: 300 mL
  Administered 2014-05-10: 1000 mL
  Administered 2014-05-10 (×2): 300 mL
  Administered 2014-05-10 – 2014-05-11 (×3): 1000 mL
  Filled 2014-05-03 (×47): qty 1000

## 2014-05-03 MED ORDER — DANTROLENE SODIUM 25 MG PO CAPS
25.0000 mg | ORAL_CAPSULE | Freq: Three times a day (TID) | ORAL | Status: DC
  Administered 2014-05-03 – 2014-05-11 (×25): 25 mg via ORAL
  Filled 2014-05-03 (×27): qty 1
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
## 2014-05-03 NOTE — Progress Notes (Signed)
Speech Language Pathology Daily Session Note  Patient Details  Name: [name] MRN: [id number] Date of Birth: [birth year]  Today's Date: 05/03/2014 Time: 9983-3825 Time Calculation (min): 40 min  Short Term Goals: Week 3: SLP Short Term Goal 1 (Week 3): Patient will sustain attention for 15 seconds with Max multi-modal cues SLP Short Term Goal 2 (Week 3): Patient will initiate self-feeding with Max multi-modal cues  SLP Short Term Goal 3 (Week 3): Patient will follow 1-step commands in a structured, familiar task with Max assist  SLP Short Term Goal 4 (Week 3): Pt will increase speech intelligiblity at the word level to 50% with Mod A multimodal cues.  SLP Short Term Goal 5 (Week 3): Pt will consume ice chip trials without overt s/s of aspiration with Mod A verbal cues.   Skilled Therapeutic Interventions: Skilled treatment session focused on cognitive goals. Upon arrival, pt asleep in reclining wheelchair and required Max verbal and visual cues for arousal, therefore, PO trials were not attempted. SLP facilitated session by providing Max A verbal cues for pt to utilize a slow rate to increase speech intelligibility at the word level.  Pt also required extra time and Mod A verbal cues to identify the proper color from a field of 2 and follow basic commands with a basic cognitive task. Pt appeared to become restless at the end of session with increased grabbing and pinching of clinician. Pt left in wheelchair with mother present in room. Continue with current plan of care.     FIM:  Comprehension Comprehension Mode: Auditory Comprehension: 1-Understands basic less than 25% of the time/requires cueing 75% of the time Expression Expression Mode: Verbal Expression: 2-Expresses basic 25 - 49% of the time/requires cueing 50 - 75% of the time. Uses single words/gestures. Social Interaction Social Interaction: 1-Interacts appropriately less than 25% of the time. May be withdrawn or  combative. Problem Solving Problem Solving: 1-Solves basic less than 25% of the time - needs direction nearly all the time or does not effectively solve problems and may need a restraint for safety Memory Memory: 1-Recognizes or recalls less than 25% of the time/requires cueing greater than 75% of the time  Pain Pain Assessment Pain Assessment: Faces Faces Pain Scale: No hurt  Therapy/Group: Individual Therapy  Buzzy Han 05/03/2014, 2:50 PM

## 2014-05-03 NOTE — Progress Notes (Signed)
Physical Therapy Session Note  Patient Details  Name: [name] MRN: [id number] Date of Birth: Oct 13, 1981  Today's Date: 05/03/2014 Time: 0930-1030 and 8413-2440 Time Calculation (min): 60 min and 23 min  Short Term Goals: Week 3:  PT Short Term Goal 1 (Week 3): STGs=LTGs secondary to patient's CLOF and anticipated discharge at totalA/dependent level of function.  Skilled Therapeutic Interventions/Progress Updates:    AM Session: Session focused on positioning and tolerance to loaner wheelchair provided by Serafina Royals, [employer], with [employer]. Patient received from OT during mechanical [place] lift transfer performed by her mother, [name]. Patient ultimately transferred to loaner wheelchair. Noted improvements in patient's upright posture, midline orientation, pelvic positioning, and head/neck support.  Patient appears comfortable in wheelchair, even falling asleep. [name] educated on different functions of loaner chair and differences between loaner and what patient will receive from custom wheelchair. Patient left sitting in wheelchair with [name] present for supervision.  Patient's mother, [name], checked off to perform transfers in her room with use of manual [employer].  PM Session: Patient received supine in bed, asleep, [name] present. Session focused on continued family education, stretching/PROM/tone management with emphasis on cervical region. [name] return demonstrated proper techniques for cervical stretching and patient tolerating well. Continued discussion about d/c planning, etc. With [name]. Patient left supine in bed with all need within reach.  Therapy Documentation Precautions:  Precautions Precautions: Fall Precaution Comments: severe contractures B ankles, L UE; high tone R LE Restrictions Weight Bearing Restrictions: No Pain: Pain Assessment Pain Assessment: Faces Faces Pain Scale: No hurt Locomotion : Ambulation Ambulation/Gait Assistance: Not tested (comment)   See  FIM for current functional status  Therapy/Group: Individual Therapy  [name]. Sima Lindenberger, PT, DPT 05/03/2014, 12:16 PM

## 2014-05-03 NOTE — Progress Notes (Signed)
Occupational Therapy Session Note  Patient Details  Name: [name] MRN: [id number] Date of Birth: May 03, 1981  Today's Date: 05/03/2014 Time: 0830-0930 Time Calculation (min): 60 min  Short Term Goals: Week 3:     Skilled Therapeutic Interventions/Progress Updates:    Pt seen for 1:1 OT session with focus on family training/education, prolonged stretching LUE and RLE, bed mobility, initiation, sustained attention and following simple commands. Pt received supine in bed with mother present. Pt demonstrating elbow extension at rest ~120 degrees and tolerated up to 140 degrees during prolonged stretching. Educated mother on providing shoulder flexion with emphasis on support at scapula. Mother returned demonstration and pt tolerating very well. Mother very motivated by this as she able to provide more thorough hygiene with less burden of care. Pt following simple commands during ADLs and bed mobility approx 10% of time and demonstrating sustained attention 3-6 seconds. Mother donned RLE brace with increased time and min cues. Mother completed manual hoyer transfer at supervision level with increased time and no cues. Pt left in w/c with PT.   Therapy Documentation Precautions:  Precautions Precautions: Fall Precaution Comments: severe contractures B ankles, L UE; high tone R LE Restrictions Weight Bearing Restrictions: No General:   Vital Signs:   Pain:  See FIM for current functional status  Therapy/Group: Individual Therapy  [name] N [name] 05/03/2014, 12:11 PM

## 2014-05-03 NOTE — Progress Notes (Signed)
NUTRITION FOLLOW UP  Intervention:   1. Enteral nutrition; Advance TF bolus regimen as follows: Initiate 75 ml of Jevity 1.5 via tube 5 times daily. Advance each bolus by 75 ml until goal bolus regimen of 300 ml 5 times daily is reached. Infuse bolus slowly.  2. Free water; Flush tube with at least 30 mL before and after bolus, and decrease free water flushes to 150 mL 2 times daily.    NUTRITION DIAGNOSIS:  Inadequate oral intake related to inability to eat, dysphagia as evidenced by TF dependent.   Monitor:  1. Enteral nutrition; initiation with tolerance. Pt to meet >/=90% estimated needs with nutrition support.  2. Wt/wt change; monitor trends  Assessment:   Pt admitted with deconditioning s/p TBI. She has trach/PEG.   Pt continues with good tolerance of TF regimen. One instance of regurgitation over the weekend, tube feeds were decreased to 70 mL/hr but have been resumed. She is currently receiving Jevity 1.5 @ 75 mL/hr x 20 hrs daily with 30 mL water flushes q 1 hr which provides 2250 kcal, 95g protein, and 1740 mL free water.  Pt able to continue PO trials. RD will continue to follow for progress with therapy.  Electrolytes stable.   RD consulted for transition to bolus regimen.  Will progress to goal bolus volume of 300 mL 5 times daily with free water flushes daily.    Height: Ht Readings from Last 1 Encounters:  04/15/14 5\' 5"  (1.651 m)    Weight Status:   Wt Readings from Last 1 Encounters:  04/27/14 134 lb 9.6 oz (61.054 kg)    Re-estimated needs:  Kcal: 2000-2200  Protein: 90-105g  Fluid: ~2.0 L/d  Skin: intact  Diet Order: NPO  No intake or output data in the 24 hours ending 05/03/14 1055  Last BM: 5/19   Labs:  No results found for this basename: NA, K, CL, CO2, BUN, CREATININE, CALCIUM, MG, PHOS, GLUCOSE,  in the last 168 hours  CBG (last 3)  No results found for this basename: GLUCAP,  in the last 72 hours  Scheduled Meds: . baclofen  20 mg Per  Tube QID  . botulinum toxin Type A  300 Units Intramuscular Once  . carBAMazepine  300 mg Oral 3 times per day  . dantrolene  25 mg Oral TID  . ferrous sulfate  300 mg Oral BID WC  . free water  150 mL Per Tube TID PC & HS  . HYDROcodone-acetaminophen  1 tablet Per Tube BID  . pantoprazole sodium  40 mg Per Tube Daily  . propranolol  20 mg Per Tube TID  . QUEtiapine  150 mg Oral QHS  . QUEtiapine  50 mg Oral BID  . traZODone  50 mg Oral QHS    Continuous Infusions: . feeding supplement (JEVITY 1.5 CAL/FIBER) 1,000 mL (05/03/14 0810)    Brynda Greathouse, MS RD LDN Clinical Inpatient Dietitian Pager: [phone] Weekend/After hours pager: [phone]

## 2014-05-04 ENCOUNTER — Inpatient Hospital Stay (HOSPITAL_COMMUNITY): Payer: Self-pay

## 2014-05-04 ENCOUNTER — Encounter (HOSPITAL_COMMUNITY): Payer: Self-pay | Admitting: Radiology

## 2014-05-04 ENCOUNTER — Inpatient Hospital Stay (HOSPITAL_COMMUNITY): Payer: Self-pay | Admitting: Occupational Therapy

## 2014-05-04 ENCOUNTER — Inpatient Hospital Stay (HOSPITAL_COMMUNITY): Payer: No Typology Code available for payment source

## 2014-05-04 ENCOUNTER — Inpatient Hospital Stay (HOSPITAL_COMMUNITY): Payer: No Typology Code available for payment source | Admitting: *Deleted

## 2014-05-04 DIAGNOSIS — S069X9A Unspecified intracranial injury with loss of consciousness of unspecified duration, initial encounter: Secondary | ICD-10-CM

## 2014-05-04 DIAGNOSIS — R1310 Dysphagia, unspecified: Secondary | ICD-10-CM

## 2014-05-04 DIAGNOSIS — S42309B Unspecified fracture of shaft of humerus, unspecified arm, initial encounter for open fracture: Secondary | ICD-10-CM

## 2014-05-04 DIAGNOSIS — S069XAA Unspecified intracranial injury with loss of consciousness status unknown, initial encounter: Secondary | ICD-10-CM

## 2014-05-04 DIAGNOSIS — G8250 Quadriplegia, unspecified: Secondary | ICD-10-CM

## 2014-05-04 NOTE — Progress Notes (Signed)
Occupational Therapy Session Note  Patient Details  Name: [name] MRN: [id number] Date of Birth: Sep 24, 1981  Today's Date: 05/04/2014 Time: 1015 (co-tx with SLP 1000-1045)-1045 and 1130-1202 Time Calculation (min): 30 min and 32 min   Short Term Goals: Week 3:  OT Short Term Goal 1 (Week 3): Focus on LTGs  Skilled Therapeutic Interventions/Progress Updates:   Session 1: Pt seen for therapeutic co-x with SLP (CP) with focus on bed mobility, family education, and command following. Pt received supine in bed following bolus feed. Pt incontinent of BM. [name]) voicing concern regarding pt's increase in frequency of BM since beginning bolus feeds. Notified PA of mother's concerns. Performed hygiene rolling in bed with +2 assist (second person for hygiene). Pt crying out in pain during rolling to left when wearing LLE brace. Removed brace and pt tolerated rolling much better. Following hygiene, pt transferred to w/c via [place] with +2 for safety. Pt required total assist for repositioning in chair. Pt left with all needs in reach and mother reporting no questions or concerns at this time.   Session 2: Pt seen for 1:1 OT session with focus on postural control, cervical rotation, sustained attention, initiation, and command following. Pt received in w/c with mother present, however mother did not stay for session. Pt transferred to EOB via [place] with +2 assist for safety. Sat EOM with total assist ~15 min. Engaged in balloon tapping activity with emphasis on head control, sustained attention, and command following. Pt following simple command during activity approx 20% of time. Engaged in color identification activity with pt demonstrating accuracy 50% of time with field of 2 and 0% accuracy with field of 3. Pt returned sit>supine with total assist. Pt incontinent of bowel and bladder. Completed hygiene with +2 assist. Notified RN of BM and encouraged to frequently check on pt secondary  to increase in frequency of BM with bolus feeds. Pt left supine in bed with HOB elevated and mother and father present.    Therapy Documentation Precautions:  Precautions Precautions: Fall Precaution Comments: severe contractures B ankles, L UE; high tone R LE Restrictions Weight Bearing Restrictions: No General:   Vital Signs:   Pain:   ADL: ADL ADL Comments: see FIM Exercises:   Other Treatments:    See FIM for current functional status  Therapy/Group: Individual Therapy and Co-Treatment  [name] 05/04/2014, 12:13 PM

## 2014-05-04 NOTE — Progress Notes (Signed)
Physical Therapy Session Note  Patient Details  Name: [name] MRN: [id number] Date of Birth: September 06, 1981  Today's Date: 05/04/2014 Time: 5027-7412 Time Calculation (min): 35 min  Short Term Goals: Week 3:  PT Short Term Goal 1 (Week 3): STGs=LTGs secondary to patient's CLOF and anticipated discharge at totalA/dependent level of function.  Skilled Therapeutic Interventions/Progress Updates:    Patient received semi-reclined in bed, father present. Patient very lethargic, difficult to arouse even with max multimodal cues. Patient eventually would open eyes with sternal rub, but only able to maintain eyes open 5-7" before appearing to fall asleep again. Session focused on continued family training/education with patient's father on safety with mobility techniques. Patient's father transferred patient bed>wheelchair via [employer] lift with min cues. Patient positioned in wheelchair and participated in pet therapy with pet therapy dog. With [facility] assist, patient able to pet dog, but unable to maintain when [facility] assist removed. Patient falling asleep without tactile cues. Attempted to have patient water plants, but patient unable to maintain eyes open. Patient returned to room and left tilted in wheelchair with father present for supervision.  Therapy Documentation Precautions:  Precautions Precautions: Fall Precaution Comments: severe contractures B ankles, L UE; high tone R LE Required Braces or Orthoses: Knee Immobilizer - Right Knee Immobilizer - Right: On at all times Restrictions Weight Bearing Restrictions: No General: Amount of Missed PT Time (min): 40 Minutes Missed Time Reason: Patient fatigue Pain: Pain Assessment Pain Assessment: Faces Faces Pain Scale: Hurts little more Locomotion : Ambulation Ambulation/Gait Assistance: Not tested (comment)   See FIM for current functional status  Therapy/Group: Individual Therapy  [name]. Saleha Kalp, PT, DPT 05/04/2014,  4:03 PM
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
## 2014-05-04 NOTE — Progress Notes (Signed)
Speech Language Pathology Daily Session Note  Patient Details  Name: [name] MRN: [id number] Date of Birth: 11-Dec-1980  Today's Date: 05/04/2014 Time: 1000-1015 Time Calculation (min): 15 min  Short Term Goals: Week 3: SLP Short Term Goal 1 (Week 3): Patient will sustain attention for 15 seconds with Max multi-modal cues SLP Short Term Goal 2 (Week 3): Patient will initiate self-feeding with Max multi-modal cues  SLP Short Term Goal 3 (Week 3): Patient will follow 1-step commands in a structured, familiar task with Max assist  SLP Short Term Goal 4 (Week 3): Pt will increase speech intelligiblity at the word level to 50% with Mod A multimodal cues.  SLP Short Term Goal 5 (Week 3): Pt will consume ice chip trials without overt s/s of aspiration with Mod A verbal cues.   Skilled Therapeutic Interventions: Pt seen for therapeutic co-x with OT (KP) with focus on cognitive-linguistic goals, family education and bed mobility. Pt received supine in bed following bolus feed. Pt incontinent of BM and pt's mother(Janet) voiced concern regarding pt's increase in frequency of BM since beginning bolus feeds. Notified PA of mother's concerns. Performed hygiene rolling in bed with +2 assist (second person for hygiene). Pt crying out in pain during rolling to left when wearing LLE brace. Removed brace and pt tolerated rolling without difficulty. Following hygiene, pt transferred to w/c via [place] with +2 for safety. Pt required Max assist to sustain attention to self-care tasks and for speech intelligibility at the word level. Pt also required total assist for repositioning in chair. Pt's mother educated on SLP goals of MBS tomorrow to assess dysphagia. Pt's mother reported understanding and all questions answered. Pt left with all needs in reach and mother reporting no questions or concerns at this time. Continue with current plan of care.    FIM:  Comprehension Comprehension Mode:  Auditory Comprehension: 1-Understands basic less than 25% of the time/requires cueing 75% of the time Expression Expression Mode: Verbal Expression: 2-Expresses basic 25 - 49% of the time/requires cueing 50 - 75% of the time. Uses single words/gestures. Social Interaction Social Interaction: 1-Interacts appropriately less than 25% of the time. May be withdrawn or combative. Problem Solving Problem Solving: 1-Solves basic less than 25% of the time - needs direction nearly all the time or does not effectively solve problems and may need a restraint for safety Memory Memory: 1-Recognizes or recalls less than 25% of the time/requires cueing greater than 75% of the time  Pain Pain Assessment Pain Assessment: Faces Faces Pain Scale: Hurts little more  Therapy/Group: Individual Therapy  Buzzy Han 05/04/2014, 4:40 PM

## 2014-05-04 NOTE — Progress Notes (Signed)
Occupational Therapy Session Note  Patient Details  Name: [name] MRN: [id number] Date of Birth: 12/21/1980  Today's Date: 05/04/2014 Time: 1400-1430 Time Calculation (min): 30 min  Skilled Therapeutic Interventions/Progress Updates:    Patient seen this pm for OT intervention to address passive range of motion and education to left shoulder, elbow, forearm, wrist, and digits.  Patient's father present for this session.  Educated him regarding rationale for maintaining current range of motion and increasing motion following injection to decrease muscle spasticity - for preservation of joints and improve ability to maintain hygiene especially in axilla and hand.  Patient's palm, although very tight, no skin breakdown noted.    Therapy Documentation Precautions:  Precautions Precautions: Fall Precaution Comments: severe contractures B ankles, L UE; high tone R LE Required Braces or Orthoses: Knee Immobilizer - Right Knee Immobilizer - Right: On at all times Restrictions Weight Bearing Restrictions: No  Pain: Patient more vocal with range to forearm or shoulder than hand  ADL: ADL ADL Comments: see FIM Exercises:   Other Treatments:    See FIM for current functional status  Therapy/Group: Individual Therapy  Mariah Milling 05/04/2014, 4:14 PM

## 2014-05-05 ENCOUNTER — Inpatient Hospital Stay (HOSPITAL_COMMUNITY): Payer: No Typology Code available for payment source

## 2014-05-05 ENCOUNTER — Inpatient Hospital Stay (HOSPITAL_COMMUNITY): Payer: Self-pay | Admitting: Occupational Therapy

## 2014-05-05 ENCOUNTER — Inpatient Hospital Stay (HOSPITAL_COMMUNITY): Payer: No Typology Code available for payment source | Admitting: Speech Pathology

## 2014-05-05 ENCOUNTER — Encounter (HOSPITAL_COMMUNITY): Payer: Self-pay | Admitting: Occupational Therapy

## 2014-05-05 ENCOUNTER — Inpatient Hospital Stay (HOSPITAL_COMMUNITY): Payer: No Typology Code available for payment source | Admitting: *Deleted
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
## 2014-05-05 NOTE — Progress Notes (Signed)
NUTRITION FOLLOW UP  Intervention:   1. Enteral nutrition; Continue with Jevity 1.5 at goal bolus regimen of 300 ml 5 times daily  2. Free water; Flush tube with at least 30 mL before and after bolus, and decrease free water flushes to 150 mL 2 times daily.    NUTRITION DIAGNOSIS:  Inadequate oral intake related to inability to eat, dysphagia as evidenced by TF dependent.   Monitor:  1. Enteral nutrition; initiation with tolerance. Pt to meet >/=90% estimated needs with nutrition support.  2. Wt/wt change; monitor trends 3. Swallow profle  Assessment:   5/26:Pt admitted with deconditioning s/p TBI. She has trach/PEG.   Pt continues with good tolerance of TF regimen. One instance of regurgitation over the weekend, tube feeds were decreased to 70 mL/hr but have been resumed. She is currently receiving Jevity 1.5 @ 75 mL/hr x 20 hrs daily with 30 mL water flushes q 1 hr which provides 2250 kcal, 95g protein, and 1740 mL free water.  Pt able to continue PO trials. RD will continue to follow for progress with therapy.  Electrolytes stable.   RD consulted for transition to bolus regimen.  Will progress to goal bolus volume of 300 mL 5 times daily with free water flushes daily.    5/28: -RD consulted d/t pt's increased loose stools; however this has since improved today. This was likely r/t transition from continuous feeds to bolus feeds and/or medications -Per discussion with pt's family, pt currently tolerating Jevity 1.5 at 300 ml bolus five times/day. Denied any abd discomfort, nausea or emesis. No questions concerning bolus feeds and/or enteral nutrition -Underwent MBS on 5/28. SLP recommend to trial Dys1/thin on 5/29  -Wound care is being provided for tracheostomy, MD noted pt healing well.  Height: Ht Readings from Last 1 Encounters:  04/15/14 5\' 5"  (1.651 m)    Weight Status:   Wt Readings from Last 1 Encounters:  05/04/14 130 lb 1.1 oz (59 kg)    Re-estimated needs:  Kcal:  2000-2200  Protein: 90-105g  Fluid: ~2.0 L/d  Skin: intact  Diet Order: NPO   Intake/Output Summary (Last 24 hours) at 05/05/14 1534 Last data filed at 05/05/14 1014  Gross per 24 hour  Intake    450 ml  Output      0 ml  Net    450 ml    Last BM: 5/19   Labs:  No results found for this basename: NA, K, CL, CO2, BUN, CREATININE, CALCIUM, MG, PHOS, GLUCOSE,  in the last 168 hours  CBG (last 3)  No results found for this basename: GLUCAP,  in the last 72 hours  Scheduled Meds: . baclofen  20 mg Per Tube QID  . botulinum toxin Type A  300 Units Intramuscular Once  . carBAMazepine  300 mg Oral 3 times per day  . dantrolene  25 mg Oral TID  . feeding supplement (JEVITY 1.5 CAL/FIBER)  1,000 mL Per Tube 5 X Daily  . ferrous sulfate  300 mg Oral BID WC  . free water  150 mL Per Tube BID BM  . HYDROcodone-acetaminophen  1 tablet Per Tube BID  . pantoprazole sodium  40 mg Per Tube Daily  . propranolol  20 mg Per Tube TID  . QUEtiapine  150 mg Oral QHS  . QUEtiapine  50 mg Oral BID  . traZODone  50 mg Oral QHS    Continuous Infusions:   Atlee Abide MS RD LDN Clinical Dietitian [phone]

## 2014-05-05 NOTE — Progress Notes (Signed)
Social Work Patient ID: [name], female   DOB: 10-10-1981, 33 y.o.   MRN: [id number]  Please note that targeted d/c date of 6/2 changed to 6/3 with tx team agreement.  Lennart Pall, LCSW

## 2014-05-05 NOTE — Progress Notes (Signed)
Occupational Therapy Session Note  Patient Details  Name: MILLETTE HALBERSTAM MRN: [id number] Date of Birth: 1981/01/05  Today's Date: 05/05/2014 Time: 1400-1445 Time Calculation (min): 45 min  Skilled Therapeutic Interventions/Progress Updates:    Patient seen this pm for OT intervention.  Worked with orthotist in fitting elbow extension splint.  Elbow splint with 75 degree extension block.  Abductor wedge applied lengthwise to decrease adduction / internal rotation at glenohumeral joint.  Also patient hand support (carrot) received and placed in left hand.  Patient's dad educated in process to don and doff elbow splint and hand support.  Patient lifted back to bed with help from patient's dad.  Therapy Documentation Precautions:  Precautions Precautions: Fall Precaution Comments: severe contractures B ankles, L UE; high tone R LE Required Braces or Orthoses: Knee Immobilizer - Right Knee Immobilizer - Right: On at all times Restrictions Weight Bearing Restrictions: No Pain: Pain Assessment Pain Assessment: Faces Faces Pain Scale: Hurts even more (with L UE movement) ADL: ADL ADL Comments: see FIM  See FIM for current functional status  Therapy/Group: Individual Therapy  Mariah Milling 05/05/2014, 3:19 PM

## 2014-05-05 NOTE — Progress Notes (Signed)
Occupational Therapy Session Note  Patient Details  Name: [name] MRN: [id number] Date of Birth: Jan 16, 1981  Today's Date: 05/05/2014 Time: 1105-1210 Time Calculation (min): 65 min   Skilled Therapeutic Interventions/Progress Updates:    Patient seen this am for OT intervention to address patient's ability to attend to self, and participate in very basic self care skills.  Patient's father present initially, but excused himself during bath to preserve patient's dignity.  Patient able to clean teeth using toothbrush and suction.  Patient assisted with rolling toward right to aide in perianal hygiene, and diaper change.  Transferred to wheelchair with hydraulic lift.  Patient able to drink form cup with straw without outward signs of aspiration.    Therapy Documentation Precautions:  Precautions Precautions: Fall Precaution Comments: severe contractures B ankles, L UE; high tone R LE Required Braces or Orthoses: Knee Immobilizer - Right Knee Immobilizer - Right: On at all times Restrictions Weight Bearing Restrictions: No   Pain:  Unable to specify pain sight, left arm.   ADL: ADL ADL Comments: see FIM  Therapy/Group: Individual Therapy  Mariah Milling 05/05/2014, 12:39 PM
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
## 2014-05-05 NOTE — Progress Notes (Signed)
Physical Therapy Session Note  Patient Details  Name: [name] MRN: [id number] Date of Birth: 1981-05-23  Today's Date: 05/05/2014 Time: 2458-0998 Time Calculation (min): 53 min  Short Term Goals: Week 3:  PT Short Term Goal 1 (Week 3): STGs=LTGs secondary to patient's CLOF and anticipated discharge at totalA/dependent level of function.  Skilled Therapeutic Interventions/Progress Updates:    Patient received sitting in wheelchair, father present. Session focused on initiation, sustained attention, and command following with cognitive remediation activities and therapeutic activities. Emphasis on color and picture identification/recognition and bowling task. Patient requires totalA/HOH assist for all activities and unable to sustain attention for more than 3-5". Further education with patient's father about cervical stretching, but unable to perform with patient sitting in wheelchair due to excessive head movements. Patient left sitting in wheelchair with seatbelt donned.  Therapy Documentation Precautions:  Precautions Precautions: Fall Precaution Comments: severe contractures B ankles, L UE; high tone R LE Required Braces or Orthoses: Knee Immobilizer - Right Knee Immobilizer - Right: On at all times Restrictions Weight Bearing Restrictions: No Pain: Pain Assessment Pain Assessment: Faces Faces Pain Scale: Hurts even more (with L UE movement) Locomotion : Ambulation Ambulation/Gait Assistance: Not tested (comment)   See FIM for current functional status  Therapy/Group: Individual Therapy  [name]. Srihaan Mastrangelo, PT, DPT 05/05/2014, 2:03 PM

## 2014-05-06 ENCOUNTER — Encounter (HOSPITAL_COMMUNITY): Payer: Self-pay

## 2014-05-06 ENCOUNTER — Inpatient Hospital Stay (HOSPITAL_COMMUNITY): Payer: No Typology Code available for payment source | Admitting: Speech Pathology

## 2014-05-06 ENCOUNTER — Inpatient Hospital Stay (HOSPITAL_COMMUNITY): Payer: No Typology Code available for payment source | Admitting: *Deleted

## 2014-05-06 ENCOUNTER — Inpatient Hospital Stay (HOSPITAL_COMMUNITY): Payer: Self-pay

## 2014-05-06 DIAGNOSIS — R1310 Dysphagia, unspecified: Secondary | ICD-10-CM

## 2014-05-06 DIAGNOSIS — G8250 Quadriplegia, unspecified: Secondary | ICD-10-CM

## 2014-05-06 DIAGNOSIS — S069XAA Unspecified intracranial injury with loss of consciousness status unknown, initial encounter: Secondary | ICD-10-CM

## 2014-05-06 DIAGNOSIS — S069X9A Unspecified intracranial injury with loss of consciousness of unspecified duration, initial encounter: Secondary | ICD-10-CM

## 2014-05-06 DIAGNOSIS — S42309B Unspecified fracture of shaft of humerus, unspecified arm, initial encounter for open fracture: Secondary | ICD-10-CM

## 2014-05-06 NOTE — Progress Notes (Signed)
Physical Therapy Session Note  Patient Details  Name: [name] MRN: [id number] Date of Birth: Apr 12, 1981  Today's Date: 05/06/2014 Time: 1100-1155 Time Calculation (min): 55 min  Short Term Goals: Week 3:  PT Short Term Goal 1 (Week 3): STGs=LTGs secondary to patient's CLOF and anticipated discharge at totalA/dependent level of function.  Skilled Therapeutic Interventions/Progress Updates:    Patient received sitting in wheelchair with aunt, [name], present. Session focused on family education in regards to cervical PROM/stretching and soft tissue mobilization. [name] return demonstrated all techniques and patient tolerated well with progression. Patient left sitting in wheelchair for 1pm session, Tanya present.  Therapy Documentation Precautions:  Precautions Precautions: Fall Precaution Comments: severe contractures B ankles, L UE; high tone R LE Required Braces or Orthoses: Knee Immobilizer - Right;Other Brace/Splint Knee Immobilizer - Right: On at all times Other Brace/Splint: L elbow extension brace on at all times Restrictions Weight Bearing Restrictions: No Pain: Pain Assessment Pain Assessment: Faces Faces Pain Scale: Hurts even more (with cervical stretching) Locomotion : Ambulation Ambulation/Gait Assistance: Not tested (comment)   See FIM for current functional status  Therapy/Group: Individual Therapy  [name]. Raylea Adcox, PT, DPT 05/06/2014, 12:08 PM

## 2014-05-06 NOTE — Progress Notes (Signed)
Occupational Therapy Session Note  Patient Details  Name: [name] MRN: [id number] Date of Birth: 06-Aug-1981  Today's Date: 05/06/2014 Time: 0945 (co-tx with SLP (CP) [id number] )-1000 and 1300-1330  Time Calculation (min): 15 min and 30 min   Short Term Goals: Week 3:  OT Short Term Goal 1 (Week 3): Focus on LTGs  Skilled Therapeutic Interventions/Progress Updates:    Session 1: Pt seen for therapeutic co-tx with SLP (CP) with focus on sustained attention, initiation, command following, and bed mobility. Pt received supine in bed with father present. Pt required total assist for bed mobility for placing lift pad. Required +2 assist for safety for transfer to w/c using Maxi sky. Completed breakfast with pt sustained LUE placed on table for increase in shoulder flexion approx 30 min during eating. Pt required mod cues for swallowing techniques and total assist for self-feeding secondary to impaired motor planning and coordination. Pt returned to room and engaged in oral care at setup assist and mod cues for completion. Pt initiating oral care when presented suction brush. Pt left sitting in w/c with father and aunt present.   Session 2: Pt seen for skilled OT session with focus on family education/training for donning LUE elbow extension splint and PROM. Pt received sitting in w/c. Unable to transfer pt back to bed secondary to [facility] being in use with other pts. Removed brace from LUE. Educated pt's aunt on doffing techniques and positioning. No skin breakdown noted. Attempted to don while sitting upright, however, pt not tolerating. Provided education and demo on donning with therapist and aunt reporting understanding. Discussed technique of donning while supine in bed and aunt agreeable. Provided prolonged stretch to LUE and educated/practiced with aunt providing ROM to shoulder with support at scapula. Aunt returned demonstration. Pt left sitting in w/c with aunt reporting no questions or  concerns. Plan to educate more on don/doff elbow extension splint.   Therapy Documentation Precautions:  Precautions Precautions: Fall Precaution Comments: severe contractures B ankles, L UE; high tone R LE Required Braces or Orthoses: Knee Immobilizer - Right;Other Brace/Splint Knee Immobilizer - Right: On at all times Other Brace/Splint: L elbow extension brace on at all times Restrictions Weight Bearing Restrictions: No General: General Amount of Missed OT Time (min): 15 Minutes Vital Signs:   Pain: Pain Assessment Pain Assessment: Faces Faces Pain Scale: Hurts even more (with cervical stretching)  See FIM for current functional status  Therapy/Group: Individual Therapy and Co-Treatment  Mardell Cragg N Iyani Dresner 05/06/2014, 12:13 PM
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
## 2014-05-06 NOTE — Progress Notes (Signed)
Speech Language Pathology Daily Session Notes  Patient Details  Name: [name] MRN: [id number] Date of Birth: 1981/01/12  Today's Date: 05/06/2014  Session 1 Time: 0800-0825 Time Calculation (min): 25 min  Session 2 Time: 1000-1030 Time Calculation: 30 min  Short Term Goals: Week 3: SLP Short Term Goal 1 (Week 3): Patient will sustain attention for 15 seconds with Max multi-modal cues SLP Short Term Goal 2 (Week 3): Patient will initiate self-feeding with Max multi-modal cues  SLP Short Term Goal 3 (Week 3): Patient will follow 1-step commands in a structured, familiar task with Max assist  SLP Short Term Goal 4 (Week 3): Pt will increase speech intelligiblity at the word level to 50% with Mod A multimodal cues.  SLP Short Term Goal 5 (Week 3): Pt will consume ice chip trials without overt s/s of aspiration with Mod A verbal cues.  SLP Short Term Goal 5 - Progress (Week 3): Met SLP Short Term Goal 6 (Week 3): Pt will consume current diet with total A to minimize overt s/s of aspiration.   Skilled Therapeutic Interventions:  Session 1: Skilled treatment session focused on dysphagia goals and family education. Upon arrival, pt very lethargic in bed and required max multimodal cues and multiple attempts for arousal but was unable to sustain for more than 10 seconds. Provided verbal education with pt's father in regards to pt's current swallowing function and compensatory strategies to utilize to minimize aspiration risk such as pt must be upright in chair, awake/alert, small bites/sips and limit distractions. Pt's father verbalized understanding but will need to be observed with a meal in order to demonstrate understanding.  Pt missed 35 minutes of session due to fatigue. Continue with current plan of care.    Session 2: Co-treatment with OT with focus on proper positioning in her wheelchair for PO intake. Pt's father and aunt present and provided both verbal and visual education on  aspiration precautions, swallowing compensatory strategies and swallowing techniques to minimize aspiration risk with Dys. 1 textures and thin liquids via straw. Pt's family verbalized understanding. Pt consumed less than 10% of meal. Pt did not demonstrate any overt s/s of aspiration but required Mod verbal cues to initiate a swallow. Pt demonstrated increased verbal responses in regards to yes/no questions with wants/needs and choices during PO intake with increased speech intelligibility at the word and phrase level to 90% intelligibility. RN notified of PO consumption and administered bolus feed via PEG. Recommend pt continue bolus feeds but have "snacks" of Dys. 1 textures and thin liquids with staff and family for pleasure. Continue with current plan of care.   FIM:  Comprehension Comprehension Mode: Auditory Comprehension: 3-Understands basic 50 - 74% of the time/requires cueing 25 - 50%  of the time Expression Expression Mode: Verbal Expression: 2-Expresses basic 25 - 49% of the time/requires cueing 50 - 75% of the time. Uses single words/gestures. Social Interaction Social Interaction: 1-Interacts appropriately less than 25% of the time. May be withdrawn or combative. Problem Solving Problem Solving: 1-Solves basic less than 25% of the time - needs direction nearly all the time or does not effectively solve problems and may need a restraint for safety Memory Memory: 1-Recognizes or recalls less than 25% of the time/requires cueing greater than 75% of the time FIM - Eating Eating Activity: 1: Helper feeds patient  Pain No s/s of pain  Therapy/Group: Individual Therapy  Buzzy Han 05/06/2014, 4:20 PM

## 2014-05-07 ENCOUNTER — Ambulatory Visit (HOSPITAL_COMMUNITY): Payer: Self-pay | Admitting: Speech Pathology

## 2014-05-07 ENCOUNTER — Inpatient Hospital Stay (HOSPITAL_COMMUNITY): Payer: No Typology Code available for payment source | Admitting: Physical Therapy

## 2014-05-07 DIAGNOSIS — S42309B Unspecified fracture of shaft of humerus, unspecified arm, initial encounter for open fracture: Secondary | ICD-10-CM

## 2014-05-07 DIAGNOSIS — G8250 Quadriplegia, unspecified: Secondary | ICD-10-CM

## 2014-05-07 DIAGNOSIS — R1310 Dysphagia, unspecified: Secondary | ICD-10-CM

## 2014-05-07 DIAGNOSIS — S069XAA Unspecified intracranial injury with loss of consciousness status unknown, initial encounter: Secondary | ICD-10-CM

## 2014-05-07 DIAGNOSIS — S069X9A Unspecified intracranial injury with loss of consciousness of unspecified duration, initial encounter: Secondary | ICD-10-CM

## 2014-05-07 MED ORDER — SILVER SULFADIAZINE 1 % EX CREA
TOPICAL_CREAM | Freq: Two times a day (BID) | CUTANEOUS | Status: DC
  Administered 2014-05-07 – 2014-05-08 (×3): via TOPICAL
  Administered 2014-05-08: 1 via TOPICAL
  Administered 2014-05-09 – 2014-05-11 (×5): via TOPICAL
  Filled 2014-05-07: qty 85

## 2014-05-07 NOTE — Progress Notes (Signed)
Physical Therapy Session Note  Patient Details  Name: [name] MRN: [id number] Date of Birth: 1981/07/04  Today's Date: 05/07/2014 Time: 4401-0272 Time Calculation (min): 45 min  Short Term Goals: Week 1:  PT Short Term Goal 1 (Week 1): Patient will be able to maintain sitting EOb for 3 min w/o LOB ,using R UE for support, and recieving mod A from therapist. PT Short Term Goal 1 - Progress (Week 1): Discontinued (comment) (goal not appropriate for patient's CLOF) PT Short Term Goal 2 (Week 1): Patient will be able to perform rolling to L side with min A . PT Short Term Goal 2 - Progress (Week 1): Discontinued (comment) (goal not appropriate for patient's CLOF) PT Short Term Goal 3 (Week 1): Patient will be able to maintain proper alligment when in w/c,self reposition head to mid line PT Short Term Goal 3 - Progress (Week 1): Discontinued (comment) (goal not appropriate for patient's CLOF) PT Short Term Goal 4 (Week 1): Patient will be able to perform Active ROM for LE 50% of trials, as a result of increasedd attentionand maintaining focus PT Short Term Goal 4 - Progress (Week 1): Discontinued (comment) (goal not appropriate for patient's CLOF) PT Short Term Goal 5 (Week 1): Patient will be able to reach for objects with 50% accuracy to increase alertness,attention and following instructions. PT Short Term Goal 5 - Progress (Week 1): Discontinued (comment) (goal not appropriate for patient's CLOF)   Therapy Documentation Precautions:  Precautions Precautions: Fall Precaution Comments: severe contractures B ankles, L UE; high tone R LE Required Braces or Orthoses: Knee Immobilizer - Right;Other Brace/Splint Knee Immobilizer - Right: On at all times Other Brace/Splint: L elbow extension brace on at all times Restrictions Weight Bearing Restrictions: No Vital Signs: Therapy Vitals Pulse Rate: 91 BP: 123/90 mmHg Pain:  Therapeutic Activity:(45')  Patient's mother present in  room reports being signed off for [name] already and requesting assistance with donning L UE/elbow ROM splint. Patient's motther was able to donn R Knee ROM brace and to provide manual overpressure into R knee extension while securing flexion stop and lock this into place. L UE splint was sized and fit, the patient's mother looked up the manufacturer's website to see how the brace looks while applied. L splint was app;lied with extra padding positioned at lateral epicondyle for skin protection.  Clean sling was found for the Arjo lift to allow patient to be transferred into w/c.   Therapy/Group: Individual Therapy  [name] 05/07/2014, 10:35 AM
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
## 2014-05-08 ENCOUNTER — Inpatient Hospital Stay (HOSPITAL_COMMUNITY): Payer: No Typology Code available for payment source | Admitting: Physical Therapy
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
## 2014-05-08 NOTE — Progress Notes (Signed)
Physical Therapy Session Note  Patient Details  Name: [name] MRN: [id number] Date of Birth: [birth year]  Today's Date: 05/08/2014 Time: 4174-0814 Time Calculation (min): 60 min  Short Term Goals: Week 3:  PT Short Term Goal 1 (Week 3): STGs=LTGs secondary to patient's CLOF and anticipated discharge at totalA/dependent level of function.  Skilled Therapeutic Interventions/Progress Updates:   Pt received semi reclined in bed, no family present. Max-total A x 1 rolling to place sling, total A x 1 bed <>w/c via mechanical lift. Sitting in w/c, patient combed hair and engaged in using toothbrush at setup assist and mod cues for completion. Pt initiating oral care when presented with suction. Patient able to reach for and drink from cup with straw, taking multiple small sips, without any overt s/s of aspiration. Pt engaged in color and object identification as well as drawing on white board with initial [place] assist. Patient unable to sustain attention > 3-4 min. Initiated transfer via mechanical lift back to bed at end of session, patient becoming increasingly upset and not wanting to get in bed. Father arriving, therefore patient returned to w/c and left sitting with seat belt on with patient's father. Patient calm with return to w/c.   Therapy Documentation Precautions:  Precautions Precautions: Fall Precaution Comments: severe contractures B ankles, L UE; high tone R LE Required Braces or Orthoses: Knee Immobilizer - Right;Other Brace/Splint Knee Immobilizer - Right: On at all times Other Brace/Splint: L elbow extension brace on at all times Restrictions Weight Bearing Restrictions: No Pain: Pain Assessment Pain Assessment: Faces Faces Pain Scale: Hurts even more Pain Type: Acute pain Pain Location: Arm Pain Orientation: Left Pain Descriptors / Indicators: Grimacing;Moaning Pain Onset: On-going Pain Intervention(s): Repositioned  See FIM for current functional  status  Therapy/Group: Individual Therapy  Laretta Alstrom 05/08/2014, 12:55 PM

## 2014-05-09 ENCOUNTER — Inpatient Hospital Stay (HOSPITAL_COMMUNITY): Payer: Self-pay | Admitting: *Deleted

## 2014-05-09 ENCOUNTER — Inpatient Hospital Stay (HOSPITAL_COMMUNITY): Payer: Medicaid Other

## 2014-05-09 ENCOUNTER — Inpatient Hospital Stay (HOSPITAL_COMMUNITY): Payer: No Typology Code available for payment source | Admitting: *Deleted

## 2014-05-09 ENCOUNTER — Inpatient Hospital Stay (HOSPITAL_COMMUNITY): Payer: No Typology Code available for payment source | Admitting: Speech Pathology

## 2014-05-09 ENCOUNTER — Inpatient Hospital Stay (HOSPITAL_COMMUNITY): Payer: Self-pay | Admitting: Speech Pathology

## 2014-05-09 DIAGNOSIS — S069X9A Unspecified intracranial injury with loss of consciousness of unspecified duration, initial encounter: Secondary | ICD-10-CM

## 2014-05-09 DIAGNOSIS — G8250 Quadriplegia, unspecified: Secondary | ICD-10-CM

## 2014-05-09 DIAGNOSIS — S42309B Unspecified fracture of shaft of humerus, unspecified arm, initial encounter for open fracture: Secondary | ICD-10-CM

## 2014-05-09 DIAGNOSIS — R1310 Dysphagia, unspecified: Secondary | ICD-10-CM

## 2014-05-09 DIAGNOSIS — S069XAA Unspecified intracranial injury with loss of consciousness status unknown, initial encounter: Secondary | ICD-10-CM
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
## 2014-05-09 NOTE — Progress Notes (Signed)
Speech Language Pathology Daily Session Notes  Patient Details  Name: [name] MRN: [id number] Date of Birth: [birth year]  Today's Date: 05/09/2014  Session 1 Time: 1115-1130 Time Calculation (min): 15 min  Session 2 Time: None Time Calculations (min): Missed 30 minutes  Short Term Goals: Week 3: SLP Short Term Goal 1 (Week 3): Patient will sustain attention for 15 seconds with Max multi-modal cues SLP Short Term Goal 2 (Week 3): Patient will initiate self-feeding with Max multi-modal cues  SLP Short Term Goal 3 (Week 3): Patient will follow 1-step commands in a structured, familiar task with Max assist  SLP Short Term Goal 4 (Week 3): Pt will increase speech intelligiblity at the word level to 50% with Mod A multimodal cues.  SLP Short Term Goal 5 (Week 3): Pt will consume ice chip trials without overt s/s of aspiration with Mod A verbal cues.  SLP Short Term Goal 5 - Progress (Week 3): Met SLP Short Term Goal 6 (Week 3): Pt will consume current diet with total A to minimize overt s/s of aspiration.   Skilled Therapeutic Interventions:  Session 1: Skilled treatment session focused on cognitive goals and family education.  Upon arrival, pt in bed with OT donning elbow brace, therefore, pt missed 15 minutes of skilled SLP intervention.  SLP facilitated session by providing Max A multimodal cues for rolling in bed for positioning for lift pad. Pt transferred to wheelchair via maxi move with +2 assist for safety. Pt left in wheelchair with father present.    Session 2: Pt missed 30 minutes of skilled SLP intervention due fatigue and lethargy. Upon arrival, pt asleep in bed and was unable to be aroused despite Max multimodal verbal and tactile stimulation and multiple attempts. RN made aware. Pt's father also reports he attempted to arouse pt before beginning of session without success. Continue with current plan of care.   FIM:  Comprehension Comprehension Mode:  Auditory Comprehension: 3-Understands basic 50 - 74% of the time/requires cueing 25 - 50%  of the time Expression Expression Mode: Verbal Expression: 2-Expresses basic 25 - 49% of the time/requires cueing 50 - 75% of the time. Uses single words/gestures. Social Interaction Social Interaction: 2-Interacts appropriately 25 - 49% of time - Needs frequent redirection. Problem Solving Problem Solving: 1-Solves basic less than 25% of the time - needs direction nearly all the time or does not effectively solve problems and may need a restraint for safety Memory Memory: 1-Recognizes or recalls less than 25% of the time/requires cueing greater than 75% of the time  Pain No specific complaints of pain, some moaning with repositioning   Therapy/Group: Individual Therapy  Buzzy Han 05/09/2014, 1:23 PM

## 2014-05-09 NOTE — Progress Notes (Signed)
Occupational Therapy Session Note  Patient Details  Name: [name] MRN: [id number] Date of Birth: 1981/11/30  Today's Date: 05/09/2014 Time: 1430-1500 Time Calculation (min): 30 min  Short Term Goals: Week 3:  OT Short Term Goal 1 (Week 3): Focus on LTGs  Skilled Therapeutic Interventions/Progress Updates:    Pt seen for 1:1 OT session with focus on bed mobility and family training/education. Pt received supine in bed with father reporting pt having incontinent episode. Pt required total assist for hygiene via rolling in bed. Removed LUE brace and educated father on don/doff. Father reporting he didn't think he would need to do this since the pt is going home with her mother however still interested. Discussed checking for skin integrity when removing brace and he was asking appropriate questions. Discussed positioning of brace and wearing tolerance.Therapist donned brace secondary to father declining stating "I know how to do it." Discussed discharge plans and father with no questions or concerns at this time.   Therapy Documentation Precautions:  Precautions Precautions: Fall Precaution Comments: severe contractures B ankles, L UE; high tone R LE Required Braces or Orthoses: Knee Immobilizer - Right;Other Brace/Splint Knee Immobilizer - Right: On at all times Other Brace/Splint: L elbow extension brace on at all times Restrictions Weight Bearing Restrictions: No General:   Vital Signs: Therapy Vitals Pulse Rate: 89 BP: 153/87 mmHg Pain:  See FIM for current functional status  Therapy/Group: Individual Therapy  Bruno Leach [name] 05/09/2014, 3:05 PM

## 2014-05-09 NOTE — Progress Notes (Signed)
Social Work Patient ID: [name], female   DOB: 1981-06-04, 33 y.o.   MRN: [id number]  CSW spoke with pt's therapists to determine what DME pt will need at home in preparation for 05-11-14 d/c.  CSW placed order with [facility] for tube feeds, hospital bed, hoyer lift, and home suctioning.  DME to be delivered to pt's mother's home and her phone number and address given to [facility] so that they can coordinate delivery.  CSW to continue to follow and assist as needed.

## 2014-05-09 NOTE — Progress Notes (Signed)
Occupational Therapy Session Note  Patient Details  Name: [name] MRN: [id number] Date of Birth: 10-Sep-1981  Today's Date: 05/09/2014 Time: 1100-1215 Time Calculation (min): 75 min  Short Term Goals: Week 1:  OT Short Term Goal 1 (Week 1): Patient will complete 20% of grooming task with setup OT Short Term Goal 1 - Progress (Week 1): Discontinued (comment) (Discontinued STGs secondary to shift to focus on decreasing burden of care and caregiver training/education. Focus on LTGs) OT Short Term Goal 2 (Week 1): Patient will maintain supported sitting balance for 1 minute at edge of bed using bed rail. OT Short Term Goal 2 - Progress (Week 1): Discontinued (comment) OT Short Term Goal 3 (Week 1): Patient will tolerate 10 minutes of PROM of LUE with 1 rest break  OT Short Term Goal 3 - Progress (Week 1): Discontinued (comment) OT Short Term Goal 4 (Week 1): Patient will select appropriate grooming aid presented among 2 items with 1 verbal cue OT Short Term Goal 4 - Progress (Week 1): Discontinued (comment) Week 2:    Week 3:  OT Short Term Goal 1 (Week 3): Focus on LTGs  Skilled Therapeutic Interventions/Progress Updates:    Pt.lying in bed.  Dad, [name] present.  Engaged in therapeutic bathing and dressing at bed level.  Addressed rolling, UE AROM during functional tasks, education on UE and LE orthotics.  Pt rolled to right and left with total assist of 2.  Provided max hand over hand for pt to hold to rail and assist with rolling.  Pt. perseverating on pulling lower left side of lip with right hand.  Had small red area as response.  Did manual mouth movements for sensory stimulation with pt responding in quiet, listening reaction.  Provided hands together for washing self and tactile awareness.  Pt. Would focus on these tasks for a minute.  Doffed UE and LE orthotics for bathing.  Dad donned LE but needed help with UE due to many straps.  Would be good to provide some visual instructions  for family and staff.  Pt has small scabbed on Left olecranon and nursing changed dressing.Used maxi move to get pt in chair.     Therapy Documentation Precautions:  Precautions Precautions: Fall Precaution Comments: severe contractures B ankles, L UE; high tone R LE Required Braces or Orthoses: Knee Immobilizer - Right;Other Brace/Splint Knee Immobilizer - Right: On at all times Other Brace/Splint: L elbow extension brace on at all times Restrictions Weight Bearing Restrictions: No      Pain:  General grimacing; unable to verbalize    ADL: ADL ADL Comments: see FIM        See FIM for current functional status  Therapy/Group: Individual Therapy  Lisa Roca 05/09/2014, 1:42 PM

## 2014-05-09 NOTE — Progress Notes (Signed)
Physical Therapy Note  Patient Details  Name: DANIQUA CAMPOY MRN: [id number] Date of Birth: 11/29/1981 Today's Date: 05/09/2014  Patient missed 60 minutes of skilled physical therapy this PM secondary to fatigue. Patient unable to be aroused with verbal/tactile cues. Sternal rub, patient eventually opens eyes, but unable to maintain open >5". Patient with decreased vocalizations/verbalizations. Will follow up as able.   [place] Shelda Truby, PT, DPT 05/09/2014, 3:39 PM

## 2014-05-10 ENCOUNTER — Inpatient Hospital Stay (HOSPITAL_COMMUNITY): Payer: No Typology Code available for payment source | Admitting: *Deleted

## 2014-05-10 ENCOUNTER — Encounter (HOSPITAL_COMMUNITY): Payer: Self-pay

## 2014-05-10 ENCOUNTER — Inpatient Hospital Stay (HOSPITAL_COMMUNITY): Payer: No Typology Code available for payment source | Admitting: Speech Pathology

## 2014-05-10 ENCOUNTER — Inpatient Hospital Stay (HOSPITAL_COMMUNITY): Payer: Self-pay | Admitting: Occupational Therapy

## 2014-05-10 MED ORDER — CARBAMAZEPINE 100 MG/5ML PO SUSP
200.0000 mg | Freq: Three times a day (TID) | ORAL | Status: DC
  Administered 2014-05-10 – 2014-05-11 (×3): 200 mg via ORAL
  Filled 2014-05-10 (×5): qty 10

## 2014-05-10 MED ORDER — CARBAMAZEPINE 100 MG/5ML PO SUSP: 200.0000 mg | Freq: Three times a day (TID) | ORAL | Status: DC
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
## 2014-05-10 NOTE — Discharge Summary (Signed)
Discharge summary job [phone]

## 2014-05-10 NOTE — Progress Notes (Signed)
Physical Therapy Discharge Summary  Patient Details  Name: [name] MRN: [id number] Date of Birth: 1981/05/11  Today's Date: 05/10/2014 Time: 5009-3818 Time Calculation (min): 35 min  Patient has met 5 of 5 long term goals due to improved activity tolerance, increased range of motion, decreased pain, functional use of  right upper extremity, right lower extremity and left upper extremity and improved attention.  Patient to discharge at a wheelchair level Total Assist.   Patient's mother, [name], father, [name], and aunt, [name]  are independent to provide the necessary physical and cognitive assistance at discharge. Extensive family education/training has been completed in regards to patient's functional mobility with emphasis on bed mobility, dependent manual Hoyer lift transfers, optimal positioning in bed and wheelchair, wheelchair seating and positioning, and wheelchair parts management.  Reasons goals not met: N/A, all LTGs met  Recommendation:  Patient will benefit from ongoing skilled PT services in home health setting to continue to advance safe functional mobility, address ongoing impairments in postural control, activity tolerance, attention, awareness, functional mobility, and minimize fall risk.  Equipment: hospital bed, manual Hoyer lift, L UE brace, R LE brace  Reasons for discharge: treatment goals met and discharge from hospital  Patient/family agrees with progress made and goals achieved: Yes  PT Discharge Precautions/Restrictions Precautions Precautions: Fall Precaution Comments: severe contractures B ankles, L UE; high tone R LE Required Braces or Orthoses: Knee Immobilizer - Right;Other Brace/Splint Knee Immobilizer - Right: On at all times Other Brace/Splint: L elbow extension brace on at all times Restrictions Weight Bearing Restrictions: No Pain Pain Assessment Pain Assessment: No/denies pain Pain Score: 0-No pain Vision/Perception    Visual Report  unable to be assessed secondary to cognitive deficits, likely has visual deficits Perception: Inattention to L side of body and L visual field Praxis: Deficits; Initiation, Ideation, Ideomotor, Perseveratoin, Motor Impersistence, Organization, Limb apraxia  Cognition Overall Cognitive Status: Impaired/Different from baseline Arousal/Alertness: Lethargic Orientation Level: Oriented to person Attention: Focused Focused Attention: Impaired Focused Attention Impairment: Verbal basic;Verbal complex Sustained Attention: Impaired Sustained Attention Impairment: Functional basic;Verbal basic Memory: Impaired Memory Impairment: Decreased recall of new information Awareness: Impaired Awareness Impairment: Intellectual impairment Problem Solving: Impaired Problem Solving Impairment: Functional basic Executive Function: Initiating;Self Monitoring;Reasoning;Sequencing;Organizing;Decision Making;Self Correcting Reasoning: Impaired Sequencing: Impaired Organizing: Impaired Decision Making: Impaired Initiating: Impaired Self Monitoring: Impaired Self Correcting: Impaired Behaviors: Restless;Impulsive;Perseveration Safety/Judgment: Impaired [employer] Scales of Cognitive Functioning: Confused/agitated Sensation Sensation Light Touch: Appears Intact Proprioception: Impaired by gross assessment Coordination Gross Motor Movements are Fluid and Coordinated: No Fine Motor Movements are Fluid and Coordinated: No Motor  Motor Motor: Abnormal tone;Motor apraxia;Abnormal postural alignment and control;Hemiplegia;Motor perseverations  Mobility Bed Mobility Bed Mobility: Rolling Right;Rolling Left;Supine to Sit;Sit to Supine Rolling Right: 1: +1 Total assist Rolling Right Details: Manual facilitation for placement;Manual facilitation for weight shifting Rolling Left: 1: +1 Total assist Rolling Left Details: Manual facilitation for placement;Manual facilitation for weight shifting Supine to  Sit: 1: +2 Total assist;HOB flat;With rails Supine to Sit Details: Manual facilitation for weight shifting;Manual facilitation for placement Sit to Supine: 1: +2 Total assist;HOB flat;With rail Sit to Supine - Details: Manual facilitation for weight shifting;Manual facilitation for placement Transfers Transfers: Yes Transfer via Lift Equipment: [employer] (and Advertising account planner) Locomotion  Ambulation Ambulation: No Ambulation/Gait Assistance: Not tested (comment) Gait Gait: No Stairs / Additional Locomotion Stairs: No  Trunk/Postural Assessment  Cervical Assessment Cervical Assessment: Exceptions to [facility] (forward head posture; R gaze preference) Thoracic Assessment Thoracic Assessment: Exceptions to [facility] (kyphotic  posture) Lumbar Assessment Lumbar Assessment: Within Functional Limits Postural Control Postural Control: Deficits on evaluation Head Control: decreased Trunk Control: decreased Righting Reactions: absent Protective Responses: absent Postural Limitations: totalA sitting EOB, significantly forward flexed posture of head and neck, kyphosis  Balance Balance Balance Assessed: Yes Static Sitting Balance Static Sitting - Balance Support: Right upper extremity supported;No upper extremity supported;Feet supported;Feet unsupported Static Sitting - Level of Assistance: 1: +1 Total assist Static Sitting - Comment/# of Minutes: 10-15 min Extremity Assessment  RUE Assessment RUE Assessment: Within Functional Limits LUE Assessment LUE Assessment: Exceptions to WFL LUE AROM (degrees) Overall AROM Left Upper Extremity:  (flexion contractures; has LUE extension brace and carrot hand splint) RLE Assessment RLE Assessment: Exceptions to [facility] RLE AROM (degrees) Overall AROM Right Lower Extremity: Deficits;Due to premorbid status;Due to impaired cognition RLE Tone RLE Tone: Modified Ashworth;Hypertonic;Moderate Modified Ashworth Scale for Grading Hypertonia RLE:  (3 for hip and  knee; 4 for ankle/foot); Has R LE knee extension brace LLE Assessment LLE Assessment: Exceptions to WFL LLE AROM (degrees) Overall AROM Left Lower Extremity: Deficits;Due to premorbid status;Due to impaired cognition LLE Tone LLE Tone: Moderate;Modified Ashworth Modified Ashworth Scale for Grading Hypertonia LLE: Affected part(s) rigid in flexion or extension (ankle and foot)  See FIM for current functional status  Anea Fodera S Kaedyn Polivka S. Jehan Ranganathan, PT, DPT 05/10/2014, 4:12 PM

## 2014-05-10 NOTE — Progress Notes (Signed)
Occupational Therapy Note  Patient Details  Name: [name] MRN: [id number] Date of Birth: [birth year] Today's Date: 05/10/2014  Patient supine in bed upon arrival.  Sleeping soundly.  Neither parent present in room.  Family education mostly completed with the exception of mother wanting instruction on elbow brace.  Unable to wake patient, no family present.  Session cancelled.  Spoke with primary OT to inform.    Mariah Milling 05/10/2014, 3:15 PM

## 2014-05-10 NOTE — Progress Notes (Signed)
Occupational Therapy Discharge Summary  Patient Details  Name: [name] MRN: [id number] Date of Birth: [birth year]  Today's Date: 05/10/2014 Time: 0930-1030 Time Calculation (min): 60 min  Patient has met 2 of 3 long term goals due to improved activity tolerance, postural control, improved attention and increased ROM in LUE and RLE.  Patient to discharge at overall Total Assist level.  Patient's caregivers (mother, father, aunt [name]) is independent to provide the necessary physical and cognitive assistance at discharge. All family has participated in family training/education and reports no questions or concerns at this time. Mother has demonstrated good carryover with hygiene and optimal positioning throughout the patient's rehab stay.   Reasons goals not met: Patient did not meet LTG of sitting balance as she requires total assist.   Recommendation:  Patient will benefit from ongoing skilled OT services in home health setting to continue to advance functional skills in the area of addressing ongoing impairments with postural control, activity tolernace, and cognitive impairments. .  Equipment: hospital bed, manual Hoyer lift, LUE, RLE brace  Reasons for discharge: treatment goals met and discharge from hospital  Patient/family agrees with progress made and goals achieved: Yes  Skilled Therapeutic Intervention Pt seen for skilled OT with focus on family training/education, sustained attention, command following, and bed mobility. Pt received supine in bed with father present. Orthotist entered room and assisted with removing some straps from LUE brace to increase of donning/doffing splint. Father educated on don/doff splint and reports no questions or concerns at this time. Father also reported better understanding of LUE brace following the removal of 2 straps. Plan to educate mother later in regards to brace. Father declining any other questions or concerns at this time and left  room for therapy session as he does not plan on assisting with ADLs. Completed dressing while supine in bed with max cues for command following during bed mobility. Pt very fatigued requiring external cues (sternal rub, increase in lighting, etc) for arousal throughout. Transferred to w/c via [employer]. Engaged in oral care with min assist to complete and pt sustaining attention 2-3 seconds before having blank stares with no response to external stimuli. Provided HOH assist for applying lotion with emphasis on grading touch. Provided external aid for orientation to time with pt requiring max cues for reading, demonstrating accuracy 25% of time. Aunt entered at end of session and reported no questions or concerns about discharge at this time. Pt left sitting in w/c with all needs in reach and family present.   OT Discharge Precautions/Restrictions  Precautions Precautions: Fall Precaution Comments: severe contractures B ankles, L UE; high tone R LE Required Braces or Orthoses: Knee Immobilizer - Right;Other Brace/Splint Knee Immobilizer - Right: On at all times Other Brace/Splint: L elbow extension brace on at all times Restrictions Weight Bearing Restrictions: No General Amount of Missed OT Time (min): 30 Minutes Missed Time Reason: Patient fatigue Vital Signs Therapy Vitals Pulse Rate: 99 BP: 142/80 mmHg Pain   ADL ADL ADL Comments: see FIM Vision/Perception  Vision- History Baseline Vision/History: Wears glasses Wears Glasses: Reading only Patient Visual Report: Other (comment) (unable to assess secondary to cognitive deficits) Vision- Assessment Vision Assessment?: Vision impaired- to be further tested in functional context (unable to formally assess secondary to cognitive deficits)  Cognition Overall Cognitive Status: Impaired/Different from baseline Arousal/Alertness: Lethargic Orientation Level: Oriented to person Attention: Focused Focused Attention: Impaired Focused  Attention Impairment: Verbal basic;Verbal complex Sustained Attention: Impaired Sustained Attention Impairment: Functional basic;Verbal basic Memory: Impaired  Memory Impairment: Decreased recall of new information Awareness: Impaired Awareness Impairment: Intellectual impairment Problem Solving: Impaired Problem Solving Impairment: Functional basic Executive Function: Initiating;Self Monitoring;Reasoning;Sequencing;Organizing;Decision Making;Self Correcting Reasoning: Impaired Sequencing: Impaired Organizing: Impaired Decision Making: Impaired Initiating: Impaired Self Monitoring: Impaired Self Correcting: Impaired Behaviors: Restless;Impulsive;Perseveration Safety/Judgment: Impaired [employer] Scales of Cognitive Functioning: Confused/agitated Sensation Sensation Light Touch: Appears Intact Proprioception: Impaired by gross assessment Coordination Gross Motor Movements are Fluid and Coordinated: No Fine Motor Movements are Fluid and Coordinated: No Motor    Mobility     Trunk/Postural Assessment     Balance   Extremity/Trunk Assessment RUE Assessment RUE Assessment: Within Functional Limits LUE Assessment LUE Assessment: Exceptions to WFL LUE AROM (degrees) Overall AROM Left Upper Extremity:  (flexion contractures; has LUE extension brace and carrot hand splint)  See FIM for current functional status   N  05/10/2014, 12:23 PM

## 2014-05-10 NOTE — Progress Notes (Signed)
Speech Language Pathology Session Note & Discharge Summary  Patient Details  Name: [name] MRN: [id number] Date of Birth: 1981/12/04  Today's Date: 05/10/2014 Time: 7371-0626 Time Calculation (min): 35 min  Skilled Therapeutic Interventions:  Skilled treatment session focused on dysphagia goals and family education. Upon arrival, pt was lethargic but sitting up in her wheelchair. Pt's consumed snack of Dys. 1 textures with thin liquids via straw without overt s/s of aspiration with Mod A verbal cues for swallow initiation due to decreased attention to bolus. Pt consumed ~4 bites of ice cream and 4 sips of thin liquids. Pt's father administered the snack and provided appropriate cueing throughout. Pt was lethargic throughout the session and required Max A multimodal cues for arousal for ~30 seconds. Pt's father and aunt verbalized no questions at this time but this clinician reinforced all previously learned information in regards to swallowing function, diet recommendations, compensatory strategies, aspiration precautions, eliciting verbal responses and increased speech intelligibility, following commands, attention and initiation. All education is complete with all members of the pt's family who will be involved in her care and pt will discharge home with 24 hour supervision.  Patient has met 3 of 4 long term goals.  Patient to discharge at [facility] Max;Total level.   Reasons goals not met: Pt continues to require Max A for sustained attention to functional tasks for ~30 seconds   Clinical Impression/Discharge Summary: Pt has made functional gains and has met 3 of 4 LTG's this admission due to increased swallowing function, speech intelligibility at the word level and ability to follow simple commands.  Currently, pt is demonstrating behaviors consistent with a [place] Level IV and requires Max-Total A for sustained attention, orientation, initiation, problem solving, awareness, safety and  problem solving.  Pt's overall speech intelligibility has improved at the word level to ~75% and pt can follow basic commands with 25% accuracy and Max A multimodal cues. Pt had MBS on 5/28 and can consume Dys. 1 textures with thin liquids via spoon or straw without overt s/s of aspiration. Pt is unable to maintain her nutritional needs on a PO diet due to her impaired attention, therefore, recommend pt continue bolus feeds via PEG but pt can consume Dys. 1 textures and thin liquids for pleasure per family's preference. Pt/family education complete and pt will discharge home with 24 hour supervision from family. Pt would benefit from f/u SLP services to maximize her cognitive and swallowing function and overall functional communication in order to reduce caregiver burden.   Care Partner:  Caregiver Able to Provide Assistance: Yes  Type of Caregiver Assistance: Physical;Cognitive  Recommendation:  24 hour supervision/assistance;Home Health SLP  Rationale for SLP Follow Up: Maximize functional communication;Maximize cognitive function and independence;Reduce caregiver burden;Maximize swallowing safety   Equipment: Suction    Reasons for discharge: Discharged from hospital   Patient/Family Agrees with Progress Made and Goals Achieved: Yes   See FIM for current functional status  Buzzy Han 05/10/2014, 3:56 PM

## 2014-05-11 DIAGNOSIS — R1310 Dysphagia, unspecified: Secondary | ICD-10-CM

## 2014-05-11 DIAGNOSIS — S069XAA Unspecified intracranial injury with loss of consciousness status unknown, initial encounter: Secondary | ICD-10-CM

## 2014-05-11 DIAGNOSIS — S069X9A Unspecified intracranial injury with loss of consciousness of unspecified duration, initial encounter: Secondary | ICD-10-CM

## 2014-05-11 DIAGNOSIS — G8250 Quadriplegia, unspecified: Secondary | ICD-10-CM

## 2014-05-11 DIAGNOSIS — S42309B Unspecified fracture of shaft of humerus, unspecified arm, initial encounter for open fracture: Secondary | ICD-10-CM

## 2014-05-11 LAB — BASIC METABOLIC PANEL
BUN: 23 mg/dL (ref 6–23)
CO2: 30 meq/L (ref 19–32)
Calcium: 10.1 mg/dL (ref 8.4–10.5)
Chloride: 100 mEq/L (ref 96–112)
Creatinine, Ser: 0.42 mg/dL — ABNORMAL LOW (ref 0.50–1.10)
GFR calc Af Amer: >90 mL/min (ref >90)
GLUCOSE: 98 mg/dL (ref 70–99)
Potassium: 4.5 mEq/L (ref 3.7–5.3)
SODIUM: 141 meq/L (ref 137–147)

## 2014-05-11 LAB — CBC
HEMATOCRIT: 35.6 % — AB (ref 36.0–46.0)
HEMOGLOBIN: 11.9 g/dL — AB (ref 12.0–15.0)
MCH: 30.4 pg (ref 26.0–34.0)
MCHC: 33.4 g/dL (ref 30.0–36.0)
MCV: 90.8 fL (ref 78.0–100.0)
Platelets: 185 10*3/uL (ref 150–400)
RBC: 3.92 MIL/uL (ref 3.87–5.11)
RDW: 16.1 % — AB (ref 11.5–15.5)
WBC: 7.3 10*3/uL (ref 4.0–10.5)

## 2014-05-11 LAB — CARBAMAZEPINE LEVEL, TOTAL: Carbamazepine Lvl: 5 ug/mL (ref 4.0–12.0)

## 2014-05-11 MED ORDER — FERROUS SULFATE 300 (60 FE) MG/5ML PO SYRP: 300.0000 mg | ORAL_SOLUTION | Freq: Two times a day (BID) | ORAL | Status: DC

## 2014-05-11 MED ORDER — CARBAMAZEPINE 100 MG/5ML PO SUSP
200.0000 mg | Freq: Three times a day (TID) | ORAL | Status: DC
Start: 2014-05-11 — End: 2014-10-12

## 2014-05-11 MED ORDER — BACLOFEN 20 MG PO TABS: 20.0000 mg | ORAL_TABLET | Freq: Four times a day (QID) | ORAL | Status: DC

## 2014-05-11 MED ORDER — QUETIAPINE FUMARATE 50 MG PO TABS: ORAL_TABLET | ORAL | Status: DC

## 2014-05-11 MED ORDER — TRAMADOL HCL 50 MG PO TABS: 50.0000 mg | ORAL_TABLET | Freq: Four times a day (QID) | ORAL | Status: DC | PRN

## 2014-05-11 MED ORDER — DANTROLENE SODIUM 25 MG PO CAPS: 25.0000 mg | ORAL_CAPSULE | Freq: Three times a day (TID) | ORAL | Status: DC

## 2014-05-11 MED ORDER — LORAZEPAM 0.5 MG PO TABS: 0.5000 mg | ORAL_TABLET | Freq: Four times a day (QID) | ORAL | Status: DC | PRN

## 2014-05-11 MED ORDER — JEVITY 1.5 CAL/FIBER PO LIQD
1000.0000 mL | Freq: Every day | ORAL | Status: DC
Start: 2014-05-11 — End: 2015-01-18

## 2014-05-11 MED ORDER — TRAZODONE HCL 50 MG PO TABS: 50.0000 mg | ORAL_TABLET | Freq: Every day | ORAL | Status: DC

## 2014-05-11 MED ORDER — PROPRANOLOL HCL 20 MG/5ML PO SOLN: 40.0000 mg | Freq: Three times a day (TID) | ORAL | Status: DC

## 2014-05-11 MED ORDER — PANTOPRAZOLE SODIUM 40 MG PO PACK
40.0000 mg | PACK | Freq: Every day | ORAL | Status: DC
Start: 2014-05-11 — End: 2014-06-30
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
## 2014-05-11 NOTE — Discharge Instructions (Signed)
Inpatient Rehab Discharge Instructions  [name] Discharge date and time: No discharge date for patient encounter.   Activities/Precautions/ Functional Status: Activity: activity as tolerated Diet: Tube feeds as directed/H2O twice daily by tube Wound Care: none needed Functional status:  ___ No restrictions     ___ Walk up steps independently _x__ 24/7 supervision/assistance   ___ Walk up steps with assistance ___ Intermittent supervision/assistance  ___ Bathe/dress independently ___ Walk with walker     ___ Bathe/dress with assistance ___ Walk Independently    ___ Shower independently ___ Walk with assistance    ___ Shower with assistance ___ No alcohol     ___ Return to work/school ________     COMMUNITY REFERRALS UPON DISCHARGE:    Home Health:   PT     OT     ST    RN                     Agency: [facility] Phone: [phone]   Medical Equipment/Items Ordered: hospital bed, hoyer lift, suctioning equipment, tube feedings                                                     Agency/Supplier: [place] @ [phone]   GENERAL COMMUNITY RESOURCES FOR PATIENT/FAMILY:  Support Groups: [facility] [employer] [facility] ([place] or [place] locations)    Caregiver Support: same  [employer] [place] ( http://cook-fox.com/ )                                             www.brainline.org     Special Instructions:    My questions have been answered and I understand these instructions. I will adhere to these goals and the provided educational materials after my discharge from the hospital.  Patient/Caregiver Signature _______________________________ Date __________  Clinician Signature _______________________________________ Date __________  Please bring this form and your medication list with you to all your follow-up doctor's appointments.

## 2014-05-11 NOTE — Progress Notes (Signed)
Social Work  Discharge Note  The overall goal for the admission was met for:   Discharge location: Yes - home with mother as primary caregiver. Shared with father and other family.  Length of Stay: Yes - 16 days  Discharge activity level: Yes - total care  Home/community participation: Yes  Services provided included: MD, RD, PT, OT, SLP, RN, TR, Pharmacy and SW  Financial Services: Medicaid  Follow-up services arranged: Home Health: RN, PT, OT, ST via [facility], DME: hospital bed with alternating pressure pad, hoyer lift, suctioning machine, tube feedings all via [facility] and Patient/Family has no preference for HH/DME agencies  Comments (or additional information): Also referred to CAP program and to [facility] Aide program.  Provided information on [facility], [facility], and local support groups.  Patient/Family verbalized understanding of follow-up arrangements: Yes  Individual responsible for coordination of the follow-up plan: mother  Confirmed correct DME delivered: Lennart Pall 05/11/2014    Lennart Pall

## 2014-05-11 NOTE — Discharge Summary (Signed)
Patient transported via EMS to home. Mother given homegoing instructions by PA. This nurse reviewed teaching with PEG tube medication administration and care via return demonstration by patient's mother. Stable for transport. Tonita Cong, RN

## 2014-05-18 DIAGNOSIS — L89009 Pressure ulcer of unspecified elbow, unspecified stage: Secondary | ICD-10-CM

## 2014-05-18 DIAGNOSIS — R1310 Dysphagia, unspecified: Secondary | ICD-10-CM

## 2014-05-18 DIAGNOSIS — Z4801 Encounter for change or removal of surgical wound dressing: Secondary | ICD-10-CM

## 2014-05-18 DIAGNOSIS — S069X9A Unspecified intracranial injury with loss of consciousness of unspecified duration, initial encounter: Secondary | ICD-10-CM | POA: Diagnosis not present

## 2014-05-18 DIAGNOSIS — S069X9S Unspecified intracranial injury with loss of consciousness of unspecified duration, sequela: Secondary | ICD-10-CM | POA: Diagnosis not present

## 2014-05-18 DIAGNOSIS — L8992 Pressure ulcer of unspecified site, stage 2: Secondary | ICD-10-CM

## 2014-05-18 DIAGNOSIS — IMO0002 Reserved for concepts with insufficient information to code with codable children: Secondary | ICD-10-CM | POA: Diagnosis not present

## 2014-05-18 DIAGNOSIS — IMO0001 Reserved for inherently not codable concepts without codable children: Secondary | ICD-10-CM | POA: Diagnosis not present

## 2014-05-18 DIAGNOSIS — R4701 Aphasia: Secondary | ICD-10-CM

## 2014-05-18 DIAGNOSIS — R471 Dysarthria and anarthria: Secondary | ICD-10-CM

## 2014-05-18 DIAGNOSIS — S069XAS Unspecified intracranial injury with loss of consciousness status unknown, sequela: Secondary | ICD-10-CM | POA: Diagnosis not present

## 2014-05-18 DIAGNOSIS — Z431 Encounter for attention to gastrostomy: Secondary | ICD-10-CM

## 2014-05-23 ENCOUNTER — Telehealth: Payer: Self-pay

## 2014-05-23 NOTE — Telephone Encounter (Signed)
Debbie a physical therapist with [facility] called to inform us patient has a small bruise from wearing the splint, so they have removed it for a little while.  Also they would like to know the directions for the ted hose?  If they should get knee highs, how often and long should they be worn?

## 2014-05-23 NOTE — Telephone Encounter (Signed)
Brace can be removed PRN for bruising, swelling, etc. TED probably would be more beneficial as thigh high. Would don in the morning and doff in the evening.

## 2014-05-23 NOTE — Telephone Encounter (Signed)
Contacted Debbie @ [facility] to inform her that per Dr. [name] patient may remove brace PRN. Patient also needs thigh high TED hose to put on in the morning and off at night.

## 2014-05-27 ENCOUNTER — Telehealth: Payer: Self-pay

## 2014-05-27 DIAGNOSIS — S069X7S Unspecified intracranial injury with loss of consciousness of any duration with death due to brain injury prior to regaining consciousness, sequela: Secondary | ICD-10-CM

## 2014-05-27 DIAGNOSIS — S52202S Unspecified fracture of shaft of left ulna, sequela: Secondary | ICD-10-CM

## 2014-05-27 DIAGNOSIS — S52302S Unspecified fracture of shaft of left radius, sequela: Secondary | ICD-10-CM

## 2014-05-27 NOTE — Telephone Encounter (Signed)
Contacted [facility] to inform them the order was ready to be faxed. Faxed order for sling to [phone].

## 2014-05-27 NOTE — Telephone Encounter (Signed)
done

## 2014-05-27 NOTE — Telephone Encounter (Signed)
Kat (OT @ [facility]) is requesting a order for patient to receive a left shoulder subluxation sling. Please advise.

## 2014-05-30 ENCOUNTER — Telehealth: Payer: Self-pay

## 2014-05-30 DIAGNOSIS — S069X5S Unspecified intracranial injury with loss of consciousness greater than 24 hours with return to pre-existing conscious level, sequela: Secondary | ICD-10-CM

## 2014-05-30 NOTE — Telephone Encounter (Signed)
Patient mother called and is requesting out patient therapies since home health will be ending.

## 2014-05-30 NOTE — Telephone Encounter (Signed)
Orders sent to [facility]

## 2014-05-30 NOTE — Telephone Encounter (Signed)
Contacted patient's mother to inform her that Dr. [name] ordered PT, OT, and Speech at [facility] for the patient.

## 2014-06-07 ENCOUNTER — Other Ambulatory Visit: Payer: Self-pay | Admitting: Physical Medicine & Rehabilitation

## 2014-06-07 MED ORDER — LORAZEPAM 0.5 MG PO TABS: 0.5000 mg | ORAL_TABLET | Freq: Four times a day (QID) | ORAL | Status: DC | PRN

## 2014-06-07 MED ORDER — TRAMADOL HCL 50 MG PO TABS: 50.0000 mg | ORAL_TABLET | Freq: Four times a day (QID) | ORAL | Status: DC | PRN

## 2014-06-07 NOTE — Telephone Encounter (Signed)
Fax request from [facility] to refill Tramadol 50 mg 1 q 6 hours per feeding tube prn moderate pain #90 and Ativan 0.5 mg 1 tablet per feeding tube q 6 hours as needed for anxiety #60. Refill for each phoned in to pharmacist. Seroquel refilled x 1 electronically.

## 2014-06-08 ENCOUNTER — Telehealth: Payer: Self-pay | Admitting: *Deleted

## 2014-06-08 NOTE — Telephone Encounter (Signed)
[name], mother of [name], called to report some medications need authorization with the pharmacy for refills.  She also said that She was told the notes from the hospital did not specify she needed a wheelchair and it will need to be updated to reflect the need for the wheelchair with transport locks in order for the wheelchair to be supplied.  Also she has a flaccid arm and she is requesting and order for pads to prevent breakdowns on this arm. She asked for appt date as well.  [name] has an appt with [name] on 07/05/14 and there are no earlier appts available.  She will probably have to wait about the order for the wheelchair for the assessment to be done during the visit. Dr [name] out of office and will address when he returns.  I left a message for [name] to return my call about the medications needing refills.

## 2014-06-09 NOTE — Telephone Encounter (Signed)
Patient's mother returned call. She would like a call back.

## 2014-06-09 NOTE — Telephone Encounter (Signed)
Mrs [name] said that they are having all kinds of problems.She did receive the med refills.  Due to new medicaid regulations they were only allowed 10 therapy sessions which have been done.  They were not covered for any of the home health assistance and SSI will not take affect until September and cannot apply for CAP for many months after that.  She is basically on her own in [place] care.  The care giver in the home cannot be paid as a care giver now. Medicaid rules they must be no closer relative above a neice.  The wheel chair issue --being built for them through [employer] and the notes from last visit in hospital did not specify the stats on why and what she needed. The person building knows what she needs but the notes didn't cover it for medicaid to pay.  I asked her to have the info needed faxed to our office.  The wheelchair also has to have an order for "lock downs" so that she can be transported via ?[facility] I believe.  Otherwise she has to go by EMS.  Unfortunately if she does not have the wheelchair with the lock downs by the time for the 07/05/14 appt, she will have to be transported here by EMS and it is $150 each way.  She understands Dr [name] is out of office until 06/13/14 and will review this at that time.

## 2014-06-13 NOTE — Telephone Encounter (Signed)
I need the appropriate documentation forwarded to me so that I can review or sign. Also, I can write for whatever is needed in the chart to justify her wheel chair needs. Have they contacted Lennart Pall at the hospital? I thought they went through everything in depth while on inpatient rehab?

## 2014-06-13 NOTE — Telephone Encounter (Signed)
Notified [name] that she should have the rep working on the wheelchair fax a request for the information needed form Dr [name] to get the wheelchair paid for. He will write a note when he gets the information.  Also I suggested she touch base again with Lennart Pall to see if there is anything else that can be done to get some assistance.  Dr [name] was under the impression everything had been deat with while in inpt rehab. I gave her our fax number for the wheelchair rep.

## 2014-06-20 ENCOUNTER — Telehealth: Payer: Self-pay | Admitting: *Deleted

## 2014-06-20 DIAGNOSIS — S069XAS Unspecified intracranial injury with loss of consciousness status unknown, sequela: Secondary | ICD-10-CM

## 2014-06-20 DIAGNOSIS — S069X9S Unspecified intracranial injury with loss of consciousness of unspecified duration, sequela: Secondary | ICD-10-CM

## 2014-06-20 NOTE — Telephone Encounter (Signed)
Left message about needing a HHRN to come out and check on Alveena's feeding tube and a SW consult to help set up transport to appointments.  I have made the referral per Dr [name].  I also spoke with Lennart Pall MSW about the situation and all arrangements were made at discharge but the situation is related to the length of time since the injury and may affect what services can be obtained.  Most will have to go through the department of social services and she has been given that information.  [name] keeps talking about a DMA form 4.  I have no knowledge of this form and if it is something that needs to be filled out by the MD then the person requesting it will need to fax the form to our office.

## 2014-06-22 ENCOUNTER — Other Ambulatory Visit: Payer: Self-pay | Admitting: Physical Medicine & Rehabilitation

## 2014-06-29 ENCOUNTER — Telehealth: Payer: Self-pay

## 2014-06-29 ENCOUNTER — Other Ambulatory Visit: Payer: Self-pay | Admitting: Physical Medicine & Rehabilitation

## 2014-06-29 MED ORDER — LORAZEPAM 0.5 MG PO TABS: 0.5000 mg | ORAL_TABLET | Freq: Four times a day (QID) | ORAL | Status: DC | PRN

## 2014-06-29 NOTE — Telephone Encounter (Signed)
Lorazepam called into [facility] pharmacy.

## 2014-06-29 NOTE — Telephone Encounter (Signed)
that's fine with an additional refill

## 2014-06-29 NOTE — Telephone Encounter (Signed)
Refill request from [facility] for lorazepam 0.5 mg #60 place 1 tablet into feeding tube every 6 hours as needed for anxiety.  Please advise if refill is appropriate.

## 2014-06-30 ENCOUNTER — Other Ambulatory Visit: Payer: Self-pay | Admitting: Physical Medicine & Rehabilitation

## 2014-07-05 ENCOUNTER — Encounter: Payer: Medicaid Other | Attending: Physical Medicine & Rehabilitation | Admitting: Physical Medicine & Rehabilitation

## 2014-07-05 ENCOUNTER — Encounter: Payer: Self-pay | Admitting: Physical Medicine & Rehabilitation

## 2014-07-05 VITALS — BP 134/74 | HR 76 | Resp 18

## 2014-07-05 DIAGNOSIS — Z87820 Personal history of traumatic brain injury: Secondary | ICD-10-CM | POA: Insufficient documentation

## 2014-07-05 DIAGNOSIS — S52202S Unspecified fracture of shaft of left ulna, sequela: Secondary | ICD-10-CM

## 2014-07-05 DIAGNOSIS — G8110 Spastic hemiplegia affecting unspecified side: Secondary | ICD-10-CM | POA: Insufficient documentation

## 2014-07-05 DIAGNOSIS — G8250 Quadriplegia, unspecified: Secondary | ICD-10-CM | POA: Insufficient documentation

## 2014-07-05 DIAGNOSIS — Z930 Tracheostomy status: Secondary | ICD-10-CM | POA: Insufficient documentation

## 2014-07-05 DIAGNOSIS — Z5189 Encounter for other specified aftercare: Secondary | ICD-10-CM

## 2014-07-05 DIAGNOSIS — Z931 Gastrostomy status: Secondary | ICD-10-CM | POA: Diagnosis not present

## 2014-07-05 DIAGNOSIS — S42352S Displaced comminuted fracture of shaft of humerus, left arm, sequela: Secondary | ICD-10-CM

## 2014-07-05 DIAGNOSIS — Z79899 Other long term (current) drug therapy: Secondary | ICD-10-CM | POA: Insufficient documentation

## 2014-07-05 DIAGNOSIS — S42309S Unspecified fracture of shaft of humerus, unspecified arm, sequela: Secondary | ICD-10-CM

## 2014-07-05 DIAGNOSIS — S52302S Unspecified fracture of shaft of left radius, sequela: Secondary | ICD-10-CM

## 2014-07-05 DIAGNOSIS — S52022S Displaced fracture of olecranon process without intraarticular extension of left ulna, sequela: Secondary | ICD-10-CM

## 2014-07-05 DIAGNOSIS — S069X7D Unspecified intracranial injury with loss of consciousness of any duration with death due to brain injury prior to regaining consciousness, subsequent encounter: Secondary | ICD-10-CM

## 2014-07-05 DIAGNOSIS — S42001S Fracture of unspecified part of right clavicle, sequela: Secondary | ICD-10-CM

## 2014-07-05 MED ORDER — TRAMADOL HCL 50 MG PO TABS: 50.0000 mg | ORAL_TABLET | Freq: Four times a day (QID) | ORAL | Status: DC | PRN

## 2014-07-05 MED ORDER — LORAZEPAM 0.5 MG PO TABS: 0.5000 mg | ORAL_TABLET | Freq: Four times a day (QID) | ORAL | Status: DC | PRN

## 2014-07-05 MED ORDER — CITALOPRAM HYDROBROMIDE 20 MG PO TABS: 20.0000 mg | ORAL_TABLET | Freq: Every day | ORAL | Status: DC

## 2014-07-05 MED ORDER — DANTROLENE SODIUM 50 MG PO CAPS: 50.0000 mg | ORAL_CAPSULE | Freq: Three times a day (TID) | ORAL | Status: DC

## 2014-07-05 NOTE — Progress Notes (Signed)
Subjective:    Patient ID: [name], female    DOB: Jan 26, 1981, 33 y.o.   MRN: [id number]  HPI  [name] is here today in followup of her severe TBI and polytrauma. The main purpose of this visit is to assess her for a wheelchair and address her spasticity and behavior.  Her spasiticity continues to be a major problem. She had great results with botox for the LUE. She has had ongoing issues with the right leg however. Mom is using the KI and ankle sleeve to protect knee rom but she remains limited. The patient does not like when her leg is spasms and then tends to set her off on crying and yelling episodes at home. She continues on medications as prescribed. Her mother has been using ativan almost on a scheduled basis to help control behavior. She uses tramadol also to help control pain.  The patient is in need of a customized wheelchair to help support the trunk,neck and upper limbs, to allow for elevation of the legs and upper extremities if needed, to allow for ROM and repositioning in the chair, to prevent skin breakdown, and reduce pain. It needs to be a chair which her caregivers can handle and maneuver in the home and in the community.   Pain Inventory Average Pain 4 Pain Right Now 0 My pain is cant describe  In the last 24 hours, has pain interfered with the following? General activity 4 Relation with others 4 Enjoyment of life 6 What TIME of day is your pain at its worst? evening Sleep (in general) Fair  Pain is worse with: inactivity and some activites Pain improves with: rest, heat/ice, therapy/exercise and medication Relief from Meds: 8  Mobility use a wheelchair needs help with transfers Do you have any goals in this area?  yes  Function disabled: date disabled 12/2013 I need assistance with the following:  feeding, dressing, bathing, toileting, meal prep, household duties and shopping Do you have any goals in this area?  yes  Neuro/Psych bowel control  problems numbness spasms anxiety  Prior Studies Any changes since last visit?  no  Physicians involved in your care Any changes since last visit?  no   History reviewed. No pertinent family history. History   Social History  . Marital Status: Divorced    Spouse Name: N/A    Number of Children: N/A  . Years of Education: N/A   Social History Main Topics  . Smoking status: Former Smoker -- 0.25 packs/day for 16 years    Types: Cigarettes    Quit date: 01/04/2014  . Smokeless tobacco: None  . Alcohol Use: Yes     Comment: occasional drinker  . Drug Use: No  . Sexual Activity: None   Other Topics Concern  . None   Social History Narrative  . None   Past Surgical History  Procedure Laterality Date  . Abdominal hysterectomy    . Orif ulnar fracture Left 01/04/2014    Procedure: OPEN REDUCTION INTERNAL FIXATION (ORIF) ULNAR FRACTURE;  Surgeon: Alta Corning, MD;  Location: [facility];  Service: Orthopedics;  Laterality: Left;  . Orif humerus fracture Left 01/04/2014    Procedure: OPEN REDUCTION INTERNAL FIXATION (ORIF) HUMERAL SHAFT FRACTURE;  Surgeon: Alta Corning, MD;  Location: [facility];  Service: Orthopedics;  Laterality: Left;  . Orif radial fracture Left 01/04/2014    Procedure: OPEN REDUCTION INTERNAL FIXATION (ORIF) RADIAL FRACTURE;  Surgeon: Alta Corning, MD;  Location: [facility];  Service: Orthopedics;  Laterality: Left;  . Orif elbow fracture Left 01/04/2014    Procedure: OPEN REDUCTION INTERNAL FIXATION (ORIF) ELBOW/OLECRANON FRACTURE;  Surgeon: Alta Corning, MD;  Location: [facility];  Service: Orthopedics;  Laterality: Left;  . Peg placement N/A 01/12/2014    Procedure: PERCUTANEOUS ENDOSCOPIC GASTROSTOMY (PEG) PLACEMENT;  Surgeon: Gwenyth Ober, MD;  Location: [facility];  Service: General;  Laterality: N/A;    bedside trach and peg  . Percutaneous tracheostomy N/A 01/12/2014    Procedure: PERCUTANEOUS TRACHEOSTOMY;  Surgeon: Gwenyth Ober, MD;  Location: [facility];  Service:  General;  Laterality: N/A;  BEDSIDE TRACH   History reviewed. No pertinent past medical history. BP 134/74  Pulse 76  Resp 18  SpO2 94%  Opioid Risk Score:   Fall Risk Score: Low Fall Risk (0-5 points) (patient educated handout given)   Review of Systems  Gastrointestinal: Positive for constipation.  Neurological: Positive for numbness.  Psychiatric/Behavioral: The patient is nervous/anxious.   All other systems reviewed and are negative.      Objective:   Physical Exam Pt with severe spastic right hemiparesis with hamstrings and gastroc, soleus, and Tib posterior most affected. Pt withdraws into primitive patterns. It's hard to demonstrate volitional movement consistently in the RLE. LUE continues to better after botox with left elbow fairly rangeable into extension.        Assessment & Plan:  1. This patient requires a customized wheelchair for the reasons mentioned above. Mother will contact vendor for further information. I am happy to help complete any portions of prescription she may need. 2. This patient also requires a personal care attendant at all times. Family is looking for help through personal care services to assist. 3. Recommend further PT, OT, and SLP to address motor, neuromuscular, behavior, and cognitive/speech/language/swallowing issues related to this severe injury. A home health program wouuld be most appropriate at this point 4. 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 This includes bleeding, bruising, infection, excessive weakness, or medication side effects. A REMS form is on file and  signed.  Needle: 63mm injectable monopolar needle electrode    Hamstrings 300 units in six separate access points Tibialis Posterior 75 units, 2 access points Gastroc/soleus: 125 units, 4 access points   All injections were done after obtaining appropriate EMG activity and after negative drawback for blood. The patient tolerated the procedure well. Post procedure instructions were given. A followup appointment was made.   5. I increased dantrium to 50mg  tid 6. Added celexa to assist with anxiety and depression 7. Refilled ultram and ativan today---may need to increase tegretol---will follow for now  THIS PATIENT NEEDS SUBSTANTIAL ASSISTANCE DUE TO HER SEVERE INJURIES. I SUPPORT ANY TYPE OF ADDITIONAL CARE THE FAMILY CAN FIND.   Follow up in 2 months. 45 minutes of face to face patient care time were spent during this visit. All questions were encouraged and answered.

## 2014-07-05 NOTE — Patient Instructions (Addendum)
PLEASE CALL ME WITH ANY PROBLEMS OR QUESTIONS (#[phone]).     HAVE SOCIAL SERVICES, HOME HEALTH, OR WHEELCHAIR VENDOR CONTACT ME WITH ANY QUESTIONS OR NEEDS. I HAVE DOCUMENTED TODAY'S NOTE TO REFLECT [name]'S NEEDS FOR A WHEELCHAIR.   I AM MORE THAN HAPPY TO HELP IN ANYWAY I CAN.

## 2014-07-09 ENCOUNTER — Other Ambulatory Visit: Payer: Self-pay | Admitting: Physical Medicine & Rehabilitation

## 2014-07-11 ENCOUNTER — Other Ambulatory Visit: Payer: Self-pay | Admitting: Physical Medicine & Rehabilitation

## 2014-07-12 DIAGNOSIS — S069XAS Unspecified intracranial injury with loss of consciousness status unknown, sequela: Secondary | ICD-10-CM | POA: Diagnosis not present

## 2014-07-12 DIAGNOSIS — L8992 Pressure ulcer of unspecified site, stage 2: Secondary | ICD-10-CM

## 2014-07-12 DIAGNOSIS — Z431 Encounter for attention to gastrostomy: Secondary | ICD-10-CM

## 2014-07-12 DIAGNOSIS — IMO0001 Reserved for inherently not codable concepts without codable children: Secondary | ICD-10-CM | POA: Diagnosis not present

## 2014-07-12 DIAGNOSIS — L89009 Pressure ulcer of unspecified elbow, unspecified stage: Secondary | ICD-10-CM

## 2014-07-12 DIAGNOSIS — S069X9S Unspecified intracranial injury with loss of consciousness of unspecified duration, sequela: Secondary | ICD-10-CM | POA: Diagnosis not present

## 2014-07-12 DIAGNOSIS — R1310 Dysphagia, unspecified: Secondary | ICD-10-CM

## 2014-07-12 DIAGNOSIS — S069X9A Unspecified intracranial injury with loss of consciousness of unspecified duration, initial encounter: Secondary | ICD-10-CM | POA: Diagnosis not present

## 2014-07-12 DIAGNOSIS — Z4801 Encounter for change or removal of surgical wound dressing: Secondary | ICD-10-CM

## 2014-07-12 DIAGNOSIS — R4701 Aphasia: Secondary | ICD-10-CM

## 2014-07-12 DIAGNOSIS — IMO0002 Reserved for concepts with insufficient information to code with codable children: Secondary | ICD-10-CM | POA: Diagnosis not present

## 2014-07-12 DIAGNOSIS — R471 Dysarthria and anarthria: Secondary | ICD-10-CM

## 2014-07-14 ENCOUNTER — Telehealth: Payer: Self-pay

## 2014-07-14 NOTE — Telephone Encounter (Signed)
Notified her mother.  She says the Botox has helped noticeably every day, though not as much as for the arm.Marland Kitchen

## 2014-07-14 NOTE — Telephone Encounter (Signed)
Continue celexa. Reduce dantrium to 50mg  at bedtime only. i suspect her sx are largely from increased dantrium.  Has botox helped her right leg?

## 2014-07-14 NOTE — Telephone Encounter (Signed)
Patient's mother called on her behalf. She states that since patient has been taking the muscle relaxer and anti-depressant she is lethargic, and does not sleep peacefully. Mother is requesting a recommendation.

## 2014-07-28 ENCOUNTER — Other Ambulatory Visit: Payer: Self-pay | Admitting: *Deleted

## 2014-07-28 ENCOUNTER — Other Ambulatory Visit: Payer: Self-pay | Admitting: Physical Medicine & Rehabilitation

## 2014-07-28 MED ORDER — QUETIAPINE FUMARATE 50 MG PO TABS: ORAL_TABLET | ORAL | Status: DC

## 2014-08-19 ENCOUNTER — Other Ambulatory Visit: Payer: Self-pay | Admitting: Physical Medicine & Rehabilitation

## 2014-08-19 ENCOUNTER — Other Ambulatory Visit: Payer: Self-pay

## 2014-08-19 DIAGNOSIS — S52022S Displaced fracture of olecranon process without intraarticular extension of left ulna, sequela: Secondary | ICD-10-CM

## 2014-08-19 DIAGNOSIS — S42352S Displaced comminuted fracture of shaft of humerus, left arm, sequela: Secondary | ICD-10-CM

## 2014-08-19 DIAGNOSIS — S52302S Unspecified fracture of shaft of left radius, sequela: Secondary | ICD-10-CM

## 2014-08-19 DIAGNOSIS — S52202S Unspecified fracture of shaft of left ulna, sequela: Secondary | ICD-10-CM

## 2014-08-19 DIAGNOSIS — S069X7D Unspecified intracranial injury with loss of consciousness of any duration with death due to brain injury prior to regaining consciousness, subsequent encounter: Secondary | ICD-10-CM

## 2014-08-19 DIAGNOSIS — S42001S Fracture of unspecified part of right clavicle, sequela: Secondary | ICD-10-CM

## 2014-08-19 MED ORDER — TRAMADOL HCL 50 MG PO TABS: 50.0000 mg | ORAL_TABLET | Freq: Four times a day (QID) | ORAL | Status: DC | PRN

## 2014-08-28 IMAGING — CR DG CHEST 1V PORT
1 series · 1 of 1 positions shown · non-contrast
Comparison: 01/20/2014

CLINICAL DATA: Bloody secretions.

EXAM:
PORTABLE CHEST - 1 VIEW

[AP]
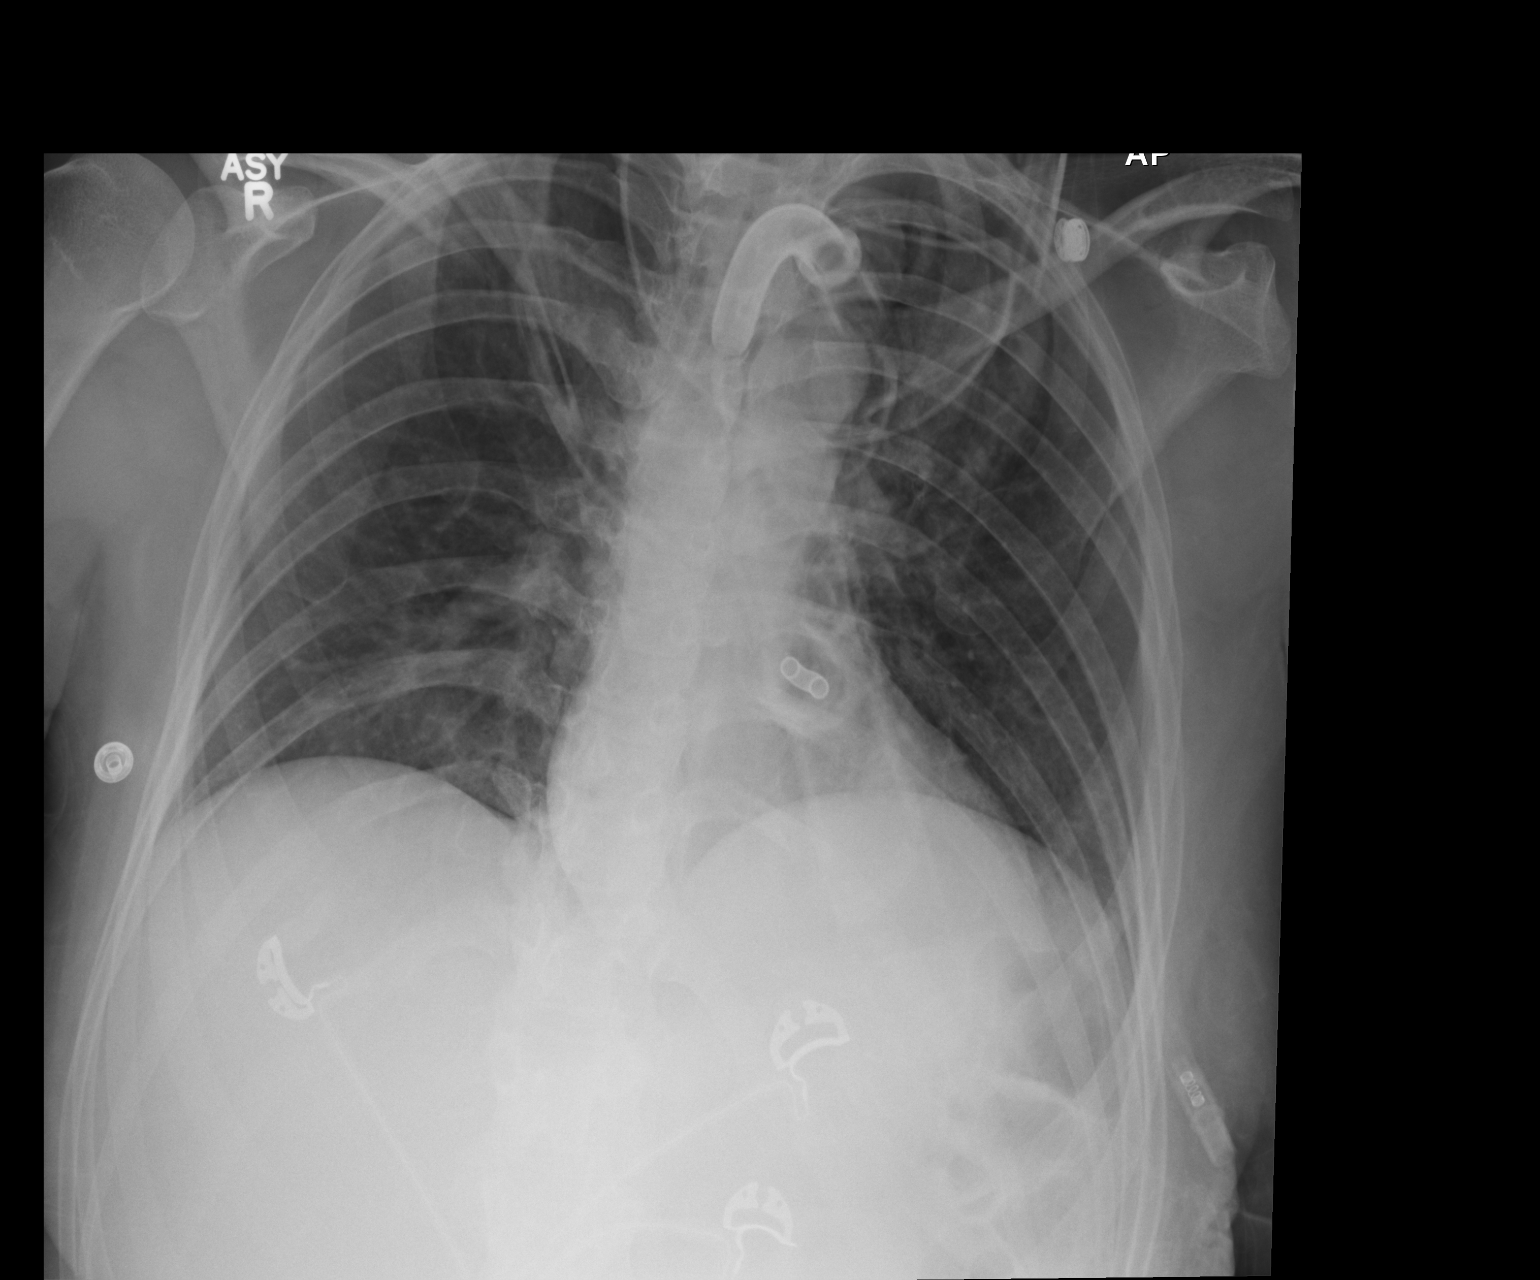

[1 of 1 positions shown; findings below may reference images not displayed]

FINDINGS: Tracheostomy tube unchanged. Lungs are adequately inflated as
patient is slightly rotated to the left. There is minimal hazy
density over the right base likely vascular crowding although cannot
exclude developing infection or atelectasis. Cardiomediastinal
silhouette and remainder of the exam is unchanged. .
IMPRESSION: Subtle hazy density right base likely vascular crowding although
cannot exclude developing infection or atelectasis.

Tracheostomy tube unchanged.

## 2014-08-31 ENCOUNTER — Other Ambulatory Visit: Payer: Self-pay | Admitting: Physical Medicine & Rehabilitation

## 2014-08-31 IMAGING — CR DG ELBOW 2V*L*
1 series · 1 of 1 positions shown · non-contrast
Comparison: 01/07/2014

CLINICAL DATA: Multiple trauma.

EXAM:
LEFT ELBOW - 2 VIEW

[AP]
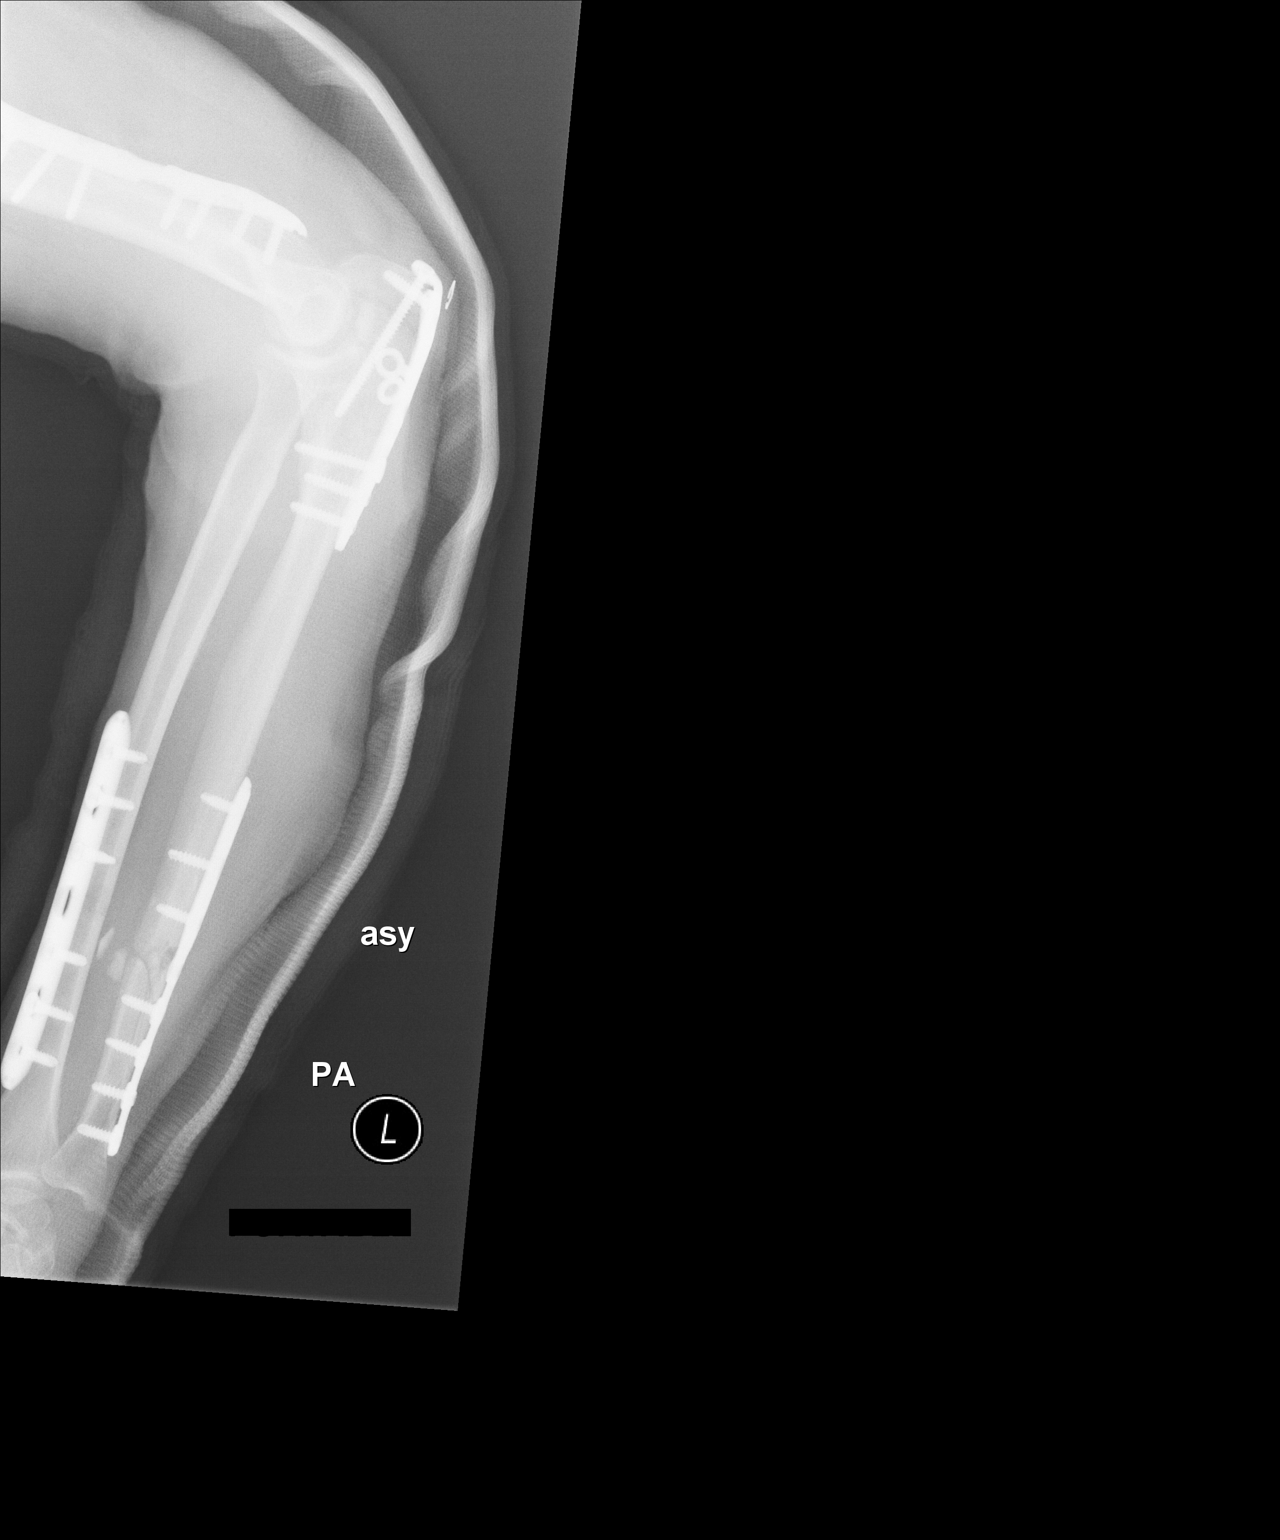

[1 of 1 positions shown; findings below may reference images not displayed]

FINDINGS: Extensive hardware transfixing both bone forearm fractures distally
and proximal ulnar fracture. Hardware appears to be in good position
without complicating features. Near anatomic reductions.
IMPRESSION: Status post internal fixation of forearm and elbow fractures without
complicating features.

## 2014-09-09 ENCOUNTER — Other Ambulatory Visit: Payer: Self-pay | Admitting: Physical Medicine & Rehabilitation

## 2014-09-13 ENCOUNTER — Other Ambulatory Visit: Payer: Self-pay | Admitting: *Deleted

## 2014-09-13 DIAGNOSIS — S069X7D Unspecified intracranial injury with loss of consciousness of any duration with death due to brain injury prior to regaining consciousness, subsequent encounter: Secondary | ICD-10-CM

## 2014-09-13 DIAGNOSIS — S42352S Displaced comminuted fracture of shaft of humerus, left arm, sequela: Secondary | ICD-10-CM

## 2014-09-13 DIAGNOSIS — S52302S Unspecified fracture of shaft of left radius, sequela: Secondary | ICD-10-CM

## 2014-09-13 DIAGNOSIS — S52022S Displaced fracture of olecranon process without intraarticular extension of left ulna, sequela: Secondary | ICD-10-CM

## 2014-09-13 DIAGNOSIS — S52202S Unspecified fracture of shaft of left ulna, sequela: Secondary | ICD-10-CM

## 2014-09-13 DIAGNOSIS — S42001S Fracture of unspecified part of right clavicle, sequela: Secondary | ICD-10-CM

## 2014-09-13 MED ORDER — LORAZEPAM 0.5 MG PO TABS: 0.5000 mg | ORAL_TABLET | Freq: Four times a day (QID) | ORAL | Status: DC | PRN

## 2014-09-13 NOTE — Telephone Encounter (Signed)
[facility] faxed RX request for Lorazepam 0.5 mg tablets.  #90 TID as needed for anxiety

## 2014-09-17 ENCOUNTER — Other Ambulatory Visit: Payer: Self-pay | Admitting: Physical Medicine & Rehabilitation

## 2014-09-23 ENCOUNTER — Telehealth: Payer: Self-pay | Admitting: Physical Medicine & Rehabilitation

## 2014-09-23 NOTE — Telephone Encounter (Signed)
Protonix helps feeding tube; 4/5 days nausea-happens anytime

## 2014-09-24 NOTE — Telephone Encounter (Signed)
Is she asking for a refill?  i am a little confused by the message.  If so,  She may have a refill x 4

## 2014-09-26 ENCOUNTER — Other Ambulatory Visit: Payer: Self-pay | Admitting: *Deleted

## 2014-09-26 DIAGNOSIS — R1310 Dysphagia, unspecified: Secondary | ICD-10-CM

## 2014-09-26 IMAGING — CT CT ABD-PELV W/O CM
2 of 4 series · 16 of 46 positions shown, 18 images · non-contrast
Comparison: CT abdomen pelvis of 01/04/2014

CLINICAL DATA: History of closed head injury, unable to
communicate, evaluate PEG tube

EXAM:
CT ABDOMEN AND PELVIS WITHOUT CONTRAST
TECHNIQUE: Multidetector CT imaging of the abdomen and pelvis was performed
following the standard protocol without intravenous contrast.

[Series 2: routine abd pel without · axial · non-contrast · 0.70mm/px · z∈[-1086,-622]mm · 13 of 101 slices shown, 15 images]
[im 4/101  soft-tissue]
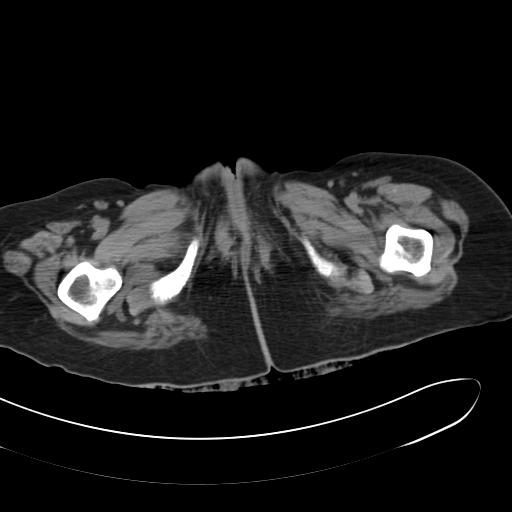
[im 4/101  bone]
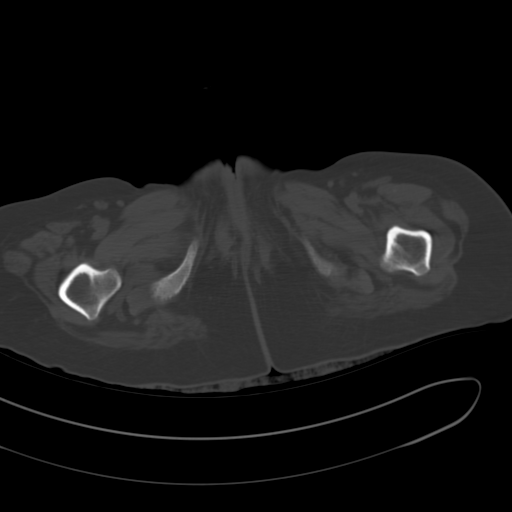
[im 12/101  soft-tissue]
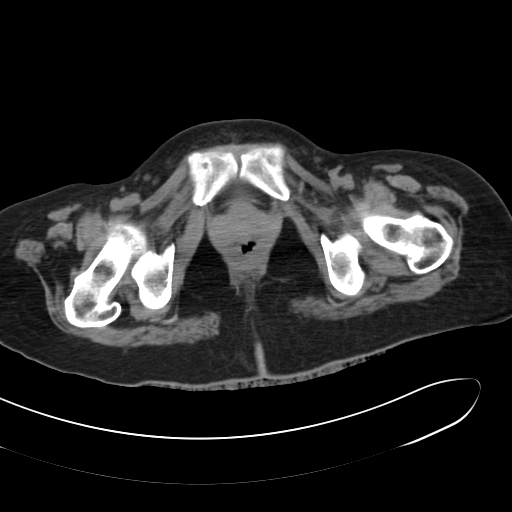
[im 20/101  soft-tissue]
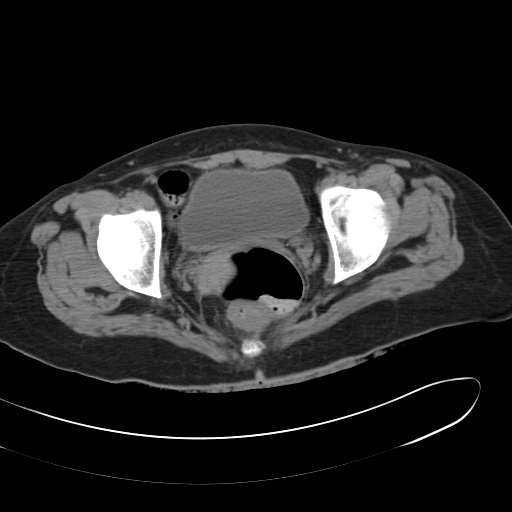
[im 27/101  soft-tissue]
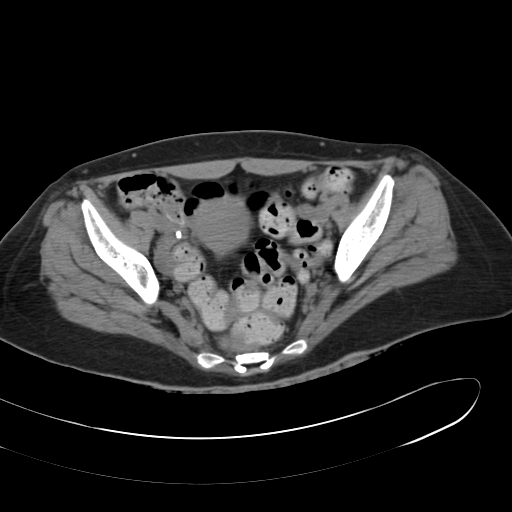
[im 35/101  soft-tissue]
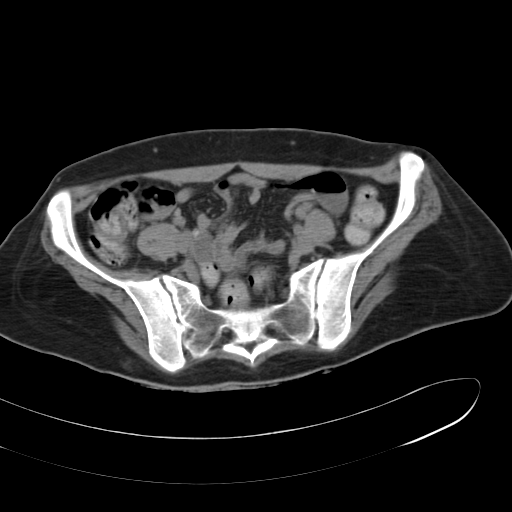
[im 43/101  soft-tissue]
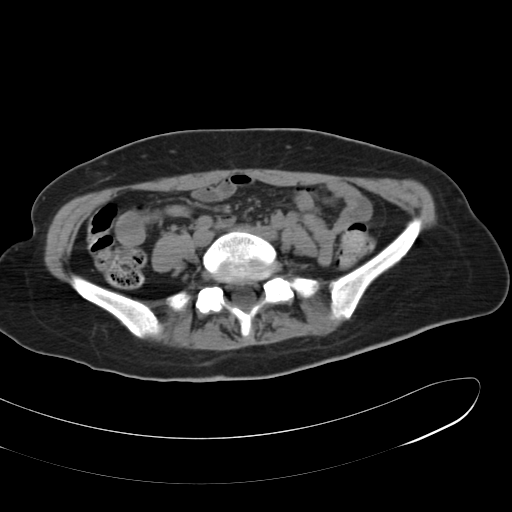
[im 51/101  soft-tissue]
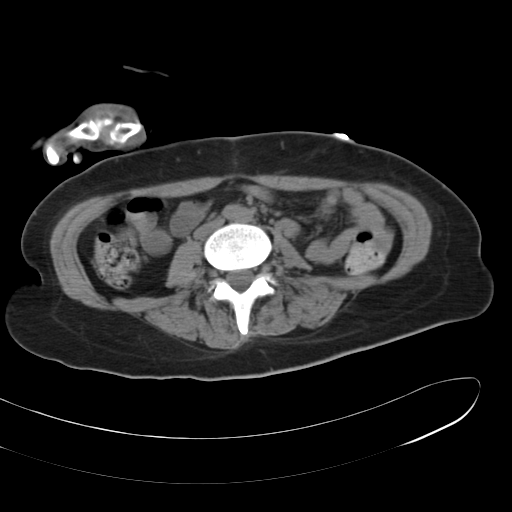
[im 58/101  soft-tissue]
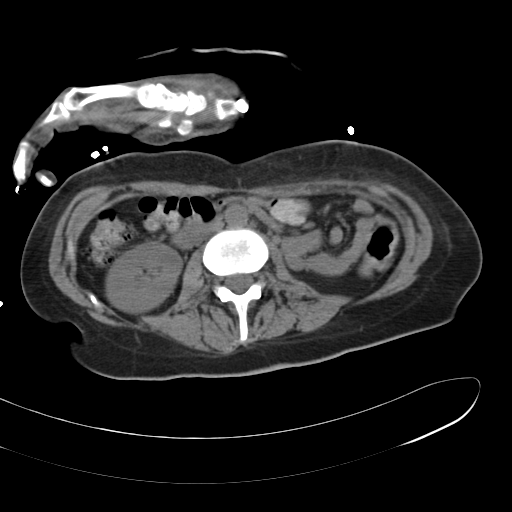
[im 66/101  soft-tissue]
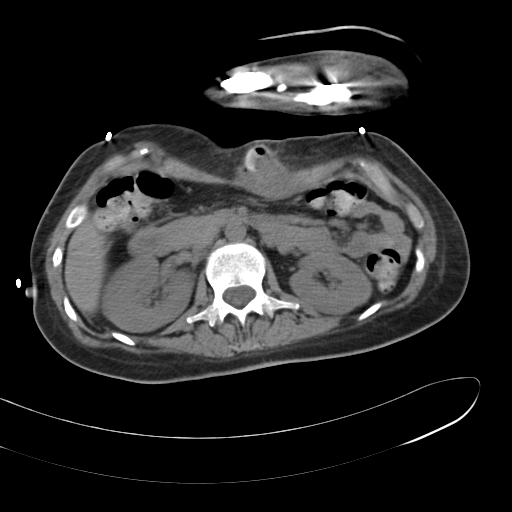
[im 66/101  bone]
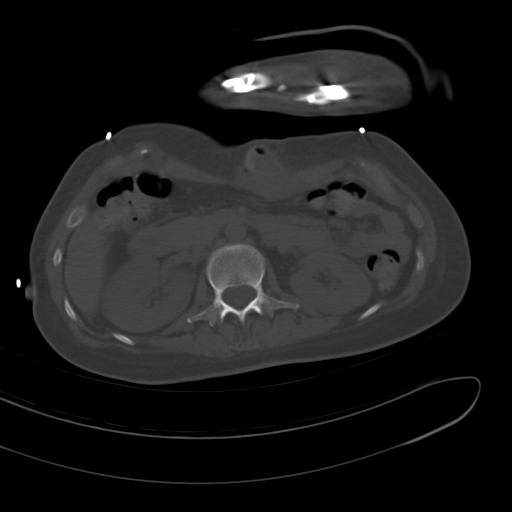
[im 74/101  soft-tissue]
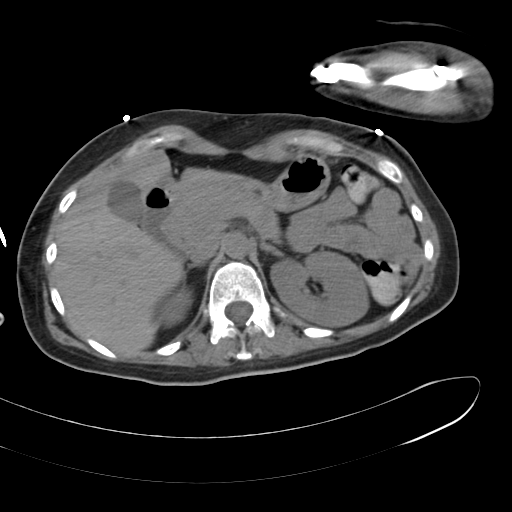
[im 81/101  soft-tissue]
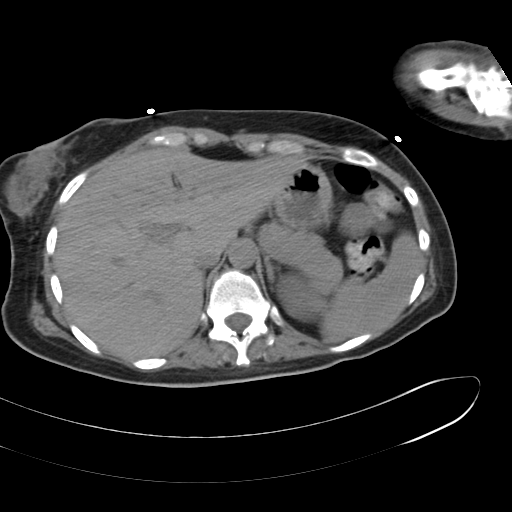
[im 89/101  soft-tissue]
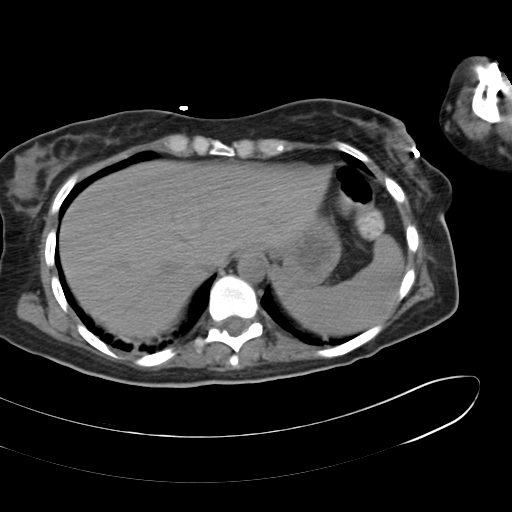
[im 97/101  soft-tissue]
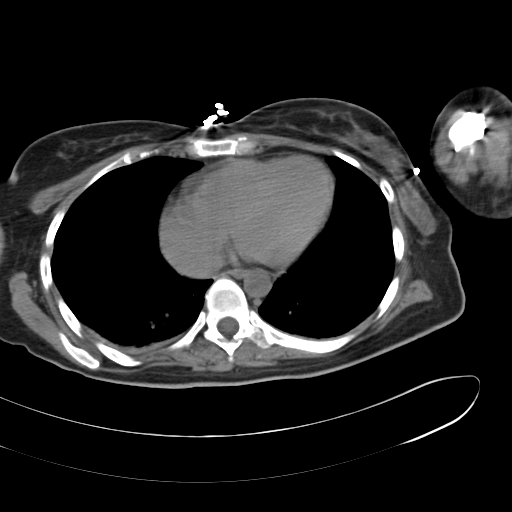

[Series 5: cor routine abd pel wo · coronal · 0.73mm/px · 3 of 92 slices shown]
[im 31/92  soft-tissue]
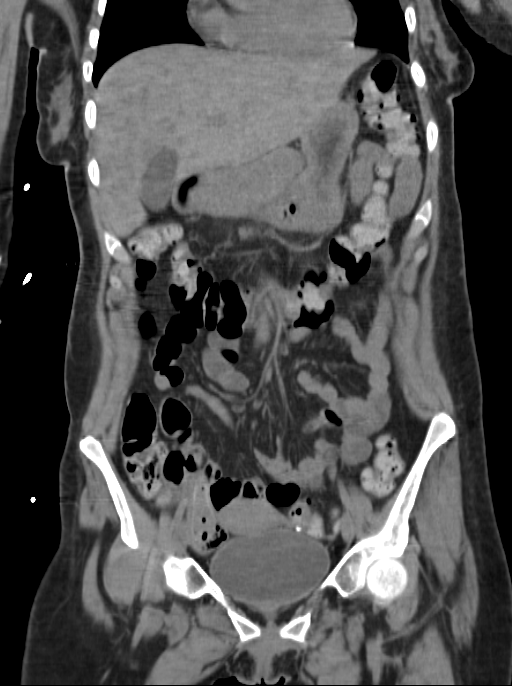
[im 41/92  soft-tissue]
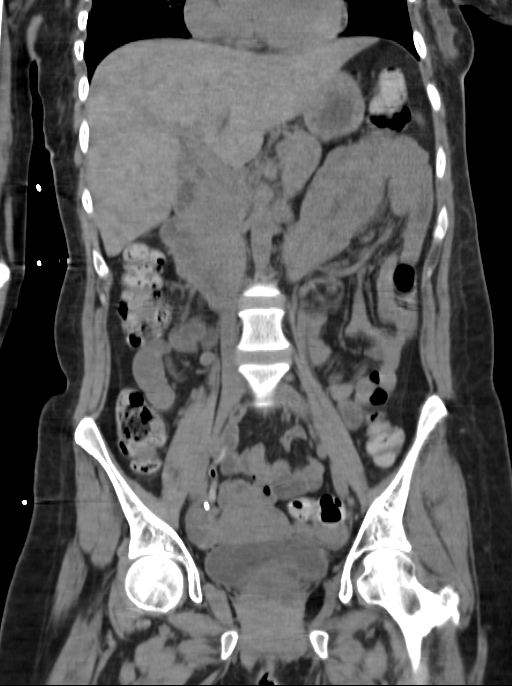
[im 51/92  soft-tissue]
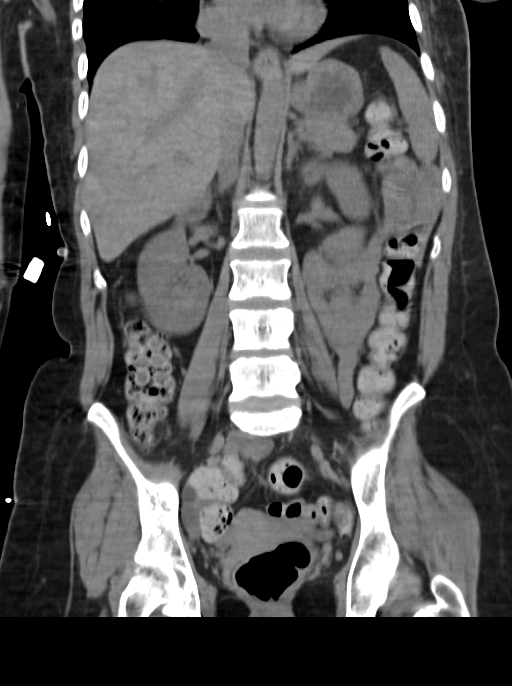

[16 of 46 positions shown; findings below may reference images not displayed]

FINDINGS: Mild linear atelectasis is noted dependently at the lung bases. The
liver is unremarkable in the unenhanced state. No calcified
gallstones are seen. The pancreas is normal in size and the
peripancreatic fat planes are well preserved. The adrenal glands and
spleen are unremarkable. The stomach is not well distended. However
the PEG tube bulb is noted to be within the soft tissues of the
anterior abdomen with probable surrounding edema. No definite
discrete abscess is seen. No renal calculi are noted and there is no
evidence of hydronephrosis. The abdominal aorta is normal in
caliber. No free fluid is seen within the abdomen.

The uterus is normal in size. Small ovarian follicles are present.
The urinary bladder is not well distended but no abnormality is
seen. No free fluid is noted within the pelvis. No abnormality of
the colon is seen. Surgical clip is noted low in the right lower
quadrant. The appendix fills with no abnormality noted. No bony
abnormality is seen.
IMPRESSION: 1. The bulb of the PEG tube is within the soft tissues of the
anterior abdominal wall and not within the stomach, with probable
surrounding edema. No abscess.
2. No free fluid is seen within the abdomen or pelvis.

## 2014-09-26 IMAGING — US US SOFT TISSUE EXCLUDE HEAD/NECK
1 series · 14 of 21 positions shown · non-contrast
Comparison: No priors.

CLINICAL DATA: Redness and swelling adjacent feeding tube.

EXAM:
LIMITED ULTRASOUND OF ABDOMINAL SOFT TISSUES
TECHNIQUE: Ultrasound examination of the abdominal wall soft tissues was
performed in the area of clinical concern.

[Series 1: us soft tissue exclude head/neck · 0.07mm/px · 21 acquisitions, 14 frames shown]
[im 1/21]
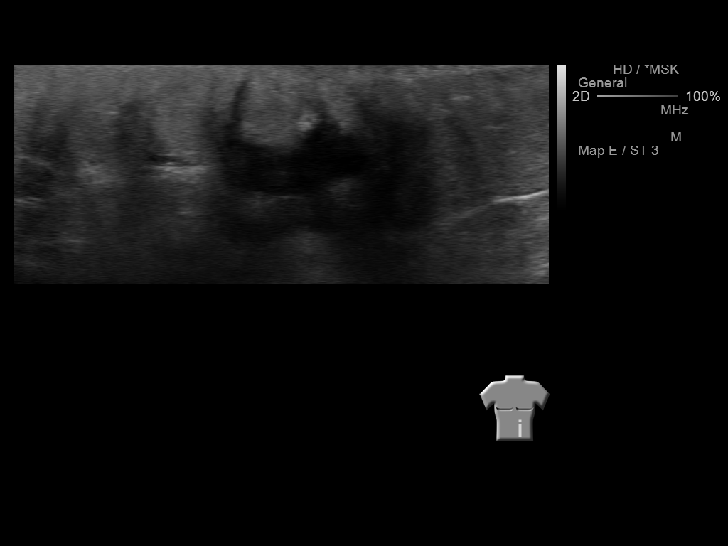
[im 3/21]
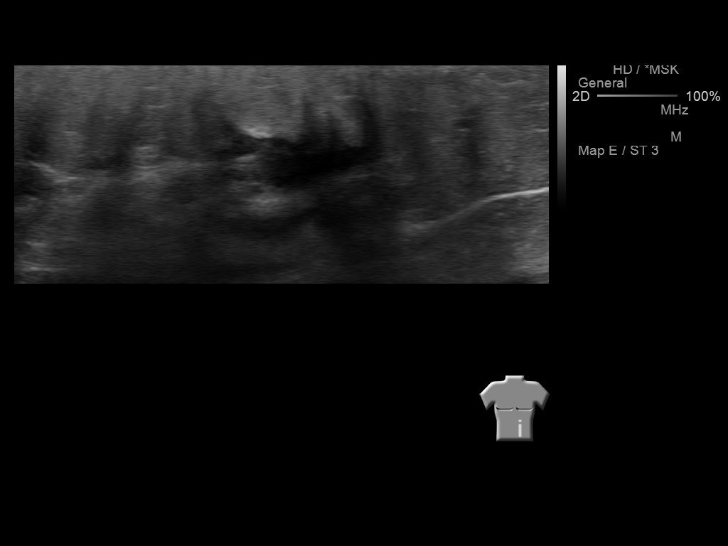
[im 4/21]
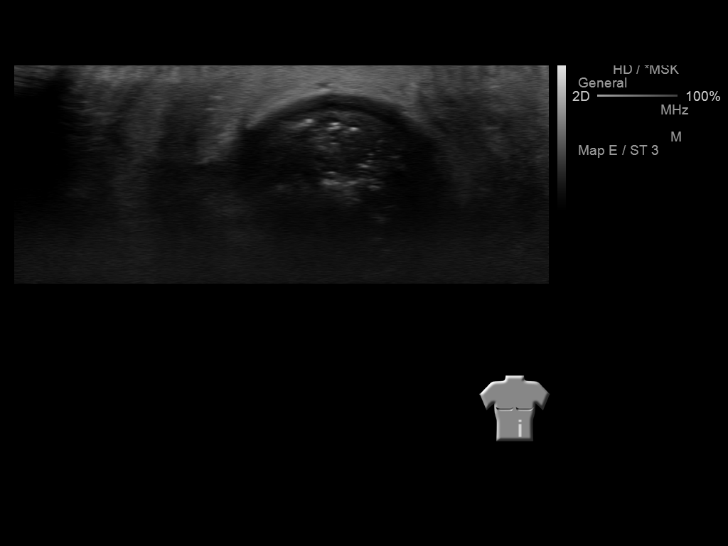
[im 6/21]
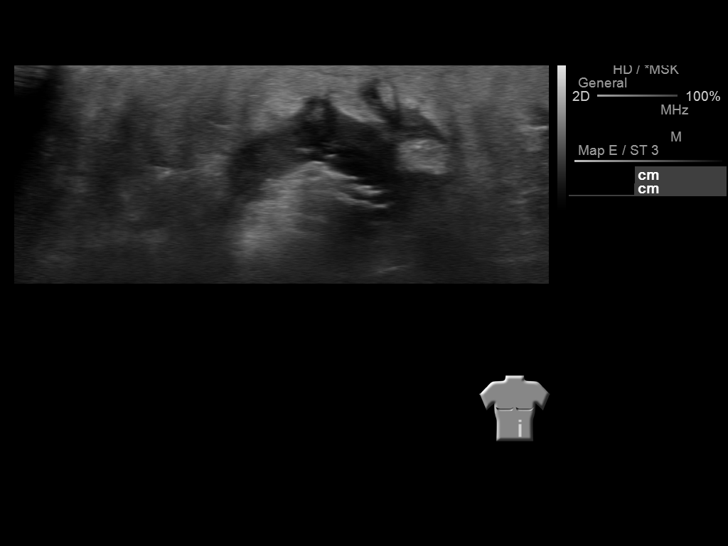
[im 7/21]
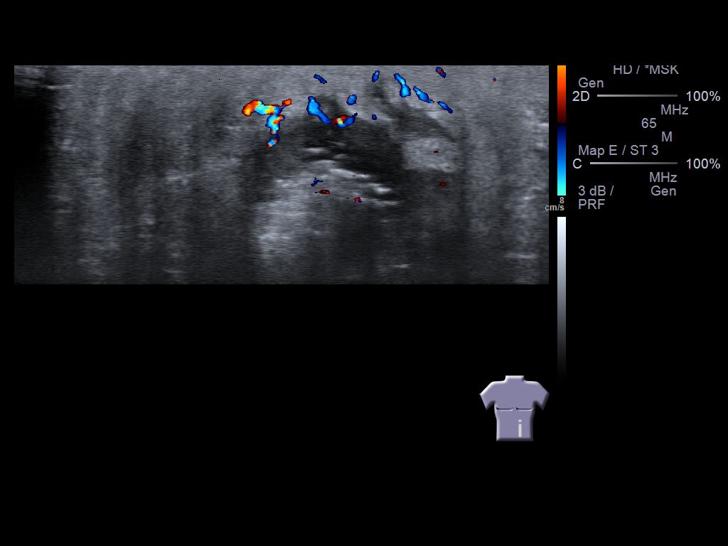
[im 9/21]
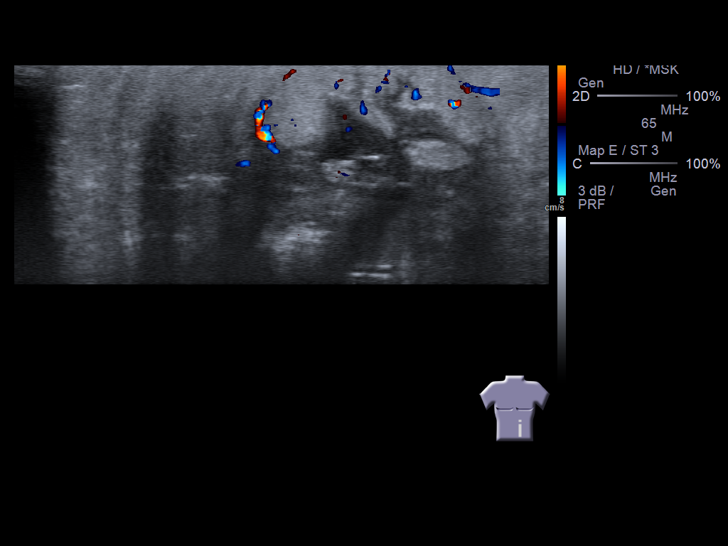
[im 10/21]
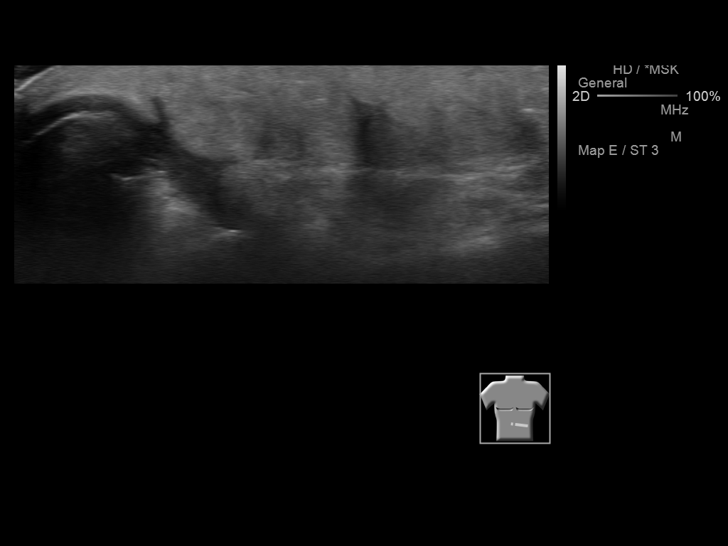
[im 12/21]
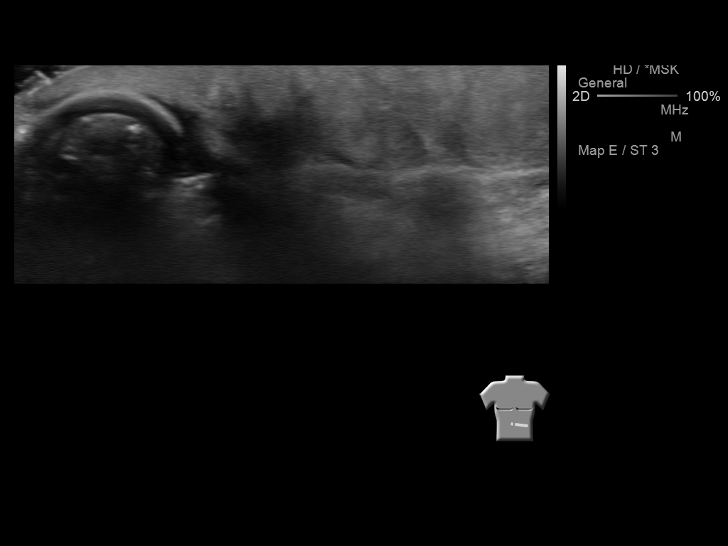
[im 13/21]
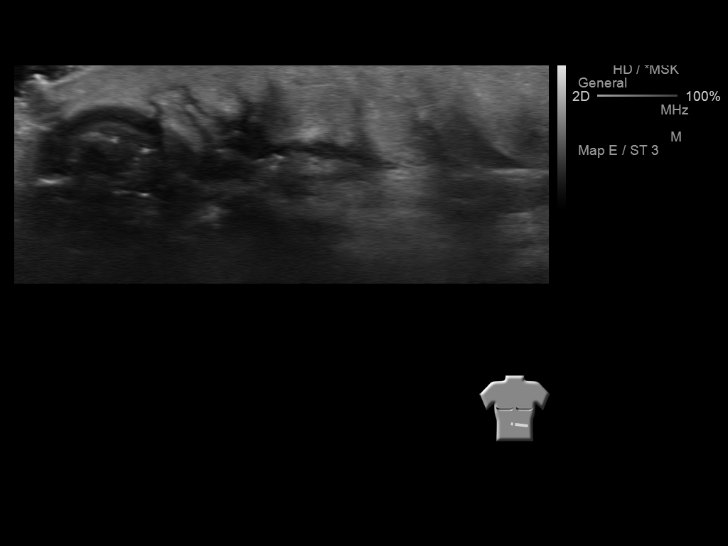
[im 15/21]
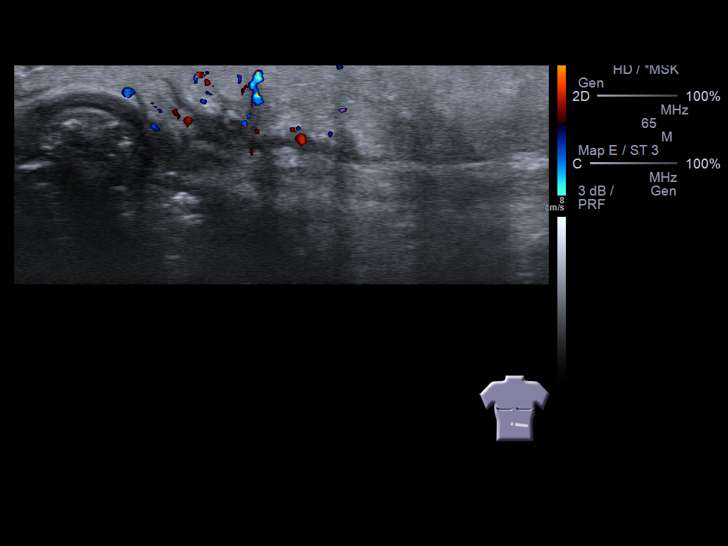
[im 16/21]
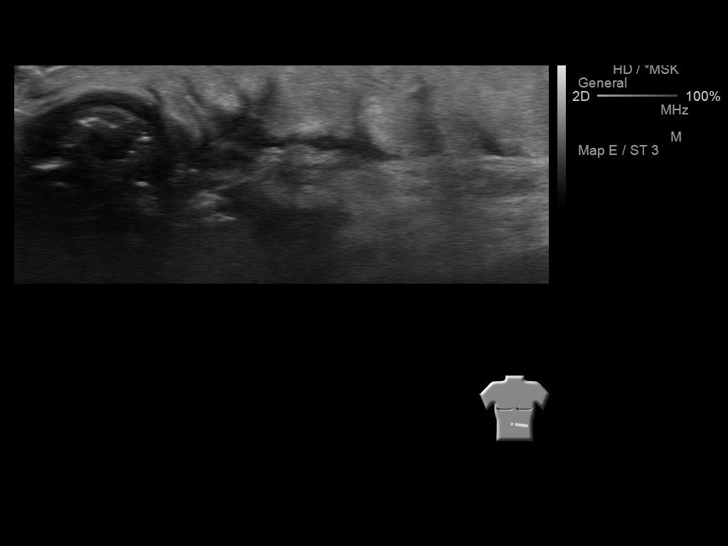
[im 18/21]
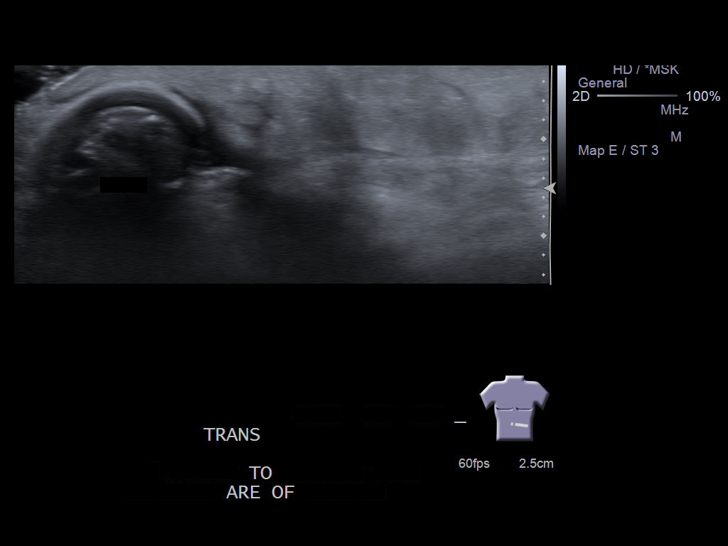
[im 19/21]
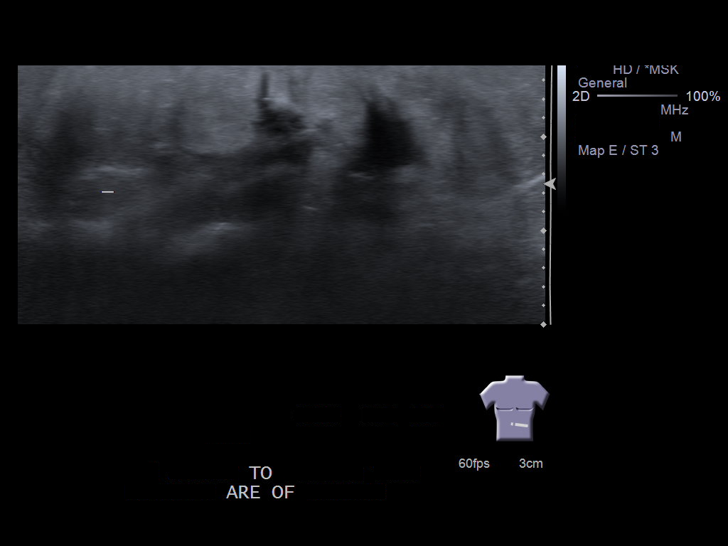
[im 21/21]
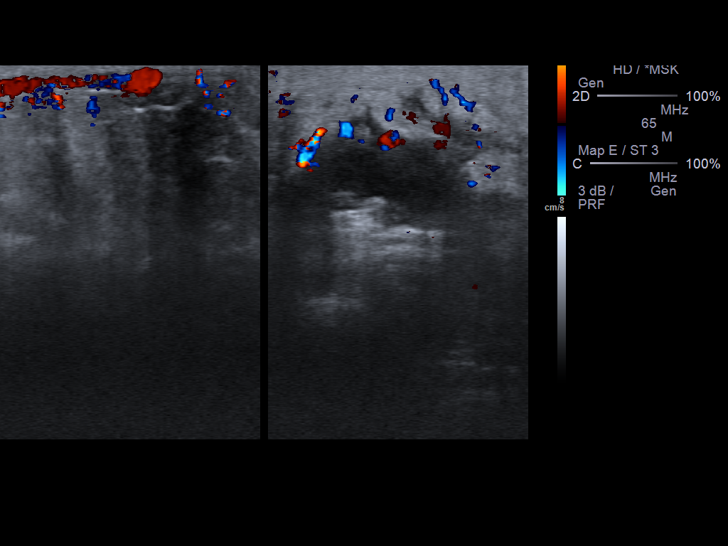

[14 of 21 positions shown; findings below may reference images not displayed]

FINDINGS: The left side of the patient's feeding tube there is a somewhat
ill-defined collection of heterogeneously hypoechoic material which
measures approximately 2.2 x 0.7 x 0.7 cm. This has increased
through transmission, compatible with a fluid collection, and there
is some surrounding hyperemia on color Doppler images.
IMPRESSION: 1. Findings, as above, suspicious for phlegmon and likely early
abscess formation to the left side of the patient's indwelling
feeding tube.
This was made a call report.

## 2014-09-26 MED ORDER — PANTOPRAZOLE SODIUM 40 MG PO PACK: 40.0000 mg | PACK | Freq: Two times a day (BID) | ORAL | Status: DC

## 2014-09-27 ENCOUNTER — Other Ambulatory Visit: Payer: Self-pay | Admitting: Physical Medicine & Rehabilitation

## 2014-10-05 ENCOUNTER — Telehealth: Payer: Self-pay | Admitting: *Deleted

## 2014-10-05 ENCOUNTER — Other Ambulatory Visit: Payer: Self-pay | Admitting: *Deleted

## 2014-10-05 NOTE — Telephone Encounter (Signed)
Mother called, [name] has not rec'd refill for dantrolene 50 mg, pt is running out, plus mother is concerned about [name]'s pain, nausea, indigestion, black stools, would really appreciate a call

## 2014-10-05 NOTE — Telephone Encounter (Signed)
Mother called, pt. has not received refill for dantrolene 50 mg, pt is running out. Please call mom or call in rx to [facility]. Plus, mom is concerned about Jan[place] condition with pain, nausea, black stools, indigestion, would really appreciate a call

## 2014-10-05 NOTE — Telephone Encounter (Signed)
Patient is having increased [place], black stool. Increased anxiety, and feeding tube is getting clogged because of protonix.

## 2014-10-06 NOTE — Telephone Encounter (Signed)
Spoke with patients mother at length Wednesday afternoon.  She stated that [name] is getting very anxious and is having more and more difficultly with the feeding tube and the protonix, which will often clog the tube.  Patient is on Iron supplements and meds for nausea, but her symptoms persist.  I asked about the refills on the  Dantrolene 50 mg and she thought there were no refills, but realized the pharmacy was incorrect and she still had 2 refills on that medication.   I advised to bring all these items up when they see the doctor next week

## 2014-10-11 NOTE — Telephone Encounter (Signed)
Please refill dantrium 50mg  tid #90, 5RF

## 2014-10-12 ENCOUNTER — Encounter: Payer: Medicaid Other | Attending: Physical Medicine & Rehabilitation | Admitting: Physical Medicine & Rehabilitation

## 2014-10-12 ENCOUNTER — Encounter: Payer: Self-pay | Admitting: Physical Medicine & Rehabilitation

## 2014-10-12 VITALS — BP 97/80 | HR 70 | Resp 14

## 2014-10-12 DIAGNOSIS — Z87820 Personal history of traumatic brain injury: Secondary | ICD-10-CM | POA: Insufficient documentation

## 2014-10-12 DIAGNOSIS — G8111 Spastic hemiplegia affecting right dominant side: Secondary | ICD-10-CM | POA: Diagnosis not present

## 2014-10-12 DIAGNOSIS — S069X4S Unspecified intracranial injury with loss of consciousness of 6 hours to 24 hours, sequela: Secondary | ICD-10-CM

## 2014-10-12 DIAGNOSIS — S52022S Displaced fracture of olecranon process without intraarticular extension of left ulna, sequela: Secondary | ICD-10-CM

## 2014-10-12 DIAGNOSIS — S52202S Unspecified fracture of shaft of left ulna, sequela: Secondary | ICD-10-CM

## 2014-10-12 DIAGNOSIS — S52302S Unspecified fracture of shaft of left radius, sequela: Secondary | ICD-10-CM

## 2014-10-12 DIAGNOSIS — G8250 Quadriplegia, unspecified: Secondary | ICD-10-CM

## 2014-10-12 MED ORDER — CITALOPRAM HYDROBROMIDE 40 MG PO TABS: 40.0000 mg | ORAL_TABLET | Freq: Every day | ORAL | Status: DC

## 2014-10-12 MED ORDER — CARBAMAZEPINE 100 MG/5ML PO SUSP: 300.0000 mg | Freq: Three times a day (TID) | ORAL | Status: DC

## 2014-10-12 MED ORDER — CLONAZEPAM 0.5 MG PO TABS: 0.5000 mg | ORAL_TABLET | Freq: Two times a day (BID) | ORAL | Status: DC | PRN

## 2014-10-12 NOTE — Progress Notes (Signed)
Subjective:    Patient ID: [name], female    DOB: 1981/03/14, 33 y.o.   MRN: [id number]  HPI   [name] is back regarding her severe TBI. She had fair results with the last botox injections, but not as pronounced as she did with the injections in the hospital. She is trying to wear braces but there are some fit issues,---particularly the left elbow splint.  Anxiety and emotional lability remain problems. She is better with her routine at home and gets very agitated when she is around new situations. She is sleeping better as a whole. Pain as a whole is better. Mom tries to stick with tylenol mostly for pain.   Spasticity remains prominent. Mom has found a schedule which works better for her arousal. She is on dantrium and zanaflex currently.  Mother is concerned about her peg site and constipation. Kara is not tolerating TF very well and is having pain and fullness quickly after feeds. She has to feed her in small amounts,frequently. Mother brought a sample of stool today which was black. [name] is getting an iron supplement.     Pain Inventory Average Pain 7 Pain Right Now 5 My pain is constant, sharp, burning, dull, stabbing, tingling and aching  In the last 24 hours, has pain interfered with the following? General activity 8 Relation with others 8 Enjoyment of life 8 What TIME of day is your pain at its worst? evening Sleep (in general) Poor  Pain is worse with: sitting, inactivity and some activites Pain improves with: heat/ice and medication Relief from Meds: 5  Mobility ability to climb steps?  no do you drive?  no needs help with transfers  Function disabled: date disabled .  Neuro/Psych spasms confusion depression anxiety  Prior Studies Any changes since last visit?  no  Physicians involved in your care Any changes since last visit?  no   History reviewed. No pertinent family history. History   Social History  . Marital Status: Divorced     Spouse Name: N/A    Number of Children: N/A  . Years of Education: N/A   Social History Main Topics  . Smoking status: Former Smoker -- 0.25 packs/day for 16 years    Types: Cigarettes    Quit date: 01/04/2014  . Smokeless tobacco: None  . Alcohol Use: Yes     Comment: occasional drinker  . Drug Use: No  . Sexual Activity: None   Other Topics Concern  . None   Social History Narrative   Past Surgical History  Procedure Laterality Date  . Abdominal hysterectomy    . Orif ulnar fracture Left 01/04/2014    Procedure: OPEN REDUCTION INTERNAL FIXATION (ORIF) ULNAR FRACTURE;  Surgeon: Alta Corning, MD;  Location: [place];  Service: Orthopedics;  Laterality: Left;  . Orif humerus fracture Left 01/04/2014    Procedure: OPEN REDUCTION INTERNAL FIXATION (ORIF) HUMERAL SHAFT FRACTURE;  Surgeon: Alta Corning, MD;  Location: [place];  Service: Orthopedics;  Laterality: Left;  . Orif radial fracture Left 01/04/2014    Procedure: OPEN REDUCTION INTERNAL FIXATION (ORIF) RADIAL FRACTURE;  Surgeon: Alta Corning, MD;  Location: [place];  Service: Orthopedics;  Laterality: Left;  . Orif elbow fracture Left 01/04/2014    Procedure: OPEN REDUCTION INTERNAL FIXATION (ORIF) ELBOW/OLECRANON FRACTURE;  Surgeon: Alta Corning, MD;  Location: [place];  Service: Orthopedics;  Laterality: Left;  . Peg placement N/A 01/12/2014    Procedure: PERCUTANEOUS ENDOSCOPIC GASTROSTOMY (PEG) PLACEMENT;  Surgeon: Gwenyth Ober, MD;  Location: [place];  Service: General;  Laterality: N/A;    bedside trach and peg  . Percutaneous tracheostomy N/A 01/12/2014    Procedure: PERCUTANEOUS TRACHEOSTOMY;  Surgeon: Gwenyth Ober, MD;  Location: [place];  Service: General;  Laterality: N/A;  BEDSIDE TRACH   History reviewed. No pertinent past medical history. BP 97/80 mmHg  Pulse 70  Resp 14  SpO2 97%  Opioid Risk Score:   Fall Risk Score: Low Fall Risk (0-5 points)  Review of Systems     Objective:   Physical Exam 3-4/4 tone  left bicep, left gastroc/soleus.  Pt with severe spastic right hemiparesis with hamstrings and gastroc, soleus, and Tib posterior most affected. Pt withdraws into primitive patterns. It's hard to demonstrate volitional movement consistently in the RLE. LUE continues to better after botox with left elbow fairly rangeable into extension.  Speech is clearer and better with conversation as a whole. She quickly becomes agitated and mood is VERY labile. She moans and yells and sometimes physically strikes out  PEG site with hypergranulation, no odor or pus. She is generally tender with manipulation of the tube but better when distracted.       Assessment & Plan:  1. This patient requires a customized wheelchair for the reasons mentioned above. Mother will contact vendor for further information. I am happy to help complete any portions of prescription she may need. 2. This patient also requires a personal care attendant at all times. Family is looking for help through personal care services to assist. 3. Recommend further PT, OT, and SLP to address motor, neuromuscular, behavior, and cognitive/speech/language/swallowing issues related to this severe injury. A home health program wouuld be most appropriate at this point 4. Nex month will perform Botox Injection RLE ---repeat to right hamstrings. Gastrocs, tib posterior, 500u. Consider left gastrocs also. Phenol would be ideal, but i dont' think she'll tolerate the surgery.     5. Maintain dantrium at 50mg  tid--don't know that she can tolerate a further increase due to sedation 6. Continue celexa, but increase to 40mg  hs.  7. Increase tegretol to 300mg  tid for agitation/ mood lability 8. Constipation: daily to bid miralax via tube. Need to be more aggressive. Fiber is ok.   -silver nitrate to stoma site.   -don't think she has blood in stool as her color and overall apperance is much brighter. 9. Initiate klonopin 0.5mg  BID, #60 for  anxiety/agitation 10. Stop trazodone 11. Will send to [facility] for adjustments to left elbow brace  Follow up in 79months for botox as above. 45 minutes of face to face patient care time were spent during this visit. All questions were encouraged and answered.

## 2014-10-12 NOTE — Patient Instructions (Signed)
APPLY THE SILVER NITRATE TO THE PINK TISSUE ABOVE THE TUBE EVERY OTHER DAY UNTIL TISSUE IS FAIRLY FLAT  MIRALAX ONCE DAILY TO TWICE DAILY UNTIL BM'S ARE MORE FORMED.  YOU MAY WANT TO HOLD IRON FOR NOW.   CHECK WITH ORTHOTIST REGARDING BRACE ADJUSTMENTS

## 2014-10-15 ENCOUNTER — Telehealth: Payer: Self-pay | Admitting: Internal Medicine

## 2014-10-15 NOTE — Telephone Encounter (Signed)
On call:  Mother called: dtr is more agitated after a nap - better now. We went over pt's meds w/mother Use Klonopin tid prn if needed (in place of bid) F/u w/Dr [name] on 11/07/14 AP

## 2014-10-17 MED ORDER — TRAZODONE HCL 50 MG PO TABS: ORAL_TABLET | ORAL | Status: DC

## 2014-10-17 NOTE — Telephone Encounter (Signed)
Ive contacted mom. We stopped klonopin and resumed trazodone

## 2014-10-17 NOTE — Telephone Encounter (Signed)
Mom called, [name] is having a major setback following changes in medication and the addition of a menstrual cycle. Mom says Tyffani is having speech problems, hallucinations, recognition problems, high anxiety and depression. She is requesting a phone call asking for help to correct [place] situation

## 2014-10-31 ENCOUNTER — Other Ambulatory Visit: Payer: Self-pay | Admitting: Physical Medicine & Rehabilitation

## 2014-11-02 ENCOUNTER — Other Ambulatory Visit: Payer: Self-pay | Admitting: *Deleted

## 2014-11-02 DIAGNOSIS — S42001S Fracture of unspecified part of right clavicle, sequela: Secondary | ICD-10-CM

## 2014-11-02 DIAGNOSIS — S42352S Displaced comminuted fracture of shaft of humerus, left arm, sequela: Secondary | ICD-10-CM

## 2014-11-02 DIAGNOSIS — S52022S Displaced fracture of olecranon process without intraarticular extension of left ulna, sequela: Secondary | ICD-10-CM

## 2014-11-02 DIAGNOSIS — S52202S Unspecified fracture of shaft of left ulna, sequela: Secondary | ICD-10-CM

## 2014-11-02 DIAGNOSIS — S52302S Unspecified fracture of shaft of left radius, sequela: Secondary | ICD-10-CM

## 2014-11-02 DIAGNOSIS — S069X7D Unspecified intracranial injury with loss of consciousness of any duration with death due to brain injury prior to regaining consciousness, subsequent encounter: Secondary | ICD-10-CM

## 2014-11-02 MED ORDER — TRAMADOL HCL 50 MG PO TABS: 50.0000 mg | ORAL_TABLET | Freq: Four times a day (QID) | ORAL | Status: DC | PRN

## 2014-11-02 NOTE — Telephone Encounter (Signed)
Called in RX Tramadol 50 mg #90 RF #3

## 2014-11-07 ENCOUNTER — Encounter: Payer: Medicaid Other | Admitting: Physical Medicine & Rehabilitation

## 2014-11-21 ENCOUNTER — Encounter: Payer: Medicaid Other | Attending: Physical Medicine & Rehabilitation | Admitting: Physical Medicine & Rehabilitation

## 2014-11-21 ENCOUNTER — Encounter: Payer: Self-pay | Admitting: Physical Medicine & Rehabilitation

## 2014-11-21 VITALS — BP 138/70 | HR 78 | Resp 14

## 2014-11-21 DIAGNOSIS — G8250 Quadriplegia, unspecified: Secondary | ICD-10-CM

## 2014-11-21 DIAGNOSIS — L89152 Pressure ulcer of sacral region, stage 2: Secondary | ICD-10-CM

## 2014-11-21 DIAGNOSIS — N390 Urinary tract infection, site not specified: Secondary | ICD-10-CM

## 2014-11-21 DIAGNOSIS — G8111 Spastic hemiplegia affecting right dominant side: Secondary | ICD-10-CM | POA: Insufficient documentation

## 2014-11-21 DIAGNOSIS — A499 Bacterial infection, unspecified: Secondary | ICD-10-CM | POA: Insufficient documentation

## 2014-11-21 DIAGNOSIS — Z87820 Personal history of traumatic brain injury: Secondary | ICD-10-CM | POA: Insufficient documentation

## 2014-11-21 NOTE — Progress Notes (Signed)
Subjective:    Patient ID: [name], female    DOB: 29-Jul-1981, 33 y.o.   MRN: [id number]  HPI  Pain Inventory Average Pain 5 Pain Right Now 5 My pain is constant and stabbing  In the last 24 hours, has pain interfered with the following? General activity 10 Relation with others 10 Enjoyment of life 10 What TIME of day is your pain at its worst? all Sleep (in general) Poor  Pain is worse with: some activites Pain improves with: rest, medication and injections Relief from Meds: 2  Mobility ability to climb steps?  no needs help with transfers  Function disabled: date disabled .  Neuro/Psych weakness confusion depression anxiety  Prior Studies Any changes since last visit?  no  Physicians involved in your care Any changes since last visit?  no   History reviewed. No pertinent family history. History   Social History  . Marital Status: Divorced    Spouse Name: N/A    Number of Children: N/A  . Years of Education: N/A   Social History Main Topics  . Smoking status: Former Smoker -- 0.25 packs/day for 16 years    Types: Cigarettes    Quit date: 01/04/2014  . Smokeless tobacco: None  . Alcohol Use: Yes     Comment: occasional drinker  . Drug Use: No  . Sexual Activity: None   Other Topics Concern  . None   Social History Narrative   Past Surgical History  Procedure Laterality Date  . Abdominal hysterectomy    . Orif ulnar fracture Left 01/04/2014    Procedure: OPEN REDUCTION INTERNAL FIXATION (ORIF) ULNAR FRACTURE;  Surgeon: Alta Corning, MD;  Location: [place];  Service: Orthopedics;  Laterality: Left;  . Orif humerus fracture Left 01/04/2014    Procedure: OPEN REDUCTION INTERNAL FIXATION (ORIF) HUMERAL SHAFT FRACTURE;  Surgeon: Alta Corning, MD;  Location: [place];  Service: Orthopedics;  Laterality: Left;  . Orif radial fracture Left 01/04/2014    Procedure: OPEN REDUCTION INTERNAL FIXATION (ORIF) RADIAL FRACTURE;  Surgeon: Alta Corning,  MD;  Location: [place];  Service: Orthopedics;  Laterality: Left;  . Orif elbow fracture Left 01/04/2014    Procedure: OPEN REDUCTION INTERNAL FIXATION (ORIF) ELBOW/OLECRANON FRACTURE;  Surgeon: Alta Corning, MD;  Location: [place];  Service: Orthopedics;  Laterality: Left;  . Peg placement N/A 01/12/2014    Procedure: PERCUTANEOUS ENDOSCOPIC GASTROSTOMY (PEG) PLACEMENT;  Surgeon: Gwenyth Ober, MD;  Location: [place];  Service: General;  Laterality: N/A;    bedside trach and peg  . Percutaneous tracheostomy N/A 01/12/2014    Procedure: PERCUTANEOUS TRACHEOSTOMY;  Surgeon: Gwenyth Ober, MD;  Location: [place];  Service: General;  Laterality: N/A;  BEDSIDE TRACH   History reviewed. No pertinent past medical history. BP 138/70 mmHg  Pulse 78  Resp 14  SpO2 96%  Opioid Risk Score:   Fall Risk Score: Low Fall Risk (0-5 points)  Review of Systems  HENT: Negative.   Eyes: Negative.   Respiratory: Negative.   Cardiovascular: Negative.   Gastrointestinal: Negative.   Endocrine: Negative.   Genitourinary: Negative.   Musculoskeletal: Negative.   Skin: Negative.   Allergic/Immunologic: Negative.   Neurological: Positive for speech difficulty and weakness.  Hematological: Negative.   Psychiatric/Behavioral: Positive for dysphoric mood and agitation. The patient is nervous/anxious.        Objective:   Physical Exam        Assessment & Plan:  Botox Injection for  spasticity using needle EMG guidance Indication: spastic tetraplegia RLE  Dilution: 100 Units/ml        Total Units Injected: 500 Indication: Severe spasticity which interferes with ADL,mobility and/or  hygiene and is unresponsive to medication management and other conservative care Informed consent was obtained after describing risks and benefits of the procedure with the patient. This includes bleeding, bruising, infection, excessive weakness, or medication side effects. A REMS form is on file and signed.  Needle: 69mm  injectable monopolar needle electrode  Number of units per muscle Gastroc/soleus 100 units in 4 access points Hamstrings 200 units with 6 access points Tibialis Posterior 100 units with 4 access points Tibialis Anterior 100units with 3 access points EHL 0 units All injections were done  after negative drawback for blood. The patient tolerated the procedure well. Post procedure instructions were given. A followup appointment was made.    Will arrange [place] follow up for wound care and urine testing

## 2014-11-28 ENCOUNTER — Telehealth: Payer: Self-pay | Admitting: *Deleted

## 2014-11-28 ENCOUNTER — Other Ambulatory Visit: Payer: Self-pay | Admitting: *Deleted

## 2014-11-28 DIAGNOSIS — G8250 Quadriplegia, unspecified: Secondary | ICD-10-CM

## 2014-11-28 DIAGNOSIS — R1310 Dysphagia, unspecified: Secondary | ICD-10-CM

## 2014-11-28 DIAGNOSIS — S069X7D Unspecified intracranial injury with loss of consciousness of any duration with death due to brain injury prior to regaining consciousness, subsequent encounter: Secondary | ICD-10-CM

## 2014-11-28 MED ORDER — PANTOPRAZOLE SODIUM 40 MG PO TBEC: 40.0000 mg | DELAYED_RELEASE_TABLET | Freq: Two times a day (BID) | ORAL | Status: DC

## 2014-11-28 NOTE — Telephone Encounter (Signed)
Called patient's mother and told her that I corrected the RX and sent it in to the pharmacy electronically.

## 2014-11-28 NOTE — Telephone Encounter (Signed)
Pt's mother says Dr. [name] was going to change protonix Rx from granules to oral tablets, pharmacy has not rec'd new Rx, asking for authorization of new script

## 2014-12-05 MED ORDER — CARBAMAZEPINE 200 MG PO TABS: 200.0000 mg | ORAL_TABLET | Freq: Four times a day (QID) | ORAL | Status: DC

## 2014-12-05 MED ORDER — PROPRANOLOL HCL 40 MG PO TABS: 40.0000 mg | ORAL_TABLET | Freq: Three times a day (TID) | ORAL | Status: DC

## 2014-12-05 NOTE — Telephone Encounter (Signed)
Called and notified pt's mother about the Rx changes, mom went on to add that homecare orders for urinalysis and peg site care were sent to [facility] and she is requesting that those orders be resent to [facility], she then brought up the G-tube again and is really hoping for it to be removed ASAP as it is 'causing all kinds of trouble'

## 2014-12-05 NOTE — Telephone Encounter (Signed)
i thought i had----looks like someone already changed them on 12/21

## 2014-12-05 NOTE — Telephone Encounter (Signed)
She has received pill form of protonix now.  She would like to get the tegretol and inderal changed to pill form as well, and is asking about possiblity of getting peg removed asap.(return appt is not until February 10th)

## 2014-12-05 NOTE — Telephone Encounter (Signed)
Can change them to pill form as well. i have done so. i think tube can wait until February unless an opening arises.

## 2014-12-06 NOTE — Telephone Encounter (Signed)
I reviewed the referral for [name] and Corene Cornea had written a note stating that [facility] called and said they would not be able to see our patient because of limited help in the area, at the mothers request should we have Corene Cornea reroute the referral to [facility]?

## 2014-12-06 NOTE — Telephone Encounter (Signed)
i sent them to [place]!!!!!!!  She can contact [facility]---they placed it. To see if they can remove it sooner. If something opens up earlier for me, I would be happy to take it out.

## 2014-12-13 NOTE — Telephone Encounter (Signed)
Patient mother called again - asking that we send referral to [facility] for her oral meds.

## 2014-12-13 NOTE — Telephone Encounter (Signed)
I called and spoke with the [name]. Because [name] is now on everything oral and is eating and the peg tub will be coming out, her [name] is asking that we refer to [facility] for a new reassessment and she can get an additional 50 hours/mo care so that  [name] can go back to work.  She is not required to manage peg care now.  [facility] is still overseeing the the peg site and any issues related to it or urinary care and will make a weekly visit for only a few more times.  [name] was not able to fulfill PCS needs and [name] will be able to do this for her.  She is going to wait for the 01/18/15 appt to have Dr [name] remove PEG.  [name] is doing well with pills eating and drinking and the site is just requiring a gauze around tube an site. May we refer for PCS reassessment?

## 2014-12-13 NOTE — Telephone Encounter (Signed)
Yes, that's fine 

## 2014-12-14 NOTE — Telephone Encounter (Signed)
Referral placed.

## 2015-01-02 DIAGNOSIS — L89152 Pressure ulcer of sacral region, stage 2: Secondary | ICD-10-CM | POA: Diagnosis not present

## 2015-01-02 DIAGNOSIS — Z87440 Personal history of urinary (tract) infections: Secondary | ICD-10-CM | POA: Diagnosis not present

## 2015-01-02 DIAGNOSIS — Z4589 Encounter for adjustment and management of other implanted devices: Secondary | ICD-10-CM | POA: Diagnosis not present

## 2015-01-02 DIAGNOSIS — Z4801 Encounter for change or removal of surgical wound dressing: Secondary | ICD-10-CM | POA: Diagnosis not present

## 2015-01-18 ENCOUNTER — Encounter: Payer: Medicaid Other | Attending: Physical Medicine & Rehabilitation | Admitting: Physical Medicine & Rehabilitation

## 2015-01-18 ENCOUNTER — Encounter: Payer: Self-pay | Admitting: Physical Medicine & Rehabilitation

## 2015-01-18 VITALS — BP 97/59 | HR 67 | Resp 14

## 2015-01-18 DIAGNOSIS — G8250 Quadriplegia, unspecified: Secondary | ICD-10-CM

## 2015-01-18 DIAGNOSIS — S069X4S Unspecified intracranial injury with loss of consciousness of 6 hours to 24 hours, sequela: Secondary | ICD-10-CM

## 2015-01-18 DIAGNOSIS — G8111 Spastic hemiplegia affecting right dominant side: Secondary | ICD-10-CM | POA: Insufficient documentation

## 2015-01-18 DIAGNOSIS — K592 Neurogenic bowel, not elsewhere classified: Secondary | ICD-10-CM

## 2015-01-18 DIAGNOSIS — Z87820 Personal history of traumatic brain injury: Secondary | ICD-10-CM | POA: Insufficient documentation

## 2015-01-18 DIAGNOSIS — E46 Unspecified protein-calorie malnutrition: Secondary | ICD-10-CM

## 2015-01-18 DIAGNOSIS — L709 Acne, unspecified: Secondary | ICD-10-CM

## 2015-01-18 DIAGNOSIS — E40 Kwashiorkor: Secondary | ICD-10-CM

## 2015-01-18 MED ORDER — MELOXICAM 7.5 MG PO TABS: 7.5000 mg | ORAL_TABLET | Freq: Every day | ORAL | Status: DC

## 2015-01-18 MED ORDER — ENSURE HIGH PROTEIN PO LIQD: 1.0000 | Freq: Two times a day (BID) | ORAL | Status: DC

## 2015-01-18 MED ORDER — MULTI-VITAMIN/MINERALS PO TABS: 1.0000 | ORAL_TABLET | Freq: Every day | ORAL | Status: DC

## 2015-01-18 MED ORDER — CALCIUM CARBONATE-VITAMIN D 500-200 MG-UNIT PO TABS: 1.0000 | ORAL_TABLET | Freq: Two times a day (BID) | ORAL | Status: DC

## 2015-01-18 MED ORDER — SACCHAROMYCES BOULARDII 250 MG PO CAPS: 500.0000 mg | ORAL_CAPSULE | Freq: Two times a day (BID) | ORAL | Status: DC

## 2015-01-18 MED ORDER — CLINDAMYCIN-TRETINOIN 1.2-0.025 % EX GEL: Freq: Every day | CUTANEOUS | Status: DC

## 2015-01-18 NOTE — Patient Instructions (Signed)
PLEASE CALL ME WITH ANY PROBLEMS OR QUESTIONS (#[phone]).      

## 2015-01-18 NOTE — Progress Notes (Signed)
Subjective:    Patient ID: [name], female    DOB: 05-Jun-1981, 34 y.o.   MRN: [id number]  HPI  [name] is back regarding her TBI and multiple medical issues. She is ready for her g-tube to be taken out. She is eating well. She had good results with botox last month. Mood has been better. Mother has continued to provide total care for her.  Pain Inventory Average Pain 7 Pain Right Now 4 My pain is dull and aching  In the last 24 hours, has pain interfered with the following? General activity 7 Relation with others 7 Enjoyment of life 7 What TIME of day is your pain at its worst? morning and evening Sleep (in general) NA  Pain is worse with: inactivity Pain improves with: heat/ice and medication Relief from Meds: 1  Mobility needs help with transfers  Function disabled: date disabled . I need assistance with the following:  feeding, dressing, bathing, toileting, meal prep, household duties and shopping  Neuro/Psych bowel control problems numbness tingling confusion anxiety  Prior Studies Any changes since last visit?  no  Physicians involved in your care Any changes since last visit?  no   No family history on file. History   Social History  . Marital Status: Divorced    Spouse Name: N/A  . Number of Children: N/A  . Years of Education: N/A   Social History Main Topics  . Smoking status: Former Smoker -- 0.25 packs/day for 16 years    Types: Cigarettes    Quit date: 01/04/2014  . Smokeless tobacco: Not on file  . Alcohol Use: Yes     Comment: occasional drinker  . Drug Use: No  . Sexual Activity: Not on file   Other Topics Concern  . None   Social History Narrative   Past Surgical History  Procedure Laterality Date  . Abdominal hysterectomy    . Orif ulnar fracture Left 01/04/2014    Procedure: OPEN REDUCTION INTERNAL FIXATION (ORIF) ULNAR FRACTURE;  Surgeon: Alta Corning, MD;  Location: [place];  Service: Orthopedics;  Laterality:  Left;  . Orif humerus fracture Left 01/04/2014    Procedure: OPEN REDUCTION INTERNAL FIXATION (ORIF) HUMERAL SHAFT FRACTURE;  Surgeon: Alta Corning, MD;  Location: [place];  Service: Orthopedics;  Laterality: Left;  . Orif radial fracture Left 01/04/2014    Procedure: OPEN REDUCTION INTERNAL FIXATION (ORIF) RADIAL FRACTURE;  Surgeon: Alta Corning, MD;  Location: [place];  Service: Orthopedics;  Laterality: Left;  . Orif elbow fracture Left 01/04/2014    Procedure: OPEN REDUCTION INTERNAL FIXATION (ORIF) ELBOW/OLECRANON FRACTURE;  Surgeon: Alta Corning, MD;  Location: [place];  Service: Orthopedics;  Laterality: Left;  . Peg placement N/A 01/12/2014    Procedure: PERCUTANEOUS ENDOSCOPIC GASTROSTOMY (PEG) PLACEMENT;  Surgeon: Gwenyth Ober, MD;  Location: [place];  Service: General;  Laterality: N/A;    bedside trach and peg  . Percutaneous tracheostomy N/A 01/12/2014    Procedure: PERCUTANEOUS TRACHEOSTOMY;  Surgeon: Gwenyth Ober, MD;  Location: [place];  Service: General;  Laterality: N/A;  BEDSIDE TRACH   No past medical history on file. BP 97/59 mmHg  Pulse 67  Resp 14  SpO2 99%  Opioid Risk Score:   Fall Risk Score: Low Fall Risk (0-5 points) Review of Systems  Gastrointestinal: Positive for nausea, abdominal pain and constipation.       Bowel Control Problems  Neurological: Positive for numbness.       Tingling  Psychiatric/Behavioral: Positive for confusion. The patient is nervous/anxious.   All other systems reviewed and are negative.      Objective:   Physical Exam  3-4/4 tone left bicep, left gastroc/soleus.  Pt has better ontrol of RLE---initiates hip extension and knee extension. Still tight at hamstrings and ankle---3/4. LUE spastic Speech is clearer and better with conversation as a whole. She quickly becomes agitated and mood is VERY labile. She moans and yells and sometimes physically strikes out  PEG site still with hypergranulation, no odor or pus. stie a little tender.  She is generally tender with manipulation of the tube but better when distracted.      Assessment & Plan:  1. Continue with HEP as possible. counseling as provided to family today 2. This patient also requires a personal care attendant at all times. See below 3. Recommend further PT, OT, and SLP to address motor, neuromuscular, behavior, and cognitive/speech/language/swallowing issues related to this severe injury. A home health referral was made. She also needs a PCA. 4. In two months: Botox Injection RLE ---repeat to right hamstrings. Gastrocs, tib posterior, 500u.    5. Maintain dantrium at 50mg  tid--don't know that she can tolerate a further increase due to sedation 6. Continue celexa, but increase to 40mg  hs.  7. Increase tegretol to 300mg  tid for agitation/ mood lability 8. Constipation: daily to bid miralax via tube. Need to be more aggressive. Fiber is ok. rx for florastor  -silver nitrate to stoma             -peg removed with traction today---dry dressing placed-instructions given -don't think she has blood in stool as her color and overall apperance is much brighter. 9. clinda-tretinoin for acne 10. mobic for joint and general pain 11. Will send to [facility] for adjustments to left elbow brace and right knee brace  Follow up in 2 months for botox as above. 45 minutes of face to face patient care time were spent during this visit. All questions were encouraged and answered.

## 2015-01-20 ENCOUNTER — Other Ambulatory Visit: Payer: Self-pay | Admitting: Physical Medicine & Rehabilitation

## 2015-01-28 ENCOUNTER — Other Ambulatory Visit: Payer: Self-pay | Admitting: Physical Medicine & Rehabilitation

## 2015-02-06 ENCOUNTER — Telehealth: Payer: Self-pay | Admitting: *Deleted

## 2015-02-06 DIAGNOSIS — S069X4S Unspecified intracranial injury with loss of consciousness of 6 hours to 24 hours, sequela: Secondary | ICD-10-CM

## 2015-02-06 DIAGNOSIS — K592 Neurogenic bowel, not elsewhere classified: Secondary | ICD-10-CM

## 2015-02-06 DIAGNOSIS — E46 Unspecified protein-calorie malnutrition: Secondary | ICD-10-CM

## 2015-02-06 DIAGNOSIS — L709 Acne, unspecified: Secondary | ICD-10-CM

## 2015-02-06 DIAGNOSIS — G8250 Quadriplegia, unspecified: Secondary | ICD-10-CM

## 2015-02-06 NOTE — Telephone Encounter (Addendum)
Toma Aran called and said: 1)  she needs an URGENT letter from Dr [name] stating that Krystin continues to be incompetent ( for the attorneys).  2) She was asking about the tizanidine which is not on her list of meds now. And 3) Also she is asking about the referral to [facility].  I see it was placed and I will send this message to [place] as well for him to follow up on this.

## 2015-02-07 NOTE — Telephone Encounter (Signed)
And how do we order ensure thru [facility]?

## 2015-02-07 NOTE — Telephone Encounter (Addendum)
I left message for [name] informing her of the letter being ready and we need to know where to send or how to handle.  I asked [name] to look into the [facility] referral, and I told her to not give the tizanidine. The only other thing she has added is a referral to [facility] for the nutritional supplements(Ensure) and I don't see a previous one to repeat.

## 2015-02-07 NOTE — Telephone Encounter (Signed)
Letter written. Do not restart tizanidine. She's on baclofen and dantrium currently.

## 2015-02-07 NOTE — Telephone Encounter (Signed)
I have mailed the copy of the letter to [facility] and faxed a copy as well.  She is also requesting a copy be emailed to [email or web address].  She has not signed up for mychart so I intiated a code so that she can sign up for it and we can email the letter that way.  Everything has been addressed except for the order for the nutritional supplements for the Ensure through [facility].

## 2015-02-08 MED ORDER — ENSURE HIGH PROTEIN PO LIQD
1.0000 | Freq: Two times a day (BID) | ORAL | Status: DC
Start: 2015-02-08 — End: 2017-05-15

## 2015-02-08 NOTE — Telephone Encounter (Addendum)
I spoke with [facility].  They need to know how much she is getting and what formula and then have a signed order with your NPI faxed to them.  I called [name] back to aske what she is getting and right now she is just getting protein shakes which are very expensive.  She says anything with high protein would be helpful, and at least 2x day.  I placed an order for 90 day supply in meds and orders and chose [facility] pharmacy on [address] as the pharmacy and hopefully this will be what is needed.  She is still requesting a copy of the letter be faxed (done) mailed (done) and emailed to [email or web address] but we cannot send letter through regular email.  I told her to get her signed up for my chart and one can be sent tha way but at this point it doesn't appear to be activated.

## 2015-02-09 NOTE — Telephone Encounter (Signed)
[name] has called back again about the [place] home health order that was put in on 01/18/15 by Dr [name] (and I forwarded you the attached message 02/07/15 to follow up on that referral.  [name] says that [name] has never received the information.  The fax # is [phone]

## 2015-02-11 ENCOUNTER — Other Ambulatory Visit: Payer: Self-pay | Admitting: Physical Medicine & Rehabilitation

## 2015-02-13 ENCOUNTER — Telehealth: Payer: Self-pay | Admitting: Physical Medicine & Rehabilitation

## 2015-02-13 HISTORY — PX: PEG TUBE REMOVAL: SHX2187

## 2015-02-13 NOTE — Telephone Encounter (Signed)
Called and left message to make sure that the patient has been contacted by [facility], we have faxed the order 2X to two different fax numbers both times receiving confirmations.

## 2015-02-18 ENCOUNTER — Other Ambulatory Visit: Payer: Self-pay | Admitting: Physical Medicine & Rehabilitation

## 2015-03-01 ENCOUNTER — Telehealth: Payer: Self-pay | Admitting: *Deleted

## 2015-03-01 MED ORDER — DOXYCYCLINE HYCLATE 100 MG PO TABS: 100.0000 mg | ORAL_TABLET | Freq: Every day | ORAL | Status: DC

## 2015-03-01 NOTE — Telephone Encounter (Signed)
Notified Jeralyn Bennett- of change from clindamycin gel to the doxycycline tabs.

## 2015-03-20 ENCOUNTER — Other Ambulatory Visit: Payer: Self-pay | Admitting: *Deleted

## 2015-03-20 ENCOUNTER — Other Ambulatory Visit: Payer: Self-pay | Admitting: Physical Medicine & Rehabilitation

## 2015-03-20 MED ORDER — LORAZEPAM 0.5 MG PO TABS: ORAL_TABLET | ORAL | Status: DC

## 2015-03-20 NOTE — Telephone Encounter (Signed)
Reordered ativan as call in

## 2015-03-22 ENCOUNTER — Encounter: Payer: Medicaid Other | Attending: Physical Medicine & Rehabilitation | Admitting: Physical Medicine & Rehabilitation

## 2015-03-22 ENCOUNTER — Encounter: Payer: Self-pay | Admitting: Physical Medicine & Rehabilitation

## 2015-03-22 VITALS — BP 129/92 | HR 67 | Resp 16

## 2015-03-22 DIAGNOSIS — Z87820 Personal history of traumatic brain injury: Secondary | ICD-10-CM | POA: Insufficient documentation

## 2015-03-22 DIAGNOSIS — G8111 Spastic hemiplegia affecting right dominant side: Secondary | ICD-10-CM | POA: Diagnosis not present

## 2015-03-22 DIAGNOSIS — G8250 Quadriplegia, unspecified: Secondary | ICD-10-CM

## 2015-03-22 DIAGNOSIS — S52022S Displaced fracture of olecranon process without intraarticular extension of left ulna, sequela: Secondary | ICD-10-CM | POA: Diagnosis not present

## 2015-03-22 NOTE — Patient Instructions (Addendum)
CONTINUE TO BE AGGRESSIVE WITH STRETCHING AND RANGE OF MOTION.
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
## 2015-03-27 ENCOUNTER — Other Ambulatory Visit: Payer: Self-pay | Admitting: Physical Medicine & Rehabilitation

## 2015-03-27 ENCOUNTER — Ambulatory Visit: Payer: Self-pay | Admitting: Physical Medicine & Rehabilitation

## 2015-04-01 NOTE — Consult Note (Signed)
Brief Consult Note: Diagnosis: Rectal bleeding.   Patient was seen by consultant.   Consult note dictated.   Comments: DRE by me red blood on gloved finger. Vaginal exam with white discharge on gloved finger. This is a rectal bleed likely from hemorrhoids. Mother reports a negative colonoscopy about one year ago in [place] for new onset constipation w BRBPR. PLAN: Dr. Vira Agar recommends Anusol HC supp bid, hold coumadin for a few days. Monitor serial hgb. Continue indefinite ppi therapy. OK to restart tube feeds.  Electronic Signatures: Gershon Mussel (NP)  (Signed 05-Mar-15 14:40)  Authored: Brief Consult Note   Last Updated: 05-Mar-15 14:40 by Gershon Mussel (NP)
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
## 2015-04-01 NOTE — Consult Note (Signed)
Patient with marroon red stools today times 4.  Hgb fallen to 10.8 this morning.  Coming off anticoagulation meds.  on Protonix bid.  Could be stress ulcers from her multiple fractures/head trauma.  Pt mom wanted to hold on any sedation for anything not essential but if bleeding continues may not have a choice.  Abd flat and not distended, no involuntary guarding.  Check Hgb daily over weekend.  Dr. Rayann Heman on call this weekend and will see Sat and Sun.  Electronic Signatures: Manya Silvas (MD)  (Signed on 06-Mar-15 18:05)  Authored  Last Updated: 06-Mar-15 18:05 by Manya Silvas (MD)

## 2015-04-01 NOTE — H&P (Signed)
PATIENT NAME:  [name], [name] MR#:  [id number] DATE OF BIRTH:  1981/05/03  DATE OF ADMISSION:  02/09/2014  PRIMARY CARE PHYSICIAN: Dr. Frazier Richards.   REFERRING PHYSICIAN: Dr. Hinda Kehr.   CHIEF COMPLAINT:   DICTATION ENDS HERE   ____________________________ Monica Becton, MD pv:gb D: 02/09/2014 23:24:18 ET T: 02/09/2014 23:59:54 ET JOB#: [id number]  cc: Monica Becton, MD, <Dictator> Monica Becton MD ELECTRONICALLY SIGNED 03/15/2014 20:58
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
## 2015-04-01 NOTE — Consult Note (Signed)
20 Fr G tube placed at a new location. Insertion site at 3 cm. Can use G tube for water flushings and meds today. If stable, can start TF tomorrow AM. Keep area clean with betadine or H2O2 daily. Make sure G tube flushed regularly before and after [facility] use. thanks.  Electronic Signatures: Verdie Shire (MD)  (Signed on 23-Mar-15 12:58)  Authored  Last Updated: 23-Mar-15 12:58 by Verdie Shire (MD)
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
## 2015-04-04 ENCOUNTER — Encounter: Payer: Medicaid Other | Admitting: Physical Medicine & Rehabilitation

## 2015-04-12 ENCOUNTER — Encounter: Payer: Medicaid Other | Attending: Physical Medicine & Rehabilitation | Admitting: Physical Medicine & Rehabilitation

## 2015-04-12 ENCOUNTER — Encounter: Payer: Self-pay | Admitting: Physical Medicine & Rehabilitation

## 2015-04-12 VITALS — BP 102/67 | HR 73 | Resp 14

## 2015-04-12 DIAGNOSIS — G8250 Quadriplegia, unspecified: Secondary | ICD-10-CM | POA: Diagnosis not present

## 2015-04-12 DIAGNOSIS — Z87820 Personal history of traumatic brain injury: Secondary | ICD-10-CM | POA: Diagnosis present

## 2015-04-12 DIAGNOSIS — G8111 Spastic hemiplegia affecting right dominant side: Secondary | ICD-10-CM | POA: Insufficient documentation

## 2015-04-12 DIAGNOSIS — S069X5S Unspecified intracranial injury with loss of consciousness greater than 24 hours with return to pre-existing conscious level, sequela: Secondary | ICD-10-CM | POA: Diagnosis not present

## 2015-04-12 DIAGNOSIS — F411 Generalized anxiety disorder: Secondary | ICD-10-CM | POA: Diagnosis not present

## 2015-04-12 MED ORDER — DIAZEPAM 5 MG PO TABS
5.0000 mg | ORAL_TABLET | Freq: Four times a day (QID) | ORAL | Status: DC | PRN
Start: 2015-04-12 — End: 2017-04-21

## 2015-04-12 NOTE — Patient Instructions (Signed)
PLEASE CALL ME WITH ANY PROBLEMS OR QUESTIONS (#[phone]).      

## 2015-04-12 NOTE — Progress Notes (Signed)
Botox Injection for spasticity using needle EMG guidance Indication: spastic tetraplegia---lower extremity focus today as well as left biceps  Dilution: 100 Units/ml        Total Units Injected: 500 units Indication: Severe spasticity which interferes with ADL,mobility and/or  hygiene and is unresponsive to medication management and other conservative care Informed consent was obtained after describing risks and benefits of the procedure with the patient. This includes bleeding, bruising, infection, excessive weakness, or medication side effects. A REMS form is on file and signed.  Needle: 103mm injectable monopolar needle electrode  Number of units per muscle Pectoralis Major 0 units Pectoralis Minor 0 units Biceps 100 units, 4 access points Brachioradialis 0 units FCR 0 units FCU 0 units FDS 0 units FDP 0 units FPL 0 units Pronator Teres 0 units Pronator Quadratus 0 units Quadriceps 0 units Hamstrings: 200 units RLE and 200 units LLE, 6 access points each Left gastroc/soleus: 200 units, 6 access points  Phenol Injection:  Was not attempted due to behavior     All injections were done after obtaining appropriate EMG activity and after negative drawback for blood. The patient tolerated the procedure well. Post procedure instructions were given. A followup appointment was made for 3 months.

## 2015-05-04 ENCOUNTER — Other Ambulatory Visit: Payer: Self-pay | Admitting: Physical Medicine & Rehabilitation

## 2015-05-11 ENCOUNTER — Other Ambulatory Visit: Payer: Self-pay | Admitting: Physical Medicine & Rehabilitation

## 2015-06-01 ENCOUNTER — Other Ambulatory Visit: Payer: Self-pay | Admitting: Physical Medicine & Rehabilitation

## 2015-06-08 ENCOUNTER — Other Ambulatory Visit: Payer: Self-pay | Admitting: Physical Medicine & Rehabilitation

## 2015-06-08 ENCOUNTER — Other Ambulatory Visit: Payer: Self-pay | Admitting: *Deleted

## 2015-06-08 MED ORDER — PANTOPRAZOLE SODIUM 40 MG PO TBEC: DELAYED_RELEASE_TABLET | ORAL | Status: DC

## 2015-06-08 NOTE — Addendum Note (Signed)
Addended by: Caro Hight on: 06/08/2015 03:09 PM   Modules accepted: Orders

## 2015-06-15 ENCOUNTER — Other Ambulatory Visit: Payer: Self-pay | Admitting: Physical Medicine & Rehabilitation

## 2015-07-01 ENCOUNTER — Other Ambulatory Visit: Payer: Self-pay | Admitting: Physical Medicine & Rehabilitation

## 2015-07-08 ENCOUNTER — Other Ambulatory Visit: Payer: Self-pay | Admitting: Physical Medicine & Rehabilitation

## 2015-07-11 ENCOUNTER — Other Ambulatory Visit: Payer: Self-pay | Admitting: Physical Medicine & Rehabilitation

## 2015-07-12 NOTE — Telephone Encounter (Signed)
YOu have not mentioned in your note about tramadol for Adean in a year or more.  The pharmacy is asking for a refill.  Do you want to give [place] permission to refill? (it will have to be called in)

## 2015-07-13 ENCOUNTER — Telehealth: Payer: Self-pay | Admitting: *Deleted

## 2015-07-13 MED ORDER — TRAMADOL HCL 50 MG PO TABS: 50.0000 mg | ORAL_TABLET | Freq: Four times a day (QID) | ORAL | Status: DC | PRN

## 2015-07-13 NOTE — Telephone Encounter (Signed)
Tramadol refill order was phoned to pharmacy after receiving electronic request for refill.. Dr [name] refilled it yesterday(8/3) but it did not go to pharmacy, so reordered today and phoned in.

## 2015-07-14 ENCOUNTER — Other Ambulatory Visit: Payer: Self-pay | Admitting: *Deleted

## 2015-07-14 MED ORDER — TRAMADOL HCL 50 MG PO TABS: 50.0000 mg | ORAL_TABLET | Freq: Four times a day (QID) | ORAL | Status: DC | PRN

## 2015-07-14 NOTE — Telephone Encounter (Signed)
Called in Tramadol 50 mg tablets #90  #RF3 - take 1 tablet q6h PRN

## 2015-07-25 ENCOUNTER — Telehealth: Payer: Self-pay | Admitting: Physical Medicine & Rehabilitation

## 2015-07-25 NOTE — Telephone Encounter (Signed)
Patients father Juanna Cao) would like a call back in re: to Botox injections--wants to know how long they last and have they benefited patient.  Please call him at [phone].

## 2015-07-26 ENCOUNTER — Encounter: Payer: Medicaid Other | Admitting: Physical Medicine & Rehabilitation

## 2015-07-26 NOTE — Telephone Encounter (Signed)
Called and left VM

## 2015-07-31 ENCOUNTER — Telehealth: Payer: Self-pay | Admitting: Physical Medicine & Rehabilitation

## 2015-07-31 ENCOUNTER — Other Ambulatory Visit: Payer: Self-pay | Admitting: Physical Medicine & Rehabilitation

## 2015-08-04 NOTE — Telephone Encounter (Signed)
i have called him and left a message

## 2015-08-22 ENCOUNTER — Telehealth: Payer: Self-pay | Admitting: *Deleted

## 2015-08-22 NOTE — Telephone Encounter (Signed)
[name] called,  She is asking for a valium script for [facility] for pre-procedure and regular office visit anxiety.  She says she had misplaced a previous paper script you had given them.  [name] checked the [employer] and it has not been filled...please advise

## 2015-08-23 NOTE — Telephone Encounter (Signed)
5-10mg  qd prn prior to procedure,ov  #8, 1 RF

## 2015-08-23 NOTE — Telephone Encounter (Signed)
Called to pharmacy, and Abbie's mother notified.

## 2015-09-11 ENCOUNTER — Other Ambulatory Visit: Payer: Self-pay | Admitting: Physical Medicine & Rehabilitation

## 2015-09-12 NOTE — Telephone Encounter (Signed)
May use 200mg  QID

## 2015-09-14 ENCOUNTER — Other Ambulatory Visit: Payer: Self-pay | Admitting: *Deleted

## 2015-09-14 MED ORDER — LORAZEPAM 0.5 MG PO TABS: ORAL_TABLET | ORAL | Status: DC

## 2015-09-18 ENCOUNTER — Ambulatory Visit: Payer: Medicaid Other | Admitting: Physical Medicine & Rehabilitation

## 2015-09-18 ENCOUNTER — Other Ambulatory Visit: Payer: Self-pay | Admitting: Physical Medicine & Rehabilitation

## 2015-09-26 ENCOUNTER — Ambulatory Visit: Payer: Medicaid Other | Admitting: Physical Medicine & Rehabilitation

## 2015-09-29 ENCOUNTER — Ambulatory Visit: Payer: Medicaid Other | Admitting: Physical Medicine & Rehabilitation

## 2015-10-02 ENCOUNTER — Other Ambulatory Visit: Payer: Self-pay | Admitting: Physical Medicine & Rehabilitation

## 2015-10-06 ENCOUNTER — Encounter: Payer: Self-pay | Admitting: Physical Medicine & Rehabilitation

## 2015-10-06 ENCOUNTER — Encounter: Payer: Medicaid Other | Attending: Physical Medicine & Rehabilitation | Admitting: Physical Medicine & Rehabilitation

## 2015-10-06 VITALS — BP 113/56 | HR 76 | Resp 17

## 2015-10-06 DIAGNOSIS — G8250 Quadriplegia, unspecified: Secondary | ICD-10-CM | POA: Diagnosis not present

## 2015-10-06 DIAGNOSIS — M62838 Other muscle spasm: Secondary | ICD-10-CM | POA: Insufficient documentation

## 2015-10-06 DIAGNOSIS — L709 Acne, unspecified: Secondary | ICD-10-CM

## 2015-10-06 MED ORDER — CLINDAMYCIN PHOS-BENZOYL PEROX 1-5 % EX GEL: Freq: Two times a day (BID) | CUTANEOUS | Status: DC

## 2015-10-06 NOTE — Patient Instructions (Signed)
PLEASE CALL ME WITH ANY PROBLEMS OR QUESTIONS (#[phone]).  HAVE A GOOD DAY!    

## 2015-10-06 NOTE — Progress Notes (Signed)
Phenol neurolysis of the BILATERAL tibial nerves  Indication: Severe spasticity in the plantar flexor muscles which is not responding to medical management and other conservative care and interfering with functional use.  Informed consent was obtained after describing the risks and benefits of the procedure with the patient this includes bleeding bruising and infection as well as medication side effects. The patient elected to proceed and has given written consent. Patient placed in a prone position on the exam table. External DC stimulation was applied to the popliteal space using a nerve stimulator. Plantar flexion twitch was obtained. The popliteal region was prepped with Betadine and then entered with a 22-gauge 40 mm needle electrode under electrical stimulation guidance. Plantar flexion which was obtained and confirmed. Then 5 cc of 5% phenol were injected. The patient tolerated procedure well. Post procedure instructions and followup visit were given.  Will pursue botox injections 500u to LUE at next visit. Will also make a referral to [facility] PT to address LE tone, ROM, mobility

## 2015-10-07 ENCOUNTER — Other Ambulatory Visit: Payer: Self-pay | Admitting: Physical Medicine & Rehabilitation

## 2015-10-27 ENCOUNTER — Other Ambulatory Visit: Payer: Self-pay | Admitting: Physical Medicine & Rehabilitation

## 2015-11-03 ENCOUNTER — Other Ambulatory Visit: Payer: Self-pay | Admitting: Physical Medicine & Rehabilitation

## 2015-11-06 NOTE — Telephone Encounter (Signed)
Can you please clarify dose for Tegretol? The last office visit note and the med list are different in sigs. Thanks!

## 2015-11-15 ENCOUNTER — Encounter: Payer: Medicaid Other | Admitting: Physical Medicine & Rehabilitation

## 2015-11-16 ENCOUNTER — Ambulatory Visit: Payer: Self-pay | Admitting: Physical Medicine & Rehabilitation

## 2015-11-27 ENCOUNTER — Other Ambulatory Visit: Payer: Self-pay | Admitting: Physical Medicine & Rehabilitation

## 2015-12-13 ENCOUNTER — Encounter: Payer: Medicaid Other | Admitting: Physical Medicine & Rehabilitation

## 2015-12-25 ENCOUNTER — Other Ambulatory Visit: Payer: Self-pay | Admitting: Physical Medicine & Rehabilitation

## 2015-12-27 ENCOUNTER — Encounter: Payer: Self-pay | Admitting: Physical Medicine & Rehabilitation

## 2015-12-27 ENCOUNTER — Encounter: Payer: Medicaid Other | Attending: Physical Medicine & Rehabilitation | Admitting: Physical Medicine & Rehabilitation

## 2015-12-27 VITALS — BP 107/72 | HR 73 | Resp 14

## 2015-12-27 DIAGNOSIS — M62838 Other muscle spasm: Secondary | ICD-10-CM | POA: Insufficient documentation

## 2015-12-27 DIAGNOSIS — G8250 Quadriplegia, unspecified: Secondary | ICD-10-CM

## 2015-12-27 NOTE — Patient Instructions (Signed)
  PLEASE CALL ME WITH ANY PROBLEMS OR QUESTIONS (#[phone]).      

## 2015-12-27 NOTE — Progress Notes (Signed)
Botox Injection for spasticity using needle EMG guidance Indication: spastic tetraplegia. G82.50  Dilution: 100 Units/ml        Total Units Injected: 500 Indication: Severe spasticity which interferes with ADL,mobility and/or  hygiene and is unresponsive to medication management and other conservative care Informed consent was obtained after describing risks and benefits of the procedure with the patient. This includes bleeding, bruising, infection, excessive weakness, or medication side effects. A REMS form is on file and signed.  Needle: 25g 1 1/2" needle. I did not utilize EMG guidance due to patient's agitated and disinhibited state  LUE Number of units per muscle Pectoralis Major 0 units Pectoralis Minor 0 units Biceps and brachialis 200 units, 4 access points Brachioradialis 0 units FCR 25 units FCU 25 units FDS 100 units FDP 100 units FPL 0 units Pronator Teres 50 units

## 2016-01-08 ENCOUNTER — Other Ambulatory Visit: Payer: Self-pay | Admitting: Physical Medicine & Rehabilitation

## 2016-01-08 NOTE — Telephone Encounter (Signed)
Electronic rx request received for tramadol refill, Please advise

## 2016-01-09 NOTE — Telephone Encounter (Signed)
rec'd fax request from [place] for tramadol refill.Marland KitchenMarland KitchenMarland KitchenMarland Kitchenplease advise......last fill 07/2015

## 2016-01-10 ENCOUNTER — Other Ambulatory Visit: Payer: Self-pay | Admitting: *Deleted

## 2016-01-10 MED ORDER — TRAMADOL HCL 50 MG PO TABS: 50.0000 mg | ORAL_TABLET | Freq: Four times a day (QID) | ORAL | Status: DC | PRN

## 2016-01-22 ENCOUNTER — Other Ambulatory Visit: Payer: Self-pay | Admitting: Physical Medicine & Rehabilitation

## 2016-01-24 ENCOUNTER — Telehealth: Payer: Self-pay | Admitting: *Deleted

## 2016-01-24 NOTE — Telephone Encounter (Signed)
Yearly required Safety documentation for Seroquel (Quetiapine) 50 mg #150/mo done over phone with [employer].  Approval [id number]  01/24/16-01/18/17  (scanned under media tab)

## 2016-02-05 ENCOUNTER — Other Ambulatory Visit: Payer: Self-pay | Admitting: Physical Medicine & Rehabilitation

## 2016-02-05 NOTE — Telephone Encounter (Signed)
Do you want to refill these medications?  Haven been mentioned in the past year in notes.

## 2016-02-07 ENCOUNTER — Other Ambulatory Visit: Payer: Self-pay | Admitting: *Deleted

## 2016-02-08 MED ORDER — LORAZEPAM 0.5 MG PO TABS: ORAL_TABLET | ORAL | Status: DC

## 2016-02-08 NOTE — Addendum Note (Signed)
Addended by: Caro Hight on: 02/08/2016 11:47 AM   Modules accepted: Orders

## 2016-02-20 ENCOUNTER — Other Ambulatory Visit: Payer: Self-pay | Admitting: Physical Medicine & Rehabilitation

## 2016-02-28 ENCOUNTER — Telehealth: Payer: Self-pay | Admitting: *Deleted

## 2016-02-28 NOTE — Telephone Encounter (Signed)
Called [employer], verified that Dantrolene sodium prior authorization was approved. Contacted pharmacy, approval was confirmed. Pharmacy contacted patient.  Documentation was sent to scan center for upload under media tab

## 2016-03-12 ENCOUNTER — Other Ambulatory Visit: Payer: Self-pay | Admitting: Physical Medicine & Rehabilitation

## 2016-03-12 NOTE — Telephone Encounter (Signed)
Trazodone has not been ordered since last year.  Do you want to approve?

## 2016-03-15 ENCOUNTER — Other Ambulatory Visit: Payer: Self-pay | Admitting: *Deleted

## 2016-03-15 MED ORDER — TRAZODONE HCL 50 MG PO TABS: ORAL_TABLET | ORAL | Status: DC

## 2016-03-15 MED ORDER — LORAZEPAM 0.5 MG PO TABS: ORAL_TABLET | ORAL | Status: DC

## 2016-03-25 ENCOUNTER — Other Ambulatory Visit: Payer: Self-pay | Admitting: Physical Medicine & Rehabilitation

## 2016-03-25 NOTE — Telephone Encounter (Signed)
Please verify dose. Your note says 300 mg tid on 03/22/2015

## 2016-03-27 ENCOUNTER — Encounter: Payer: Self-pay | Admitting: Physical Medicine & Rehabilitation

## 2016-03-27 ENCOUNTER — Encounter: Payer: Medicaid Other | Attending: Physical Medicine & Rehabilitation | Admitting: Physical Medicine & Rehabilitation

## 2016-03-27 VITALS — BP 98/68 | HR 63

## 2016-03-27 DIAGNOSIS — L709 Acne, unspecified: Secondary | ICD-10-CM

## 2016-03-27 DIAGNOSIS — G8250 Quadriplegia, unspecified: Secondary | ICD-10-CM

## 2016-03-27 DIAGNOSIS — M62838 Other muscle spasm: Secondary | ICD-10-CM | POA: Insufficient documentation

## 2016-03-27 MED ORDER — CLINDAMYCIN PHOS-BENZOYL PEROX 1-5 % EX GEL: Freq: Two times a day (BID) | CUTANEOUS | Status: DC

## 2016-03-27 NOTE — Patient Instructions (Signed)
PLEASE CALL ME WITH ANY PROBLEMS OR QUESTIONS [phone]).     IF YOU CAN FIND HER ORTHO PHYSICIAN, WE COULD MAKE A REFERRAL FOR ASSESSMENT OF THE HARDWARE AT HER ELBOW

## 2016-03-27 NOTE — Progress Notes (Signed)
Botox Injection for spasticity using needle EMG guidance Indication: G82.50  Dilution: 100 Units/ml        Total Units Injected: 500 Indication: Severe spasticity which interferes with ADL,mobility and/or  hygiene and is unresponsive to medication management and other conservative care Informed consent was obtained after describing risks and benefits of the procedure with the patient. This includes bleeding, bruising, infection, excessive weakness, or medication side effects. A REMS form is on file and signed.  Needle: 54mm 25g needle  Number of units per muscle Pectoralis Major 0 units Pectoralis Minor 0 units Biceps/Brachialis 250 units. 5 access points Brachioradialis 0 units FCR 25 units FCU 25 units FDS 100 units FDP 100 units FPL 0 units Pronator Teres 0 units Pronator Quadratus 0 units Quadriceps 0 units Gastroc/soleus 0 units Hamstrings 0 units Tibialis Posterior 0 units EHL 0 units The patient tolerated the procedure well. Post procedure instructions were given. A followup appointment was made. Mother will call in two months to let me know where Glossie is having the most problems and we will target next round of botox there. She has made gains with ROM/positioning with the lower extermity injections earlier this year. Family will check into the orthopedic specialist who saw her while in the hospital.

## 2016-04-15 ENCOUNTER — Other Ambulatory Visit: Payer: Self-pay

## 2016-04-15 MED ORDER — LORAZEPAM 0.5 MG PO TABS: ORAL_TABLET | ORAL | Status: DC

## 2016-04-15 MED ORDER — TRAZODONE HCL 50 MG PO TABS: ORAL_TABLET | ORAL | Status: DC

## 2016-04-15 NOTE — Telephone Encounter (Signed)
Received fax. I have sent in Rx. I have called in Lorazepam.

## 2016-04-22 DIAGNOSIS — Z87440 Personal history of urinary (tract) infections: Secondary | ICD-10-CM | POA: Diagnosis not present

## 2016-04-22 DIAGNOSIS — M6281 Muscle weakness (generalized): Secondary | ICD-10-CM | POA: Diagnosis not present

## 2016-04-29 DIAGNOSIS — Z87440 Personal history of urinary (tract) infections: Secondary | ICD-10-CM | POA: Diagnosis not present

## 2016-04-29 DIAGNOSIS — M6281 Muscle weakness (generalized): Secondary | ICD-10-CM | POA: Diagnosis not present

## 2016-05-03 ENCOUNTER — Telehealth: Payer: Self-pay

## 2016-05-03 NOTE — Telephone Encounter (Signed)
Pt's mother called to get orders for PT, OT, and ST. She has not heard anything from [facility] yet to set up visits. She states that she does not want rehab to fall through and would like this expedited.   However, Geographical information systems officer with [facility]- called in regards to this. She states that the pt does not have a qualified dx code for Medicaid to cover therapy. Pt would have to pay out of pocket for visits.   Is there anything we can do to help pt receive services? Please advise.

## 2016-05-07 NOTE — Telephone Encounter (Signed)
Left message with Anderson Malta and [name] to return call.

## 2016-05-07 NOTE — Telephone Encounter (Signed)
[facility] would have to donate services to [facility]. We could perhaps check with [facility] to see if there is anything the health system can offer. It's an unfortunate situation with medicaid and therapy services.

## 2016-05-14 ENCOUNTER — Other Ambulatory Visit: Payer: Self-pay | Admitting: Family Medicine

## 2016-05-14 MED ORDER — LORAZEPAM 0.5 MG PO TABS: ORAL_TABLET | ORAL | Status: DC

## 2016-05-27 ENCOUNTER — Other Ambulatory Visit: Payer: Self-pay | Admitting: Physical Medicine & Rehabilitation

## 2016-05-29 ENCOUNTER — Other Ambulatory Visit: Payer: Self-pay | Admitting: Physical Medicine & Rehabilitation

## 2016-06-17 ENCOUNTER — Other Ambulatory Visit: Payer: Self-pay | Admitting: *Deleted

## 2016-06-17 MED ORDER — LORAZEPAM 0.5 MG PO TABS: ORAL_TABLET | ORAL | Status: DC

## 2016-06-23 DIAGNOSIS — Z87440 Personal history of urinary (tract) infections: Secondary | ICD-10-CM | POA: Diagnosis not present

## 2016-06-23 DIAGNOSIS — M6281 Muscle weakness (generalized): Secondary | ICD-10-CM | POA: Diagnosis not present

## 2016-06-25 ENCOUNTER — Other Ambulatory Visit: Payer: Self-pay | Admitting: Physical Medicine & Rehabilitation

## 2016-06-26 ENCOUNTER — Other Ambulatory Visit: Payer: Self-pay | Admitting: Physical Medicine & Rehabilitation

## 2016-07-02 ENCOUNTER — Encounter: Payer: Self-pay | Admitting: Physical Medicine & Rehabilitation

## 2016-07-02 ENCOUNTER — Encounter: Payer: Medicare Other | Attending: Physical Medicine & Rehabilitation | Admitting: Physical Medicine & Rehabilitation

## 2016-07-02 DIAGNOSIS — M62838 Other muscle spasm: Secondary | ICD-10-CM | POA: Diagnosis not present

## 2016-07-02 DIAGNOSIS — G8250 Quadriplegia, unspecified: Secondary | ICD-10-CM

## 2016-07-02 NOTE — Patient Instructions (Signed)
PLEASE CALL ME WITH ANY PROBLEMS OR QUESTIONS ([phone])  

## 2016-07-02 NOTE — Progress Notes (Signed)
Botox Injection for spasticity using needle EMG guidance Indication: Spastic tetraplegia (HCC)     Dilution: 100 Units/ml        Total Units Injected: 500 Indication: Severe spasticity which interferes with ADL,mobility and/or  hygiene and is unresponsive to medication management and other conservative care Informed consent was obtained after describing risks and benefits of the procedure with the patient. This includes bleeding, bruising, infection, excessive weakness, or medication side effects. A REMS form is on file and signed.  Needle: 51mm injectable monopolar needle electrode  Number of units per muscle Pectoralis Major 50 units Pectoralis Minor 50 units Biceps 100 units Brachioradialis 150 units FCR 0 units FCU 0 units FDS 75 units FDP 75 units FPL 0 units Pronator Teres 0 units Pronator Quadratus 0 units  All injections were done after obtaining appropriate EMG activity and after negative drawback for blood. The patient tolerated the procedure well. Post procedure instructions were given. Return in about 3 months (around 10/02/2016).

## 2016-07-08 ENCOUNTER — Telehealth: Payer: Self-pay

## 2016-07-08 NOTE — Telephone Encounter (Signed)
Deb from [facility] called to make sure that the pt's referral was being completed. ST,OT,and PT are being ordered. She states that she needs demographics, med list, and last OV. Spoke and verified info with [employer].

## 2016-07-10 DIAGNOSIS — S062X9S Diffuse traumatic brain injury with loss of consciousness of unspecified duration, sequela: Secondary | ICD-10-CM | POA: Diagnosis not present

## 2016-07-10 DIAGNOSIS — G8250 Quadriplegia, unspecified: Secondary | ICD-10-CM | POA: Diagnosis not present

## 2016-07-12 DIAGNOSIS — S062X9S Diffuse traumatic brain injury with loss of consciousness of unspecified duration, sequela: Secondary | ICD-10-CM | POA: Diagnosis not present

## 2016-07-12 DIAGNOSIS — G8250 Quadriplegia, unspecified: Secondary | ICD-10-CM | POA: Diagnosis not present

## 2016-07-15 ENCOUNTER — Telehealth: Payer: Self-pay | Admitting: Physical Medicine & Rehabilitation

## 2016-07-15 NOTE — Telephone Encounter (Signed)
[name] OT with [name] needs to get verbal orders for patient 1w3, 0w1, 1w1, 0w1 and 1w1.  Please call him at [phone].

## 2016-07-16 ENCOUNTER — Telehealth: Payer: Self-pay | Admitting: Physical Medicine & Rehabilitation

## 2016-07-16 DIAGNOSIS — G8250 Quadriplegia, unspecified: Secondary | ICD-10-CM | POA: Diagnosis not present

## 2016-07-16 DIAGNOSIS — S062X9S Diffuse traumatic brain injury with loss of consciousness of unspecified duration, sequela: Secondary | ICD-10-CM | POA: Diagnosis not present

## 2016-07-16 NOTE — Telephone Encounter (Signed)
Don notified of approval

## 2016-07-16 NOTE — Telephone Encounter (Signed)
Patients mother is calling to request that an aide come out for 28hrs a week to help her out.  She has stated that Medicare is going to pay for this and [name] will provide the aide to come out to her house.  Any questions please call mother.

## 2016-07-17 ENCOUNTER — Telehealth: Payer: Self-pay | Admitting: Physical Medicine & Rehabilitation

## 2016-07-17 NOTE — Telephone Encounter (Signed)
[place] with [name] needs verbal to change the patients start of care from August 4 to August 11.  Please call her at [phone].

## 2016-07-17 NOTE — Telephone Encounter (Signed)
Verbal orders given per office protocol. 

## 2016-07-18 DIAGNOSIS — G8250 Quadriplegia, unspecified: Secondary | ICD-10-CM | POA: Diagnosis not present

## 2016-07-18 DIAGNOSIS — S062X9S Diffuse traumatic brain injury with loss of consciousness of unspecified duration, sequela: Secondary | ICD-10-CM | POA: Diagnosis not present

## 2016-07-19 DIAGNOSIS — S062X9S Diffuse traumatic brain injury with loss of consciousness of unspecified duration, sequela: Secondary | ICD-10-CM | POA: Diagnosis not present

## 2016-07-19 DIAGNOSIS — G8250 Quadriplegia, unspecified: Secondary | ICD-10-CM | POA: Diagnosis not present

## 2016-07-22 ENCOUNTER — Telehealth: Payer: Self-pay | Admitting: Physical Medicine & Rehabilitation

## 2016-07-22 NOTE — Telephone Encounter (Signed)
Order placed for referral to [facility] for aide respite care 28 hours week through [facility]. Hopefully this will capture what she is requesting

## 2016-07-22 NOTE — Telephone Encounter (Signed)
[place] with Landry Corporal needs to get a verbal for 5 visits with patient.  Please call her at [phone].

## 2016-07-22 NOTE — Telephone Encounter (Signed)
Approval given

## 2016-07-22 NOTE — Telephone Encounter (Signed)
Is she asking that we write a letter? What actually needs to be done to help achieve this?

## 2016-07-23 DIAGNOSIS — G8250 Quadriplegia, unspecified: Secondary | ICD-10-CM | POA: Diagnosis not present

## 2016-07-23 DIAGNOSIS — S062X9S Diffuse traumatic brain injury with loss of consciousness of unspecified duration, sequela: Secondary | ICD-10-CM | POA: Diagnosis not present

## 2016-07-24 DIAGNOSIS — S062X9S Diffuse traumatic brain injury with loss of consciousness of unspecified duration, sequela: Secondary | ICD-10-CM | POA: Diagnosis not present

## 2016-07-24 DIAGNOSIS — G8250 Quadriplegia, unspecified: Secondary | ICD-10-CM | POA: Diagnosis not present

## 2016-07-26 DIAGNOSIS — G8250 Quadriplegia, unspecified: Secondary | ICD-10-CM | POA: Diagnosis not present

## 2016-07-26 DIAGNOSIS — S062X9S Diffuse traumatic brain injury with loss of consciousness of unspecified duration, sequela: Secondary | ICD-10-CM | POA: Diagnosis not present

## 2016-07-29 ENCOUNTER — Other Ambulatory Visit: Payer: Self-pay | Admitting: Physical Medicine & Rehabilitation

## 2016-07-30 DIAGNOSIS — G8250 Quadriplegia, unspecified: Secondary | ICD-10-CM | POA: Diagnosis not present

## 2016-07-30 DIAGNOSIS — S062X9S Diffuse traumatic brain injury with loss of consciousness of unspecified duration, sequela: Secondary | ICD-10-CM | POA: Diagnosis not present

## 2016-08-01 DIAGNOSIS — S062X9S Diffuse traumatic brain injury with loss of consciousness of unspecified duration, sequela: Secondary | ICD-10-CM | POA: Diagnosis not present

## 2016-08-01 DIAGNOSIS — G8250 Quadriplegia, unspecified: Secondary | ICD-10-CM | POA: Diagnosis not present

## 2016-08-02 DIAGNOSIS — S062X9S Diffuse traumatic brain injury with loss of consciousness of unspecified duration, sequela: Secondary | ICD-10-CM | POA: Diagnosis not present

## 2016-08-02 DIAGNOSIS — G8250 Quadriplegia, unspecified: Secondary | ICD-10-CM | POA: Diagnosis not present

## 2016-08-05 DIAGNOSIS — S062X9S Diffuse traumatic brain injury with loss of consciousness of unspecified duration, sequela: Secondary | ICD-10-CM | POA: Diagnosis not present

## 2016-08-05 DIAGNOSIS — G8250 Quadriplegia, unspecified: Secondary | ICD-10-CM | POA: Diagnosis not present

## 2016-08-06 DIAGNOSIS — G8250 Quadriplegia, unspecified: Secondary | ICD-10-CM | POA: Diagnosis not present

## 2016-08-06 DIAGNOSIS — S062X9S Diffuse traumatic brain injury with loss of consciousness of unspecified duration, sequela: Secondary | ICD-10-CM | POA: Diagnosis not present

## 2016-08-13 DIAGNOSIS — G8250 Quadriplegia, unspecified: Secondary | ICD-10-CM | POA: Diagnosis not present

## 2016-08-13 DIAGNOSIS — S062X9S Diffuse traumatic brain injury with loss of consciousness of unspecified duration, sequela: Secondary | ICD-10-CM | POA: Diagnosis not present

## 2016-08-15 ENCOUNTER — Other Ambulatory Visit: Payer: Self-pay | Admitting: Physical Medicine & Rehabilitation

## 2016-08-17 ENCOUNTER — Other Ambulatory Visit: Payer: Self-pay | Admitting: Physical Medicine & Rehabilitation

## 2016-08-20 DIAGNOSIS — S062X9S Diffuse traumatic brain injury with loss of consciousness of unspecified duration, sequela: Secondary | ICD-10-CM | POA: Diagnosis not present

## 2016-08-20 DIAGNOSIS — G8250 Quadriplegia, unspecified: Secondary | ICD-10-CM | POA: Diagnosis not present

## 2016-08-21 DIAGNOSIS — G8250 Quadriplegia, unspecified: Secondary | ICD-10-CM | POA: Diagnosis not present

## 2016-08-21 DIAGNOSIS — S062X9S Diffuse traumatic brain injury with loss of consciousness of unspecified duration, sequela: Secondary | ICD-10-CM | POA: Diagnosis not present

## 2016-08-23 ENCOUNTER — Telehealth: Payer: Self-pay | Admitting: Physical Medicine & Rehabilitation

## 2016-08-23 NOTE — Telephone Encounter (Signed)
Approval given

## 2016-08-23 NOTE — Telephone Encounter (Signed)
Left message for [name] to call back with PT plan of care for approval

## 2016-08-23 NOTE — Telephone Encounter (Signed)
[name] with [name] returned [name]'s call.  He would like to see patient 1w8 for strengthening upper body, balance and transfer techniques.  Please call him at [phone].

## 2016-08-23 NOTE — Telephone Encounter (Signed)
[name] PT with [name] needs to get verbal orders for therapy.  Please call him at [phone].

## 2016-08-24 DIAGNOSIS — S062X9S Diffuse traumatic brain injury with loss of consciousness of unspecified duration, sequela: Secondary | ICD-10-CM | POA: Diagnosis not present

## 2016-08-24 DIAGNOSIS — G8250 Quadriplegia, unspecified: Secondary | ICD-10-CM | POA: Diagnosis not present

## 2016-08-26 ENCOUNTER — Telehealth: Payer: Self-pay | Admitting: Physical Medicine & Rehabilitation

## 2016-08-26 NOTE — Telephone Encounter (Signed)
[name] OT with [name] needs to get verbal to extend patient 1 more visit week of 09/01/16 and continue with new certification period starting week of 10/1.  Patient also needs a referral for a left hand splint for contractures.  If someone could please call him at [phone].

## 2016-08-26 NOTE — Telephone Encounter (Signed)
Ok given for visit extension.  I will forward message about left hand splint to Dr [name] (see previous message)

## 2016-08-27 DIAGNOSIS — G8250 Quadriplegia, unspecified: Secondary | ICD-10-CM | POA: Diagnosis not present

## 2016-08-27 DIAGNOSIS — S062X9S Diffuse traumatic brain injury with loss of consciousness of unspecified duration, sequela: Secondary | ICD-10-CM | POA: Diagnosis not present

## 2016-08-27 NOTE — Telephone Encounter (Signed)
Doesn't she already have one of these???  If not, I have written an rx to be faxed to [place].

## 2016-08-30 DIAGNOSIS — S062X9S Diffuse traumatic brain injury with loss of consciousness of unspecified duration, sequela: Secondary | ICD-10-CM | POA: Diagnosis not present

## 2016-08-30 DIAGNOSIS — G8250 Quadriplegia, unspecified: Secondary | ICD-10-CM | POA: Diagnosis not present

## 2016-09-02 ENCOUNTER — Telehealth: Payer: Self-pay | Admitting: Physical Medicine & Rehabilitation

## 2016-09-02 NOTE — Telephone Encounter (Signed)
[name] with [name] needs to get verbal orders to get 6 more visits with patient and this would be re certifying her.  Please call Kim at [phone].

## 2016-09-02 NOTE — Telephone Encounter (Signed)
[name] OT with [name] is returning a call to [facility] about an orthotic referral for a hand splint.  Please call him at [phone].

## 2016-09-03 DIAGNOSIS — G8250 Quadriplegia, unspecified: Secondary | ICD-10-CM | POA: Diagnosis not present

## 2016-09-03 DIAGNOSIS — S062X9S Diffuse traumatic brain injury with loss of consciousness of unspecified duration, sequela: Secondary | ICD-10-CM | POA: Diagnosis not present

## 2016-09-03 NOTE — Telephone Encounter (Signed)
I left message on [name]'s VM that according to Dr [name] note he wrote an RX for it to be faxed to [place] last week

## 2016-09-03 NOTE — Telephone Encounter (Signed)
Verbal orders given  

## 2016-09-03 NOTE — Telephone Encounter (Signed)
Contacted speech therapist, went to voicemail, voicemail is not secure, left message for speech therapist to call us back

## 2016-09-05 DIAGNOSIS — G8250 Quadriplegia, unspecified: Secondary | ICD-10-CM | POA: Diagnosis not present

## 2016-09-05 DIAGNOSIS — S062X9S Diffuse traumatic brain injury with loss of consciousness of unspecified duration, sequela: Secondary | ICD-10-CM | POA: Diagnosis not present

## 2016-09-07 DIAGNOSIS — S062X9S Diffuse traumatic brain injury with loss of consciousness of unspecified duration, sequela: Secondary | ICD-10-CM | POA: Diagnosis not present

## 2016-09-07 DIAGNOSIS — G8250 Quadriplegia, unspecified: Secondary | ICD-10-CM | POA: Diagnosis not present

## 2016-09-08 DIAGNOSIS — G8250 Quadriplegia, unspecified: Secondary | ICD-10-CM | POA: Diagnosis not present

## 2016-09-08 DIAGNOSIS — S062X9S Diffuse traumatic brain injury with loss of consciousness of unspecified duration, sequela: Secondary | ICD-10-CM | POA: Diagnosis not present

## 2016-09-09 ENCOUNTER — Telehealth: Payer: Self-pay | Admitting: Physical Medicine & Rehabilitation

## 2016-09-09 NOTE — Telephone Encounter (Signed)
Approval given

## 2016-09-09 NOTE — Telephone Encounter (Signed)
[name] with [name] needs to get verbal orders to continue ST.  Please call her at [phone].

## 2016-09-10 ENCOUNTER — Telehealth: Payer: Self-pay | Admitting: Physical Medicine & Rehabilitation

## 2016-09-10 NOTE — Telephone Encounter (Signed)
needs a verbal OK for patients plan of care - please call DON at [phone] to answer any questions.... Wants to see patient for dressings, and home exercise, and will discharge patient when finished with tasks in the plan of care.

## 2016-09-10 NOTE — Telephone Encounter (Signed)
1wk3 approved

## 2016-09-11 ENCOUNTER — Telehealth: Payer: Self-pay

## 2016-09-11 DIAGNOSIS — G8250 Quadriplegia, unspecified: Secondary | ICD-10-CM | POA: Diagnosis not present

## 2016-09-11 DIAGNOSIS — S062X9S Diffuse traumatic brain injury with loss of consciousness of unspecified duration, sequela: Secondary | ICD-10-CM | POA: Diagnosis not present

## 2016-09-11 MED ORDER — QUETIAPINE FUMARATE 100 MG PO TABS: 100.0000 mg | ORAL_TABLET | Freq: Two times a day (BID) | ORAL | 4 refills | Status: DC

## 2016-09-11 NOTE — Telephone Encounter (Signed)
rx written. rx is changed to comply with MCD

## 2016-09-11 NOTE — Telephone Encounter (Signed)
Called and left message for the father and mother about switching Quetiapine 50 mg TID to Quetiapine 100 mg BID.Marland Kitchen

## 2016-09-11 NOTE — Telephone Encounter (Signed)
Prior Authorization request sent to [facility] waiting for response.

## 2016-09-11 NOTE — Telephone Encounter (Signed)
Spoke with [facility] and was suggested to switch Quetiapine 50 mg to 100mg  per your request. Will be  approve for Quetiapine 100mg  BID once Rx is sent to pharmacy. Please advise

## 2016-09-12 ENCOUNTER — Telehealth: Payer: Self-pay

## 2016-09-12 DIAGNOSIS — G8250 Quadriplegia, unspecified: Secondary | ICD-10-CM | POA: Diagnosis not present

## 2016-09-12 DIAGNOSIS — S062X9S Diffuse traumatic brain injury with loss of consciousness of unspecified duration, sequela: Secondary | ICD-10-CM | POA: Diagnosis not present

## 2016-09-12 NOTE — Telephone Encounter (Signed)
PA rx Quetiapine 100 mg bid was approve for insurance [facility]

## 2016-09-14 DIAGNOSIS — G8250 Quadriplegia, unspecified: Secondary | ICD-10-CM | POA: Diagnosis not present

## 2016-09-14 DIAGNOSIS — S062X9S Diffuse traumatic brain injury with loss of consciousness of unspecified duration, sequela: Secondary | ICD-10-CM | POA: Diagnosis not present

## 2016-09-16 ENCOUNTER — Other Ambulatory Visit: Payer: Self-pay | Admitting: Physical Medicine & Rehabilitation

## 2016-09-17 ENCOUNTER — Telehealth: Payer: Self-pay

## 2016-09-17 DIAGNOSIS — G8250 Quadriplegia, unspecified: Secondary | ICD-10-CM | POA: Diagnosis not present

## 2016-09-17 DIAGNOSIS — S062X9S Diffuse traumatic brain injury with loss of consciousness of unspecified duration, sequela: Secondary | ICD-10-CM | POA: Diagnosis not present

## 2016-09-17 NOTE — Telephone Encounter (Signed)
PA for Dantrolene Sodium 50 mg Cap has been approved by [employer]

## 2016-09-19 DIAGNOSIS — S062X9S Diffuse traumatic brain injury with loss of consciousness of unspecified duration, sequela: Secondary | ICD-10-CM | POA: Diagnosis not present

## 2016-09-19 DIAGNOSIS — G8250 Quadriplegia, unspecified: Secondary | ICD-10-CM | POA: Diagnosis not present

## 2016-09-21 DIAGNOSIS — S062X9S Diffuse traumatic brain injury with loss of consciousness of unspecified duration, sequela: Secondary | ICD-10-CM | POA: Diagnosis not present

## 2016-09-21 DIAGNOSIS — G8250 Quadriplegia, unspecified: Secondary | ICD-10-CM | POA: Diagnosis not present

## 2016-09-25 DIAGNOSIS — G8250 Quadriplegia, unspecified: Secondary | ICD-10-CM | POA: Diagnosis not present

## 2016-09-25 DIAGNOSIS — S062X9S Diffuse traumatic brain injury with loss of consciousness of unspecified duration, sequela: Secondary | ICD-10-CM | POA: Diagnosis not present

## 2016-09-26 DIAGNOSIS — G8250 Quadriplegia, unspecified: Secondary | ICD-10-CM | POA: Diagnosis not present

## 2016-09-26 DIAGNOSIS — S062X9S Diffuse traumatic brain injury with loss of consciousness of unspecified duration, sequela: Secondary | ICD-10-CM | POA: Diagnosis not present

## 2016-09-27 DIAGNOSIS — G8250 Quadriplegia, unspecified: Secondary | ICD-10-CM | POA: Diagnosis not present

## 2016-09-27 DIAGNOSIS — S062X9S Diffuse traumatic brain injury with loss of consciousness of unspecified duration, sequela: Secondary | ICD-10-CM | POA: Diagnosis not present

## 2016-09-30 ENCOUNTER — Other Ambulatory Visit: Payer: Self-pay | Admitting: Physical Medicine & Rehabilitation

## 2016-10-01 DIAGNOSIS — S062X9S Diffuse traumatic brain injury with loss of consciousness of unspecified duration, sequela: Secondary | ICD-10-CM | POA: Diagnosis not present

## 2016-10-01 DIAGNOSIS — G8250 Quadriplegia, unspecified: Secondary | ICD-10-CM | POA: Diagnosis not present

## 2016-10-02 ENCOUNTER — Ambulatory Visit: Payer: Medicare Other | Admitting: Physical Medicine & Rehabilitation

## 2016-10-02 ENCOUNTER — Telehealth: Payer: Self-pay | Admitting: *Deleted

## 2016-10-02 ENCOUNTER — Encounter: Payer: Self-pay | Admitting: Physical Medicine & Rehabilitation

## 2016-10-02 NOTE — Telephone Encounter (Signed)
Tommi Emery, OT, [facility] left message asking for verbal order for 1 more visit to address grooming.  Contacted OT and gave verbal orders per office protocol

## 2016-10-04 DIAGNOSIS — G8250 Quadriplegia, unspecified: Secondary | ICD-10-CM | POA: Diagnosis not present

## 2016-10-04 DIAGNOSIS — S062X9S Diffuse traumatic brain injury with loss of consciousness of unspecified duration, sequela: Secondary | ICD-10-CM | POA: Diagnosis not present

## 2016-10-07 ENCOUNTER — Telehealth: Payer: Self-pay

## 2016-10-07 NOTE — Telephone Encounter (Signed)
Elmo Putt from [facility] called in regards to Ms. Pion he stated patient need no further OT services. He can be reached at [phone] if there are any questions or concerns.

## 2016-10-08 DIAGNOSIS — S062X9S Diffuse traumatic brain injury with loss of consciousness of unspecified duration, sequela: Secondary | ICD-10-CM | POA: Diagnosis not present

## 2016-10-08 DIAGNOSIS — G8250 Quadriplegia, unspecified: Secondary | ICD-10-CM | POA: Diagnosis not present

## 2016-10-09 ENCOUNTER — Encounter: Payer: Medicare Other | Admitting: Physical Medicine & Rehabilitation

## 2016-10-10 DIAGNOSIS — G8250 Quadriplegia, unspecified: Secondary | ICD-10-CM | POA: Diagnosis not present

## 2016-10-10 DIAGNOSIS — S062X9S Diffuse traumatic brain injury with loss of consciousness of unspecified duration, sequela: Secondary | ICD-10-CM | POA: Diagnosis not present

## 2016-10-15 DIAGNOSIS — S062X9S Diffuse traumatic brain injury with loss of consciousness of unspecified duration, sequela: Secondary | ICD-10-CM | POA: Diagnosis not present

## 2016-10-15 DIAGNOSIS — G8250 Quadriplegia, unspecified: Secondary | ICD-10-CM | POA: Diagnosis not present

## 2016-10-17 ENCOUNTER — Telehealth: Payer: Self-pay | Admitting: Physical Medicine & Rehabilitation

## 2016-10-24 DIAGNOSIS — S062X9S Diffuse traumatic brain injury with loss of consciousness of unspecified duration, sequela: Secondary | ICD-10-CM | POA: Diagnosis not present

## 2016-10-24 DIAGNOSIS — G8250 Quadriplegia, unspecified: Secondary | ICD-10-CM | POA: Diagnosis not present

## 2016-10-26 DIAGNOSIS — S062X9S Diffuse traumatic brain injury with loss of consciousness of unspecified duration, sequela: Secondary | ICD-10-CM | POA: Diagnosis not present

## 2016-10-26 DIAGNOSIS — G8250 Quadriplegia, unspecified: Secondary | ICD-10-CM | POA: Diagnosis not present

## 2016-10-28 ENCOUNTER — Other Ambulatory Visit: Payer: Self-pay | Admitting: Physical Medicine & Rehabilitation

## 2016-10-29 ENCOUNTER — Encounter: Payer: Self-pay | Admitting: Physical Medicine & Rehabilitation

## 2016-10-29 DIAGNOSIS — G8250 Quadriplegia, unspecified: Secondary | ICD-10-CM | POA: Diagnosis not present

## 2016-10-29 DIAGNOSIS — S062X9S Diffuse traumatic brain injury with loss of consciousness of unspecified duration, sequela: Secondary | ICD-10-CM | POA: Diagnosis not present

## 2016-11-01 DIAGNOSIS — G8250 Quadriplegia, unspecified: Secondary | ICD-10-CM | POA: Diagnosis not present

## 2016-11-01 DIAGNOSIS — S062X9S Diffuse traumatic brain injury with loss of consciousness of unspecified duration, sequela: Secondary | ICD-10-CM | POA: Diagnosis not present

## 2016-11-05 ENCOUNTER — Encounter: Payer: Self-pay | Admitting: Physical Medicine & Rehabilitation

## 2016-11-05 ENCOUNTER — Telehealth: Payer: Self-pay | Admitting: Physical Medicine & Rehabilitation

## 2016-11-05 ENCOUNTER — Encounter: Payer: Medicare Other | Attending: Physical Medicine & Rehabilitation | Admitting: Physical Medicine & Rehabilitation

## 2016-11-05 VITALS — BP 101/69 | HR 71 | Resp 14

## 2016-11-05 DIAGNOSIS — M25512 Pain in left shoulder: Secondary | ICD-10-CM | POA: Insufficient documentation

## 2016-11-05 DIAGNOSIS — K5900 Constipation, unspecified: Secondary | ICD-10-CM | POA: Diagnosis not present

## 2016-11-05 DIAGNOSIS — Z993 Dependence on wheelchair: Secondary | ICD-10-CM | POA: Insufficient documentation

## 2016-11-05 DIAGNOSIS — S069X4S Unspecified intracranial injury with loss of consciousness of 6 hours to 24 hours, sequela: Secondary | ICD-10-CM | POA: Diagnosis not present

## 2016-11-05 DIAGNOSIS — B3731 Acute candidiasis of vulva and vagina: Secondary | ICD-10-CM

## 2016-11-05 DIAGNOSIS — G8250 Quadriplegia, unspecified: Secondary | ICD-10-CM

## 2016-11-05 DIAGNOSIS — Z87891 Personal history of nicotine dependence: Secondary | ICD-10-CM | POA: Insufficient documentation

## 2016-11-05 DIAGNOSIS — R279 Unspecified lack of coordination: Secondary | ICD-10-CM | POA: Diagnosis not present

## 2016-11-05 DIAGNOSIS — B373 Candidiasis of vulva and vagina: Secondary | ICD-10-CM

## 2016-11-05 DIAGNOSIS — Z743 Need for continuous supervision: Secondary | ICD-10-CM | POA: Diagnosis not present

## 2016-11-05 MED ORDER — QUETIAPINE FUMARATE 50 MG PO TABS
50.0000 mg | ORAL_TABLET | ORAL | 3 refills | Status: DC
Start: 2016-11-05 — End: 2017-03-03

## 2016-11-05 MED ORDER — NYSTATIN 100000 UNIT/GM EX POWD: Freq: Two times a day (BID) | CUTANEOUS | 0 refills | Status: DC

## 2016-11-05 MED ORDER — HYDROCODONE-ACETAMINOPHEN 5-325 MG PO TABS: 1.0000 | ORAL_TABLET | Freq: Two times a day (BID) | ORAL | 0 refills | Status: DC | PRN

## 2016-11-05 NOTE — Patient Instructions (Addendum)
USE THE HYDROCODONE 30 MINUTES PRIOR TO THERAPY/STRETCHING  kEEP LEFT SHOULDER/ARM ELEVATED AS POSSIBLE. YOU CAN USE A SLING ON A LIMITED BASIS.   PLEASE CALL ME WITH ANY PROBLEMS OR QUESTIONS [phone]   HAPPY HOLIDAYS!!!!                    *                * *             *   *   *         *  *   *  *  *     *  *  *  *  *  *  * *  *  *  *  *  *  *  *  *  * *               *  *               *  *               *  *

## 2016-11-05 NOTE — Telephone Encounter (Signed)
Verbal orders given  

## 2016-11-05 NOTE — Telephone Encounter (Signed)
needs verbal order to extend speech therapy for patient - please call

## 2016-11-05 NOTE — Progress Notes (Signed)
Subjective:    Patient ID: [name], female    DOB: 08/30/1981, 35 y.o.   MRN: [id number]  HPI   Kimani is here in follow up of her spastic tetraplegia. Mom and dad have questions about her left shoulder and lower extremities and what "else can be done." Mom feels that she's more able to engage with her right arm. She is able to use the arm to helip with transfers. The left shoulder/arm remain problematic. She has pain in the left shoulder with passive movement, hygiene, etc. The left humerus is known to be subluxed.   Mom has adjusted with the seroquel giving her half pills during the day. It is hard for her however to cut them accurately and the decrease at night has affected her sleep at times.   Mom notes occasional yeast in her groin and around her vagina and is asking about an antifungal cream.   PO intake has been stable. Skin has generally been intact. Mom works hard to keep watch of her skin.   Exercises currently consist of HEP which mom and dad help with.    Pain Inventory Average Pain 3 Pain Right Now 2 My pain is dull, tingling and aching  In the last 24 hours, has pain interfered with the following? General activity 3 Relation with others 2 Enjoyment of life 3 What TIME of day is your pain at its worst? evening Sleep (in general) Fair  Pain is worse with: inactivity and some activites Pain improves with: therapy/exercise Relief from Meds: 3  Mobility how many minutes can you walk? 0 ability to climb steps?  no do you drive?  no use a wheelchair needs help with transfers transfers alone Do you have any goals in this area?  yes  Function disabled: date disabled . I need assistance with the following:  feeding, dressing, bathing, toileting, meal prep, household duties and shopping Do you have any goals in this area?  yes  Neuro/Psych tingling confusion depression anxiety  Prior Studies Any changes since last visit?  no  Physicians  involved in your care Any changes since last visit?  no   History reviewed. No pertinent family history. Social History   Social History  . Marital status: Divorced    Spouse name: N/A  . Number of children: N/A  . Years of education: N/A   Social History Main Topics  . Smoking status: Former Smoker    Packs/day: 0.25    Years: 16.00    Types: Cigarettes    Quit date: 01/04/2014  . Smokeless tobacco: Never Used  . Alcohol use Yes     Comment: occasional drinker  . Drug use: No  . Sexual activity: Not Asked   Other Topics Concern  . None   Social History Narrative  . None   Past Surgical History:  Procedure Laterality Date  . ABDOMINAL HYSTERECTOMY    . ORIF ELBOW FRACTURE Left 01/04/2014   Procedure: OPEN REDUCTION INTERNAL FIXATION (ORIF) ELBOW/OLECRANON FRACTURE;  Surgeon: Alta Corning, MD;  Location: [place];  Service: Orthopedics;  Laterality: Left;  . ORIF HUMERUS FRACTURE Left 01/04/2014   Procedure: OPEN REDUCTION INTERNAL FIXATION (ORIF) HUMERAL SHAFT FRACTURE;  Surgeon: Alta Corning, MD;  Location: [place];  Service: Orthopedics;  Laterality: Left;  . ORIF RADIAL FRACTURE Left 01/04/2014   Procedure: OPEN REDUCTION INTERNAL FIXATION (ORIF) RADIAL FRACTURE;  Surgeon: Alta Corning, MD;  Location: [place];  Service: Orthopedics;  Laterality: Left;  .  ORIF ULNAR FRACTURE Left 01/04/2014   Procedure: OPEN REDUCTION INTERNAL FIXATION (ORIF) ULNAR FRACTURE;  Surgeon: Alta Corning, MD;  Location: [place];  Service: Orthopedics;  Laterality: Left;  . PEG PLACEMENT N/A 01/12/2014   Procedure: PERCUTANEOUS ENDOSCOPIC GASTROSTOMY (PEG) PLACEMENT;  Surgeon: Gwenyth Ober, MD;  Location: [place];  Service: General;  Laterality: N/A;    bedside trach and peg  . PERCUTANEOUS TRACHEOSTOMY N/A 01/12/2014   Procedure: PERCUTANEOUS TRACHEOSTOMY;  Surgeon: Gwenyth Ober, MD;  Location: [place];  Service: General;  Laterality: N/A;  BEDSIDE TRACH   History reviewed. No pertinent past medical  history. BP 101/69 (BP Location: Right Arm, Patient Position: Supine, Cuff Size: Large)   Pulse 71   Resp 14   SpO2 96%   Opioid Risk Score:   Fall Risk Score:  `1  Depression screen PHQ 2/9  No flowsheet data found.  Review of Systems  Constitutional: Negative.   HENT: Negative.   Respiratory: Negative.   Cardiovascular: Negative.   Gastrointestinal: Positive for constipation.  Endocrine: Negative.   Genitourinary: Negative.   Musculoskeletal: Positive for back pain.  Allergic/Immunologic: Negative.   Neurological:       Tingling  Hematological: Negative.   Psychiatric/Behavioral: Positive for confusion and dysphoric mood. The patient is nervous/anxious.   All other systems reviewed and are negative.      Objective:   Physical Exam  3-4/4 tone left bicep, 2-3/4 left pec major/minor, 3/4 finger and wrist flexors. 4/4 left gastroc/soleus. RLE: 3/4 right hamstring. 3/4 right gastroc/tibialis posterior.  Pt is able to volitionally move left and right leg but has little tolerance still for passive movement of spastic segments.  Speech is clearer and better with conversation. Continues to initatie more and attempt to carry conversation. Asks me questions about her movement and spasticity today.  HRT: RRR Chest: clear Skin: intact, vaginal area not visualized   Assessment & Plan:   1. Will refer again for [facility] PT, SLP to address ROM/cognitoin  2. This patient also requires a personal care attendant at all times.   3. In a month botox to left shoulder and bicep again  5. Maintain dantrium at 50mg  bid and 150mg  qhs  6. Discussed left shoulder at length. Don't recommend sublux sling due to her shoulder/pec tightness.  7. Continue tegretol to 300mg  tid for agitation/ mood lability  8. Constipation: continue daily to bid miralax via tube. Need to be more aggressive. Fiber is ok. rx for florastor   9. Added nystatin cream for vaginal candidiasis  10. Add hydrocodone for pain  relief prior to therapies and before donning and doffing splint  11. Reviewed orthopedic options with family. I believe these are still limited due to her sedentary status and cognition. Can continue to consider moving forward.   25 minutes of face to face patient care time were spent during this visit. All questions were encouraged and answered.

## 2016-11-06 DIAGNOSIS — G8250 Quadriplegia, unspecified: Secondary | ICD-10-CM | POA: Diagnosis not present

## 2016-11-06 DIAGNOSIS — S062X9S Diffuse traumatic brain injury with loss of consciousness of unspecified duration, sequela: Secondary | ICD-10-CM | POA: Diagnosis not present

## 2016-11-07 ENCOUNTER — Telehealth: Payer: Self-pay | Admitting: *Deleted

## 2016-11-07 DIAGNOSIS — S062X9S Diffuse traumatic brain injury with loss of consciousness of unspecified duration, sequela: Secondary | ICD-10-CM | POA: Diagnosis not present

## 2016-11-07 DIAGNOSIS — G8250 Quadriplegia, unspecified: Secondary | ICD-10-CM | POA: Diagnosis not present

## 2016-11-07 NOTE — Telephone Encounter (Signed)
Jacqlyn Larsen , Publishing copy for [employer], called regarding patient.  She explained that they are not able to fulfill the orders that were sent over for PT and OT. She says the patient is maxed out on those two disciplines.  They will be able to proceed with speech therapy.  I asked if this would change following the New Year. She said that the patient has pretty much reached max potential and there is not much more that can be done. She said if medical necessity can be re-established she might be able to receive more therapy, but chances are still slim. FYI

## 2016-11-12 DIAGNOSIS — S062X9S Diffuse traumatic brain injury with loss of consciousness of unspecified duration, sequela: Secondary | ICD-10-CM | POA: Diagnosis not present

## 2016-11-12 DIAGNOSIS — G8250 Quadriplegia, unspecified: Secondary | ICD-10-CM | POA: Diagnosis not present

## 2016-11-21 ENCOUNTER — Encounter: Payer: Self-pay | Admitting: Physical Medicine & Rehabilitation

## 2016-11-21 ENCOUNTER — Telehealth: Payer: Self-pay | Admitting: Physical Medicine & Rehabilitation

## 2016-11-21 DIAGNOSIS — G8250 Quadriplegia, unspecified: Secondary | ICD-10-CM | POA: Diagnosis not present

## 2016-11-21 DIAGNOSIS — S062X9S Diffuse traumatic brain injury with loss of consciousness of unspecified duration, sequela: Secondary | ICD-10-CM | POA: Diagnosis not present

## 2016-11-21 NOTE — Telephone Encounter (Signed)
Patient's mom called to change appointment in January and also to see if Dr. [name] could write a letter for patient.  She needed it for a crafting class that a patient could take at a center, but needed a physical stating she didn't have any type of communicable diseases.  I told the patient's mom  that we don't test for that here and that she would need to get a letter like that from her primary care physician.

## 2016-11-22 ENCOUNTER — Encounter: Payer: Self-pay | Admitting: Physical Medicine & Rehabilitation

## 2016-11-23 ENCOUNTER — Other Ambulatory Visit: Payer: Self-pay | Admitting: Physical Medicine & Rehabilitation

## 2016-11-25 ENCOUNTER — Other Ambulatory Visit: Payer: Self-pay | Admitting: *Deleted

## 2016-11-25 MED ORDER — TRAMADOL HCL 50 MG PO TABS: 50.0000 mg | ORAL_TABLET | Freq: Three times a day (TID) | ORAL | 0 refills | Status: DC | PRN

## 2016-11-27 DIAGNOSIS — Z111 Encounter for screening for respiratory tuberculosis: Secondary | ICD-10-CM | POA: Diagnosis not present

## 2016-11-27 DIAGNOSIS — Z23 Encounter for immunization: Secondary | ICD-10-CM | POA: Diagnosis not present

## 2016-11-28 DIAGNOSIS — S062X9S Diffuse traumatic brain injury with loss of consciousness of unspecified duration, sequela: Secondary | ICD-10-CM | POA: Diagnosis not present

## 2016-11-28 DIAGNOSIS — G8250 Quadriplegia, unspecified: Secondary | ICD-10-CM | POA: Diagnosis not present

## 2016-11-29 NOTE — Telephone Encounter (Signed)
Closing encounter

## 2016-12-11 ENCOUNTER — Telehealth: Payer: Self-pay | Admitting: Physical Medicine & Rehabilitation

## 2016-12-11 NOTE — Telephone Encounter (Signed)
Patient's father has called in to confirm her appt and has also requested to speak to Dr. [name] personally wouldnt specify the reasoning for the call but stated that it was about the patient.  Please call patient's father at [phone]

## 2016-12-14 DIAGNOSIS — G8250 Quadriplegia, unspecified: Secondary | ICD-10-CM | POA: Diagnosis not present

## 2016-12-14 DIAGNOSIS — S062X9S Diffuse traumatic brain injury with loss of consciousness of unspecified duration, sequela: Secondary | ICD-10-CM | POA: Diagnosis not present

## 2016-12-18 ENCOUNTER — Ambulatory Visit: Payer: Medicare Other | Admitting: Physical Medicine & Rehabilitation

## 2016-12-20 ENCOUNTER — Telehealth: Payer: Self-pay

## 2016-12-20 NOTE — Telephone Encounter (Signed)
Called today requesting verbal orders for 2 more additional visits with patient, orders given at time of phone call

## 2016-12-21 DIAGNOSIS — S062X9S Diffuse traumatic brain injury with loss of consciousness of unspecified duration, sequela: Secondary | ICD-10-CM | POA: Diagnosis not present

## 2016-12-21 DIAGNOSIS — G8250 Quadriplegia, unspecified: Secondary | ICD-10-CM | POA: Diagnosis not present

## 2016-12-28 ENCOUNTER — Other Ambulatory Visit: Payer: Self-pay | Admitting: Physical Medicine & Rehabilitation

## 2016-12-28 DIAGNOSIS — G8250 Quadriplegia, unspecified: Secondary | ICD-10-CM | POA: Diagnosis not present

## 2016-12-28 DIAGNOSIS — S062X9S Diffuse traumatic brain injury with loss of consciousness of unspecified duration, sequela: Secondary | ICD-10-CM | POA: Diagnosis not present

## 2016-12-30 ENCOUNTER — Encounter: Payer: Self-pay | Admitting: Physical Medicine & Rehabilitation

## 2016-12-30 ENCOUNTER — Encounter: Payer: Medicare Other | Attending: Physical Medicine & Rehabilitation | Admitting: Physical Medicine & Rehabilitation

## 2016-12-30 VITALS — BP 102/69 | HR 71

## 2016-12-30 DIAGNOSIS — Z743 Need for continuous supervision: Secondary | ICD-10-CM | POA: Diagnosis not present

## 2016-12-30 DIAGNOSIS — Z993 Dependence on wheelchair: Secondary | ICD-10-CM | POA: Diagnosis not present

## 2016-12-30 DIAGNOSIS — G8250 Quadriplegia, unspecified: Secondary | ICD-10-CM | POA: Insufficient documentation

## 2016-12-30 DIAGNOSIS — M25512 Pain in left shoulder: Secondary | ICD-10-CM | POA: Insufficient documentation

## 2016-12-30 DIAGNOSIS — R279 Unspecified lack of coordination: Secondary | ICD-10-CM | POA: Diagnosis not present

## 2016-12-30 DIAGNOSIS — Z87891 Personal history of nicotine dependence: Secondary | ICD-10-CM | POA: Insufficient documentation

## 2016-12-30 DIAGNOSIS — K5900 Constipation, unspecified: Secondary | ICD-10-CM | POA: Diagnosis not present

## 2016-12-30 DIAGNOSIS — Z7401 Bed confinement status: Secondary | ICD-10-CM | POA: Diagnosis not present

## 2016-12-30 DIAGNOSIS — M898X9 Other specified disorders of bone, unspecified site: Secondary | ICD-10-CM

## 2016-12-30 NOTE — Progress Notes (Signed)
Botox Injection for spasticity using needle EMG guidance Indication: Spastic tetraplegia ([place]) - Plan: Ambulatory referral to [facility], Ambulatory referral to Orthopedic Surgery, DG Shoulder Left, DG Elbow 2 Views Left  Heterotopic ossification of bone - Plan: DG Shoulder Left, DG Elbow 2 Views Left   Dilution: 100 Units/ml        Total Units Injected: 500 Indication: Severe spasticity which interferes with ADL,mobility and/or  hygiene and is unresponsive to medication management and other conservative care Informed consent was obtained after describing risks and benefits of the procedure with the patient. This includes bleeding, bruising, infection, excessive weakness, or medication side effects. A REMS form is on file and signed.  Needle: 25g needle  Number of units per muscle Pectoralis Major 0 units Pectoralis Minor 0 units Biceps 200 units Brachioradialis 50 units FCR 25 units FCU 25 units FDS 100 units FDP 100 units FPL 0 units Pronator Teres 0 units Pronator Quadratus 0 units Quadriceps 0 units Gastroc/soleus 0 units Hamstrings 0 units Tibialis Posterior 0 units EHL 0 units All injections were done after obtaining appropriate  after negative drawback for blood. The patient tolerated the procedure well. Post procedure instructions were given. Return in about 2 months (around 02/27/2017).   Referrals were made to orthopedic surgery to assess for potential tendon lengthening, manipulation surgery to maximize use oef left hand, decrease pain. Also made [facility] referrals.    Additionally xrays of the left elbow and left shoulder were ordered to assess for ongoing pain in these areas.

## 2016-12-30 NOTE — Patient Instructions (Signed)
PLEASE FEEL FREE TO CALL OUR OFFICE WITH ANY PROBLEMS OR QUESTIONS ([phone])      

## 2017-01-03 ENCOUNTER — Telehealth: Payer: Self-pay

## 2017-01-03 ENCOUNTER — Encounter: Payer: Self-pay | Admitting: Physical Medicine & Rehabilitation

## 2017-01-03 ENCOUNTER — Other Ambulatory Visit: Payer: Self-pay | Admitting: Physical Medicine & Rehabilitation

## 2017-01-03 DIAGNOSIS — F411 Generalized anxiety disorder: Secondary | ICD-10-CM

## 2017-01-03 NOTE — Telephone Encounter (Signed)
[name] would like a verbal order to extend OT for 5 additional session to prepare patient, caregivers and family members before discharge. [name] Can be reached at [phone] if there are any questions or concerns. Verbal order was given

## 2017-01-04 DIAGNOSIS — G8250 Quadriplegia, unspecified: Secondary | ICD-10-CM | POA: Diagnosis not present

## 2017-01-04 DIAGNOSIS — S062X9S Diffuse traumatic brain injury with loss of consciousness of unspecified duration, sequela: Secondary | ICD-10-CM | POA: Diagnosis not present

## 2017-01-06 ENCOUNTER — Other Ambulatory Visit: Payer: Self-pay | Admitting: Physical Medicine & Rehabilitation

## 2017-01-06 ENCOUNTER — Telehealth: Payer: Self-pay | Admitting: *Deleted

## 2017-01-06 NOTE — Telephone Encounter (Signed)
Would like to stay at #90.  thanks

## 2017-01-06 NOTE — Telephone Encounter (Signed)
Toma Aran, Tyrea's mother, left a message stating that she believes her daughters Rx for lorazepam has changed over time.  She thinks her monthly pill count should be #120 instead of #90.  I reviewed [place] and it shows that we have consistently prescribed #90 and prior it was #60.  I'm not sure where this is coming from, perhaps it is because the sig states take 1 tablet every 6 hours as needed for anxiety.  Please advise

## 2017-01-06 NOTE — Telephone Encounter (Signed)
[name]. [place] would like a refill of the following medications:     diazepam (VALIUM) 5 MG tablet [SWARTZ,ZACHARY T, MD]   Patient Comment: This quantity is suppose to be 120 tablets, but is listed as 90. This error began in Oct 2017 when there was a payment question with the insurance. I have assured the pharmacy that the difference would be paid and to stop changing [name]'s prescritions. Please, correct the quantity and notify the pharmacy. Thank you

## 2017-01-06 NOTE — Telephone Encounter (Signed)
These meds have not been mentioned in notes in past year, do you want to continue to refill?

## 2017-01-07 MED ORDER — LORAZEPAM 0.5 MG PO TABS: ORAL_TABLET | ORAL | 3 refills | Status: DC

## 2017-01-07 MED ORDER — TRAZODONE HCL 50 MG PO TABS: ORAL_TABLET | ORAL | 3 refills | Status: DC

## 2017-01-07 NOTE — Telephone Encounter (Signed)
Mrs [name] notified.

## 2017-01-09 ENCOUNTER — Telehealth: Payer: Self-pay | Admitting: Physical Medicine & Rehabilitation

## 2017-01-09 DIAGNOSIS — G8250 Quadriplegia, unspecified: Secondary | ICD-10-CM | POA: Diagnosis not present

## 2017-01-09 DIAGNOSIS — S062X9S Diffuse traumatic brain injury with loss of consciousness of unspecified duration, sequela: Secondary | ICD-10-CM | POA: Diagnosis not present

## 2017-01-09 DIAGNOSIS — R41841 Cognitive communication deficit: Secondary | ICD-10-CM | POA: Diagnosis not present

## 2017-01-09 NOTE — Telephone Encounter (Signed)
They do not appear to have been done ( no reports available). LVM.

## 2017-01-09 NOTE — Telephone Encounter (Signed)
patients father would like to know if the patient has gotten her x-rays done yet  (( he is on DPR from 2016 )

## 2017-01-13 ENCOUNTER — Telehealth: Payer: Self-pay | Admitting: *Deleted

## 2017-01-13 NOTE — Telephone Encounter (Signed)
Then why did we submit new orders???? i'm confused

## 2017-01-13 NOTE — Telephone Encounter (Signed)
Barrister's clerk with [facility] called in reference to Dr [name] order for [facility]--- [facility] has been in place for [place] since 07/2016 and is set to be discharged next week on Wednesday (01/22/17). If further services are needed they will not be continuing with her and another agency will need to be contacted.

## 2017-01-14 NOTE — Telephone Encounter (Signed)
.    Becky from [place] says that she has maxed out on her services and they have actually extended it longer than they would have just to appease the mother but she has reached maximum potential and they can no longer provide services for her.

## 2017-01-27 ENCOUNTER — Other Ambulatory Visit: Payer: Self-pay | Admitting: Physical Medicine & Rehabilitation

## 2017-01-27 NOTE — Telephone Encounter (Signed)
No mention of continuing this medication in last notes and patient is on hydrocodone as well, please advise

## 2017-01-28 ENCOUNTER — Other Ambulatory Visit: Payer: Self-pay | Admitting: *Deleted

## 2017-01-28 ENCOUNTER — Encounter: Payer: Self-pay | Admitting: Physical Medicine & Rehabilitation

## 2017-01-28 ENCOUNTER — Other Ambulatory Visit: Payer: Self-pay | Admitting: Physical Medicine & Rehabilitation

## 2017-01-28 NOTE — Telephone Encounter (Signed)
error 

## 2017-01-29 ENCOUNTER — Other Ambulatory Visit: Payer: Self-pay

## 2017-01-29 MED ORDER — TRAMADOL HCL 50 MG PO TABS: 50.0000 mg | ORAL_TABLET | Freq: Four times a day (QID) | ORAL | 0 refills | Status: DC | PRN

## 2017-01-29 NOTE — Telephone Encounter (Signed)
Patient requesting a refill of this medication, no mention of it in last notes, please advise

## 2017-01-29 NOTE — Telephone Encounter (Signed)
Patient requested a refill of this medication, no mention of using this medication or continuing this medication in previous notes, please advise

## 2017-02-03 NOTE — Telephone Encounter (Signed)
LVM asking for Toma Aran to call me back, [name] has indicated maximum potential has been reached, Hand referral was denied by Dr. Etheleen Mayhew and referred back to Dr. [name] for additional options

## 2017-02-07 DIAGNOSIS — G40909 Epilepsy, unspecified, not intractable, without status epilepticus: Secondary | ICD-10-CM | POA: Diagnosis not present

## 2017-02-07 DIAGNOSIS — K219 Gastro-esophageal reflux disease without esophagitis: Secondary | ICD-10-CM | POA: Diagnosis not present

## 2017-02-07 DIAGNOSIS — M25512 Pain in left shoulder: Secondary | ICD-10-CM | POA: Diagnosis not present

## 2017-02-07 DIAGNOSIS — G8250 Quadriplegia, unspecified: Secondary | ICD-10-CM | POA: Diagnosis not present

## 2017-02-07 DIAGNOSIS — I10 Essential (primary) hypertension: Secondary | ICD-10-CM | POA: Diagnosis not present

## 2017-02-07 DIAGNOSIS — F329 Major depressive disorder, single episode, unspecified: Secondary | ICD-10-CM | POA: Diagnosis not present

## 2017-02-11 ENCOUNTER — Telehealth: Payer: Self-pay | Admitting: *Deleted

## 2017-02-11 DIAGNOSIS — G8250 Quadriplegia, unspecified: Secondary | ICD-10-CM | POA: Diagnosis not present

## 2017-02-11 DIAGNOSIS — K219 Gastro-esophageal reflux disease without esophagitis: Secondary | ICD-10-CM | POA: Diagnosis not present

## 2017-02-11 DIAGNOSIS — F329 Major depressive disorder, single episode, unspecified: Secondary | ICD-10-CM | POA: Diagnosis not present

## 2017-02-11 DIAGNOSIS — G40909 Epilepsy, unspecified, not intractable, without status epilepticus: Secondary | ICD-10-CM | POA: Diagnosis not present

## 2017-02-11 DIAGNOSIS — M25512 Pain in left shoulder: Secondary | ICD-10-CM | POA: Diagnosis not present

## 2017-02-11 DIAGNOSIS — I10 Essential (primary) hypertension: Secondary | ICD-10-CM | POA: Diagnosis not present

## 2017-02-11 NOTE — Telephone Encounter (Signed)
Physical therapist left a message asking for verbal orders 1week4 to address therapeutic exercise, bend mobility, and home exercise program

## 2017-02-11 NOTE — Telephone Encounter (Signed)
Left message for [place] to call back.  Her VM has no identifier on it and I cannot leave patient information.

## 2017-02-13 DIAGNOSIS — F329 Major depressive disorder, single episode, unspecified: Secondary | ICD-10-CM | POA: Diagnosis not present

## 2017-02-13 DIAGNOSIS — G40909 Epilepsy, unspecified, not intractable, without status epilepticus: Secondary | ICD-10-CM | POA: Diagnosis not present

## 2017-02-13 DIAGNOSIS — K219 Gastro-esophageal reflux disease without esophagitis: Secondary | ICD-10-CM | POA: Diagnosis not present

## 2017-02-13 DIAGNOSIS — G8250 Quadriplegia, unspecified: Secondary | ICD-10-CM | POA: Diagnosis not present

## 2017-02-13 DIAGNOSIS — I10 Essential (primary) hypertension: Secondary | ICD-10-CM | POA: Diagnosis not present

## 2017-02-13 DIAGNOSIS — M25512 Pain in left shoulder: Secondary | ICD-10-CM | POA: Diagnosis not present

## 2017-02-14 ENCOUNTER — Encounter: Payer: Self-pay | Admitting: Physical Medicine & Rehabilitation

## 2017-02-18 DIAGNOSIS — M25512 Pain in left shoulder: Secondary | ICD-10-CM | POA: Diagnosis not present

## 2017-02-18 DIAGNOSIS — K219 Gastro-esophageal reflux disease without esophagitis: Secondary | ICD-10-CM | POA: Diagnosis not present

## 2017-02-18 DIAGNOSIS — F329 Major depressive disorder, single episode, unspecified: Secondary | ICD-10-CM | POA: Diagnosis not present

## 2017-02-18 DIAGNOSIS — G40909 Epilepsy, unspecified, not intractable, without status epilepticus: Secondary | ICD-10-CM | POA: Diagnosis not present

## 2017-02-18 DIAGNOSIS — I10 Essential (primary) hypertension: Secondary | ICD-10-CM | POA: Diagnosis not present

## 2017-02-18 DIAGNOSIS — G8250 Quadriplegia, unspecified: Secondary | ICD-10-CM | POA: Diagnosis not present

## 2017-02-24 ENCOUNTER — Other Ambulatory Visit: Payer: Self-pay | Admitting: Physical Medicine & Rehabilitation

## 2017-02-26 ENCOUNTER — Encounter: Payer: Medicare Other | Admitting: Physical Medicine & Rehabilitation

## 2017-02-27 ENCOUNTER — Ambulatory Visit (HOSPITAL_COMMUNITY)
Admission: RE | Admit: 2017-02-27 | Discharge: 2017-02-27 | Disposition: A | Discharge status: Home / Self Care | Payer: Medicare Other | Source: Ambulatory Visit | Attending: Physical Medicine & Rehabilitation | Admitting: Physical Medicine & Rehabilitation

## 2017-02-27 DIAGNOSIS — M19022 Primary osteoarthritis, left elbow: Secondary | ICD-10-CM | POA: Insufficient documentation

## 2017-02-27 DIAGNOSIS — M810 Age-related osteoporosis without current pathological fracture: Secondary | ICD-10-CM | POA: Insufficient documentation

## 2017-02-27 DIAGNOSIS — K219 Gastro-esophageal reflux disease without esophagitis: Secondary | ICD-10-CM | POA: Diagnosis not present

## 2017-02-27 DIAGNOSIS — G8250 Quadriplegia, unspecified: Secondary | ICD-10-CM | POA: Diagnosis not present

## 2017-02-27 DIAGNOSIS — M25522 Pain in left elbow: Secondary | ICD-10-CM | POA: Diagnosis not present

## 2017-02-27 DIAGNOSIS — S43035A Inferior dislocation of left humerus, initial encounter: Secondary | ICD-10-CM | POA: Insufficient documentation

## 2017-02-27 DIAGNOSIS — I10 Essential (primary) hypertension: Secondary | ICD-10-CM | POA: Diagnosis not present

## 2017-02-27 DIAGNOSIS — M898X9 Other specified disorders of bone, unspecified site: Secondary | ICD-10-CM

## 2017-02-27 DIAGNOSIS — X58XXXA Exposure to other specified factors, initial encounter: Secondary | ICD-10-CM | POA: Diagnosis not present

## 2017-02-27 DIAGNOSIS — M25512 Pain in left shoulder: Secondary | ICD-10-CM | POA: Diagnosis not present

## 2017-02-27 DIAGNOSIS — G40909 Epilepsy, unspecified, not intractable, without status epilepticus: Secondary | ICD-10-CM | POA: Diagnosis not present

## 2017-02-27 DIAGNOSIS — F329 Major depressive disorder, single episode, unspecified: Secondary | ICD-10-CM | POA: Diagnosis not present

## 2017-02-27 DIAGNOSIS — S43015A Anterior dislocation of left humerus, initial encounter: Secondary | ICD-10-CM | POA: Diagnosis not present

## 2017-02-28 DIAGNOSIS — F329 Major depressive disorder, single episode, unspecified: Secondary | ICD-10-CM | POA: Diagnosis not present

## 2017-02-28 DIAGNOSIS — I10 Essential (primary) hypertension: Secondary | ICD-10-CM | POA: Diagnosis not present

## 2017-02-28 DIAGNOSIS — G40909 Epilepsy, unspecified, not intractable, without status epilepticus: Secondary | ICD-10-CM | POA: Diagnosis not present

## 2017-02-28 DIAGNOSIS — M25512 Pain in left shoulder: Secondary | ICD-10-CM | POA: Diagnosis not present

## 2017-02-28 DIAGNOSIS — G8250 Quadriplegia, unspecified: Secondary | ICD-10-CM | POA: Diagnosis not present

## 2017-02-28 DIAGNOSIS — K219 Gastro-esophageal reflux disease without esophagitis: Secondary | ICD-10-CM | POA: Diagnosis not present

## 2017-03-03 ENCOUNTER — Encounter: Payer: Self-pay | Admitting: Physical Medicine & Rehabilitation

## 2017-03-03 ENCOUNTER — Other Ambulatory Visit: Payer: Self-pay | Admitting: Physical Medicine & Rehabilitation

## 2017-03-03 DIAGNOSIS — G8250 Quadriplegia, unspecified: Secondary | ICD-10-CM | POA: Diagnosis not present

## 2017-03-03 DIAGNOSIS — M24542 Contracture, left hand: Secondary | ICD-10-CM | POA: Diagnosis not present

## 2017-03-03 DIAGNOSIS — M24532 Contracture, left wrist: Secondary | ICD-10-CM | POA: Diagnosis not present

## 2017-03-03 DIAGNOSIS — S069X9A Unspecified intracranial injury with loss of consciousness of unspecified duration, initial encounter: Secondary | ICD-10-CM | POA: Diagnosis not present

## 2017-03-06 DIAGNOSIS — G8250 Quadriplegia, unspecified: Secondary | ICD-10-CM | POA: Diagnosis not present

## 2017-03-06 DIAGNOSIS — I10 Essential (primary) hypertension: Secondary | ICD-10-CM | POA: Diagnosis not present

## 2017-03-06 DIAGNOSIS — F329 Major depressive disorder, single episode, unspecified: Secondary | ICD-10-CM | POA: Diagnosis not present

## 2017-03-06 DIAGNOSIS — K219 Gastro-esophageal reflux disease without esophagitis: Secondary | ICD-10-CM | POA: Diagnosis not present

## 2017-03-06 DIAGNOSIS — G40909 Epilepsy, unspecified, not intractable, without status epilepticus: Secondary | ICD-10-CM | POA: Diagnosis not present

## 2017-03-06 DIAGNOSIS — M25512 Pain in left shoulder: Secondary | ICD-10-CM | POA: Diagnosis not present

## 2017-03-07 DIAGNOSIS — G8250 Quadriplegia, unspecified: Secondary | ICD-10-CM | POA: Diagnosis not present

## 2017-03-07 DIAGNOSIS — G40909 Epilepsy, unspecified, not intractable, without status epilepticus: Secondary | ICD-10-CM | POA: Diagnosis not present

## 2017-03-07 DIAGNOSIS — K219 Gastro-esophageal reflux disease without esophagitis: Secondary | ICD-10-CM | POA: Diagnosis not present

## 2017-03-07 DIAGNOSIS — M25512 Pain in left shoulder: Secondary | ICD-10-CM | POA: Diagnosis not present

## 2017-03-07 DIAGNOSIS — I10 Essential (primary) hypertension: Secondary | ICD-10-CM | POA: Diagnosis not present

## 2017-03-07 DIAGNOSIS — F329 Major depressive disorder, single episode, unspecified: Secondary | ICD-10-CM | POA: Diagnosis not present

## 2017-03-10 ENCOUNTER — Other Ambulatory Visit: Payer: Self-pay | Admitting: Physical Medicine & Rehabilitation

## 2017-03-11 ENCOUNTER — Telehealth: Payer: Self-pay | Admitting: *Deleted

## 2017-03-11 MED ORDER — BOOST GLUCOSE CONTROL PO LIQD: 1.0000 | Freq: Two times a day (BID) | ORAL | 0 refills | Status: DC

## 2017-03-11 MED ORDER — BOOST GLUCOSE CONTROL PO LIQD: 1.0000 | Freq: Two times a day (BID) | ORAL | 1 refills | Status: DC

## 2017-03-11 NOTE — Telephone Encounter (Signed)
Order placed for the one month supply of boost glucose control liquid to [facility] per email exchange by Dr [name] and Dellie Burns mother.

## 2017-03-13 DIAGNOSIS — F329 Major depressive disorder, single episode, unspecified: Secondary | ICD-10-CM | POA: Diagnosis not present

## 2017-03-13 DIAGNOSIS — M25512 Pain in left shoulder: Secondary | ICD-10-CM | POA: Diagnosis not present

## 2017-03-13 DIAGNOSIS — K219 Gastro-esophageal reflux disease without esophagitis: Secondary | ICD-10-CM | POA: Diagnosis not present

## 2017-03-13 DIAGNOSIS — G40909 Epilepsy, unspecified, not intractable, without status epilepticus: Secondary | ICD-10-CM | POA: Diagnosis not present

## 2017-03-13 DIAGNOSIS — I10 Essential (primary) hypertension: Secondary | ICD-10-CM | POA: Diagnosis not present

## 2017-03-13 DIAGNOSIS — G8250 Quadriplegia, unspecified: Secondary | ICD-10-CM | POA: Diagnosis not present

## 2017-03-14 DIAGNOSIS — F329 Major depressive disorder, single episode, unspecified: Secondary | ICD-10-CM | POA: Diagnosis not present

## 2017-03-14 DIAGNOSIS — I10 Essential (primary) hypertension: Secondary | ICD-10-CM | POA: Diagnosis not present

## 2017-03-14 DIAGNOSIS — G8250 Quadriplegia, unspecified: Secondary | ICD-10-CM | POA: Diagnosis not present

## 2017-03-14 DIAGNOSIS — M25512 Pain in left shoulder: Secondary | ICD-10-CM | POA: Diagnosis not present

## 2017-03-14 DIAGNOSIS — K219 Gastro-esophageal reflux disease without esophagitis: Secondary | ICD-10-CM | POA: Diagnosis not present

## 2017-03-14 DIAGNOSIS — G40909 Epilepsy, unspecified, not intractable, without status epilepticus: Secondary | ICD-10-CM | POA: Diagnosis not present

## 2017-03-18 DIAGNOSIS — F329 Major depressive disorder, single episode, unspecified: Secondary | ICD-10-CM | POA: Diagnosis not present

## 2017-03-18 DIAGNOSIS — G40909 Epilepsy, unspecified, not intractable, without status epilepticus: Secondary | ICD-10-CM | POA: Diagnosis not present

## 2017-03-18 DIAGNOSIS — M25512 Pain in left shoulder: Secondary | ICD-10-CM | POA: Diagnosis not present

## 2017-03-18 DIAGNOSIS — G8250 Quadriplegia, unspecified: Secondary | ICD-10-CM | POA: Diagnosis not present

## 2017-03-18 DIAGNOSIS — I10 Essential (primary) hypertension: Secondary | ICD-10-CM | POA: Diagnosis not present

## 2017-03-18 DIAGNOSIS — K219 Gastro-esophageal reflux disease without esophagitis: Secondary | ICD-10-CM | POA: Diagnosis not present

## 2017-03-20 DIAGNOSIS — F329 Major depressive disorder, single episode, unspecified: Secondary | ICD-10-CM | POA: Diagnosis not present

## 2017-03-20 DIAGNOSIS — M25512 Pain in left shoulder: Secondary | ICD-10-CM | POA: Diagnosis not present

## 2017-03-20 DIAGNOSIS — I10 Essential (primary) hypertension: Secondary | ICD-10-CM | POA: Diagnosis not present

## 2017-03-20 DIAGNOSIS — K219 Gastro-esophageal reflux disease without esophagitis: Secondary | ICD-10-CM | POA: Diagnosis not present

## 2017-03-20 DIAGNOSIS — G8250 Quadriplegia, unspecified: Secondary | ICD-10-CM | POA: Diagnosis not present

## 2017-03-20 DIAGNOSIS — G40909 Epilepsy, unspecified, not intractable, without status epilepticus: Secondary | ICD-10-CM | POA: Diagnosis not present

## 2017-03-21 ENCOUNTER — Telehealth: Payer: Self-pay

## 2017-03-21 NOTE — Telephone Encounter (Signed)
Pete PT called, stated had to move her consult to next week due to not being able to coordinate her care with her daughter

## 2017-03-25 ENCOUNTER — Encounter: Payer: Medicare Other | Admitting: Physical Medicine & Rehabilitation

## 2017-03-27 DIAGNOSIS — G8250 Quadriplegia, unspecified: Secondary | ICD-10-CM | POA: Diagnosis not present

## 2017-03-27 DIAGNOSIS — K219 Gastro-esophageal reflux disease without esophagitis: Secondary | ICD-10-CM | POA: Diagnosis not present

## 2017-03-27 DIAGNOSIS — F329 Major depressive disorder, single episode, unspecified: Secondary | ICD-10-CM | POA: Diagnosis not present

## 2017-03-27 DIAGNOSIS — M25512 Pain in left shoulder: Secondary | ICD-10-CM | POA: Diagnosis not present

## 2017-03-27 DIAGNOSIS — I10 Essential (primary) hypertension: Secondary | ICD-10-CM | POA: Diagnosis not present

## 2017-03-27 DIAGNOSIS — G40909 Epilepsy, unspecified, not intractable, without status epilepticus: Secondary | ICD-10-CM | POA: Diagnosis not present

## 2017-03-28 ENCOUNTER — Encounter: Payer: Self-pay | Admitting: Physical Medicine & Rehabilitation

## 2017-03-28 DIAGNOSIS — G40909 Epilepsy, unspecified, not intractable, without status epilepticus: Secondary | ICD-10-CM | POA: Diagnosis not present

## 2017-03-28 DIAGNOSIS — K219 Gastro-esophageal reflux disease without esophagitis: Secondary | ICD-10-CM | POA: Diagnosis not present

## 2017-03-28 DIAGNOSIS — M25512 Pain in left shoulder: Secondary | ICD-10-CM | POA: Diagnosis not present

## 2017-03-28 DIAGNOSIS — F329 Major depressive disorder, single episode, unspecified: Secondary | ICD-10-CM | POA: Diagnosis not present

## 2017-03-28 DIAGNOSIS — I10 Essential (primary) hypertension: Secondary | ICD-10-CM | POA: Diagnosis not present

## 2017-03-28 DIAGNOSIS — G8250 Quadriplegia, unspecified: Secondary | ICD-10-CM | POA: Diagnosis not present

## 2017-03-31 ENCOUNTER — Other Ambulatory Visit: Payer: Self-pay | Admitting: Physical Medicine & Rehabilitation

## 2017-03-31 NOTE — Telephone Encounter (Signed)
Recieved electronic medication refill request for carbamazepine, meloxicam, and quetapine 50mg  and quetiapine 100mg , can refill the first 2 easily, but the repeat med I need to know the last order of dosing instructions so I can both refill med and remove the duplicate from her chart please advise

## 2017-04-01 DIAGNOSIS — I10 Essential (primary) hypertension: Secondary | ICD-10-CM | POA: Diagnosis not present

## 2017-04-01 DIAGNOSIS — G40909 Epilepsy, unspecified, not intractable, without status epilepticus: Secondary | ICD-10-CM | POA: Diagnosis not present

## 2017-04-01 DIAGNOSIS — K219 Gastro-esophageal reflux disease without esophagitis: Secondary | ICD-10-CM | POA: Diagnosis not present

## 2017-04-01 DIAGNOSIS — M25512 Pain in left shoulder: Secondary | ICD-10-CM | POA: Diagnosis not present

## 2017-04-01 DIAGNOSIS — F329 Major depressive disorder, single episode, unspecified: Secondary | ICD-10-CM | POA: Diagnosis not present

## 2017-04-01 DIAGNOSIS — G8250 Quadriplegia, unspecified: Secondary | ICD-10-CM | POA: Diagnosis not present

## 2017-04-02 ENCOUNTER — Other Ambulatory Visit (HOSPITAL_COMMUNITY): Payer: Self-pay | Admitting: Orthopedic Surgery

## 2017-04-02 ENCOUNTER — Ambulatory Visit (HOSPITAL_COMMUNITY)
Admission: RE | Admit: 2017-04-02 | Discharge: 2017-04-02 | Disposition: A | Discharge status: Home / Self Care | Payer: Medicare Other | Source: Ambulatory Visit | Attending: Orthopedic Surgery | Admitting: Orthopedic Surgery

## 2017-04-02 DIAGNOSIS — M79642 Pain in left hand: Secondary | ICD-10-CM | POA: Diagnosis not present

## 2017-04-02 DIAGNOSIS — G8250 Quadriplegia, unspecified: Secondary | ICD-10-CM

## 2017-04-02 DIAGNOSIS — M85842 Other specified disorders of bone density and structure, left hand: Secondary | ICD-10-CM | POA: Insufficient documentation

## 2017-04-03 DIAGNOSIS — G8250 Quadriplegia, unspecified: Secondary | ICD-10-CM | POA: Diagnosis not present

## 2017-04-03 DIAGNOSIS — G40909 Epilepsy, unspecified, not intractable, without status epilepticus: Secondary | ICD-10-CM | POA: Diagnosis not present

## 2017-04-03 DIAGNOSIS — I10 Essential (primary) hypertension: Secondary | ICD-10-CM | POA: Diagnosis not present

## 2017-04-03 DIAGNOSIS — K219 Gastro-esophageal reflux disease without esophagitis: Secondary | ICD-10-CM | POA: Diagnosis not present

## 2017-04-03 DIAGNOSIS — M25512 Pain in left shoulder: Secondary | ICD-10-CM | POA: Diagnosis not present

## 2017-04-03 DIAGNOSIS — F329 Major depressive disorder, single episode, unspecified: Secondary | ICD-10-CM | POA: Diagnosis not present

## 2017-04-04 ENCOUNTER — Encounter: Payer: Self-pay | Admitting: Physical Medicine & Rehabilitation

## 2017-04-08 ENCOUNTER — Encounter: Payer: Self-pay | Admitting: Physical Medicine & Rehabilitation

## 2017-04-08 ENCOUNTER — Telehealth: Payer: Self-pay | Admitting: *Deleted

## 2017-04-08 ENCOUNTER — Other Ambulatory Visit: Payer: Self-pay | Admitting: Physical Medicine & Rehabilitation

## 2017-04-08 DIAGNOSIS — Z87820 Personal history of traumatic brain injury: Secondary | ICD-10-CM | POA: Diagnosis not present

## 2017-04-08 DIAGNOSIS — G8250 Quadriplegia, unspecified: Secondary | ICD-10-CM | POA: Diagnosis not present

## 2017-04-08 MED ORDER — CARBAMAZEPINE 200 MG PO TABS: 200.0000 mg | ORAL_TABLET | Freq: Four times a day (QID) | ORAL | 2 refills | Status: DC

## 2017-04-08 MED ORDER — QUETIAPINE FUMARATE 100 MG PO TABS: ORAL_TABLET | ORAL | 2 refills | Status: DC

## 2017-04-08 MED ORDER — MELOXICAM 7.5 MG PO TABS: 7.5000 mg | ORAL_TABLET | Freq: Every day | ORAL | 2 refills | Status: DC

## 2017-04-08 MED ORDER — QUETIAPINE FUMARATE 50 MG PO TABS: 50.0000 mg | ORAL_TABLET | Freq: Two times a day (BID) | ORAL | 2 refills | Status: DC

## 2017-04-08 NOTE — Telephone Encounter (Signed)
Kaci needs her diaper order changed to [employer] through [employer], please. Order sent. (on hand written rx to be scanned into media)

## 2017-04-09 ENCOUNTER — Other Ambulatory Visit: Payer: Self-pay | Admitting: *Deleted

## 2017-04-14 DIAGNOSIS — S069X9A Unspecified intracranial injury with loss of consciousness of unspecified duration, initial encounter: Secondary | ICD-10-CM | POA: Diagnosis not present

## 2017-04-14 DIAGNOSIS — G8250 Quadriplegia, unspecified: Secondary | ICD-10-CM | POA: Diagnosis not present

## 2017-04-15 ENCOUNTER — Telehealth: Payer: Self-pay | Admitting: Physical Medicine & Rehabilitation

## 2017-04-15 NOTE — Telephone Encounter (Signed)
Patients father Guardian Juanna Cao signed release for Med Rec's for 01.15-05.15 - patient was not seen in clinic during this time - sent to scan center

## 2017-04-16 ENCOUNTER — Encounter: Payer: Self-pay | Admitting: Physical Medicine & Rehabilitation

## 2017-04-21 ENCOUNTER — Telehealth: Payer: Self-pay | Admitting: Physical Medicine & Rehabilitation

## 2017-04-21 ENCOUNTER — Encounter: Payer: Medicare Other | Attending: Physical Medicine & Rehabilitation | Admitting: Physical Medicine & Rehabilitation

## 2017-04-21 ENCOUNTER — Encounter: Payer: Self-pay | Admitting: Physical Medicine & Rehabilitation

## 2017-04-21 VITALS — BP 116/77 | HR 61

## 2017-04-21 DIAGNOSIS — Z87891 Personal history of nicotine dependence: Secondary | ICD-10-CM | POA: Diagnosis not present

## 2017-04-21 DIAGNOSIS — Z90710 Acquired absence of both cervix and uterus: Secondary | ICD-10-CM | POA: Diagnosis not present

## 2017-04-21 DIAGNOSIS — B373 Candidiasis of vulva and vagina: Secondary | ICD-10-CM | POA: Diagnosis not present

## 2017-04-21 DIAGNOSIS — S42422S Displaced comminuted supracondylar fracture without intercondylar fracture of left humerus, sequela: Secondary | ICD-10-CM

## 2017-04-21 DIAGNOSIS — G8250 Quadriplegia, unspecified: Secondary | ICD-10-CM | POA: Insufficient documentation

## 2017-04-21 DIAGNOSIS — K5900 Constipation, unspecified: Secondary | ICD-10-CM | POA: Insufficient documentation

## 2017-04-21 NOTE — Patient Instructions (Signed)
PLEASE FEEL FREE TO CALL OUR OFFICE WITH ANY PROBLEMS OR QUESTIONS ([phone])      

## 2017-04-21 NOTE — Telephone Encounter (Signed)
Father Mr. Jimmye Norman called to confirm faxed to [facility] med rec[facility] for visit here 07.15-05.18-- faxed today.  Document for hospital IP stay was also sent to hospital med rec's department- [facility] on file for both

## 2017-04-21 NOTE — Progress Notes (Signed)
Subjective:    Patient ID: [name], female    DOB: 1981/04/28, 36 y.o.   MRN: [id number]  HPI  [name] is here in follow up of her TBI and spastic tetraplegia. She has seen Dr. Fredna Dow who will be performing tendon lengthening surgery next month to her left finger flexors. Mom continues to provide assistance for [facility] although she has become more independent. No medication changes are reported today. She has done fairly well with botox injections, the last of which were to her left upper extremity, in particular her left elbow and finger/wrist flexors  Pain Inventory Average Pain 3 Pain Right Now 2 My pain is dull, tingling and aching  In the last 24 hours, has pain interfered with the following? General activity 3 Relation with others 1 Enjoyment of life 2 What TIME of day is your pain at its worst? evening Sleep (in general) Fair  Pain is worse with: some activites Pain improves with: rest, therapy/exercise and medication Relief from Meds: 7  Mobility walk without assistance ability to climb steps?  no do you drive?  no  Function disabled: date disabled 2015 I need assistance with the following:  feeding, dressing, bathing, toileting, meal prep, household duties and shopping  Neuro/Psych weakness tingling anxiety  Prior Studies Any changes since last visit?  no  Physicians involved in your care Any changes since last visit?  no   No family history on file. Social History   Social History  . Marital status: Divorced    Spouse name: N/A  . Number of children: N/A  . Years of education: N/A   Social History Main Topics  . Smoking status: Former Smoker    Packs/day: 0.25    Years: 16.00    Types: Cigarettes    Quit date: 01/04/2014  . Smokeless tobacco: Never Used  . Alcohol use Yes     Comment: occasional drinker  . Drug use: No  . Sexual activity: Not Asked   Other Topics Concern  . None   Social History Narrative  . None   Past  Surgical History:  Procedure Laterality Date  . ABDOMINAL HYSTERECTOMY    . ORIF ELBOW FRACTURE Left 01/04/2014   Procedure: OPEN REDUCTION INTERNAL FIXATION (ORIF) ELBOW/OLECRANON FRACTURE;  Surgeon: Alta Corning, MD;  Location: [place];  Service: Orthopedics;  Laterality: Left;  . ORIF HUMERUS FRACTURE Left 01/04/2014   Procedure: OPEN REDUCTION INTERNAL FIXATION (ORIF) HUMERAL SHAFT FRACTURE;  Surgeon: Alta Corning, MD;  Location: [place];  Service: Orthopedics;  Laterality: Left;  . ORIF RADIAL FRACTURE Left 01/04/2014   Procedure: OPEN REDUCTION INTERNAL FIXATION (ORIF) RADIAL FRACTURE;  Surgeon: Alta Corning, MD;  Location: [place];  Service: Orthopedics;  Laterality: Left;  . ORIF ULNAR FRACTURE Left 01/04/2014   Procedure: OPEN REDUCTION INTERNAL FIXATION (ORIF) ULNAR FRACTURE;  Surgeon: Alta Corning, MD;  Location: [place];  Service: Orthopedics;  Laterality: Left;  . PEG PLACEMENT N/A 01/12/2014   Procedure: PERCUTANEOUS ENDOSCOPIC GASTROSTOMY (PEG) PLACEMENT;  Surgeon: Gwenyth Ober, MD;  Location: [place];  Service: General;  Laterality: N/A;    bedside trach and peg  . PERCUTANEOUS TRACHEOSTOMY N/A 01/12/2014   Procedure: PERCUTANEOUS TRACHEOSTOMY;  Surgeon: Gwenyth Ober, MD;  Location: [place];  Service: General;  Laterality: N/A;  BEDSIDE TRACH   No past medical history on file. Pulse 61   SpO2 98%   Opioid Risk Score:   Fall Risk Score:  `1  Depression screen  PHQ 2/9  No flowsheet data found.  Review of Systems  Constitutional: Negative.   HENT: Negative.   Eyes: Negative.   Respiratory: Negative.   Cardiovascular: Negative.   Gastrointestinal: Positive for constipation.  Endocrine: Negative.   Genitourinary: Negative.   Musculoskeletal: Negative.   Skin: Negative.   Allergic/Immunologic: Negative.   Neurological: Negative.   Hematological: Negative.   Psychiatric/Behavioral: Negative.   All other systems reviewed and are negative.      Objective:   Physical  Exam  3/4 tone left bicep---tolerating more movement of the elbow, 2/4 left pec major/minor, 3+/4 finger and wrist flexors. 4/4 left gastroc/soleus. RLE: 3/4 right hamstring. 3/4 right gastroc/tibialis posterior.  Pt is able to volitionally move left and right leg but has little tolerance still for passive movement of spastic segments.  Speech is clearer and better with conversation. She is more attentive. Behavior is more controlled and she's less disinhibited.    HRT: RRR Chest: clear Skin: intact, vaginal area not visualized   Assessment & Plan:  1. TBI with spastic tetraplegia: Continue with HEP. Will pursue -tendon lengthening to left wrist/fingers next month -pursue outpt therapy once she's cleared from a surgical standpoint. It doesn't make sense to pursue now. 2. This patient also requires a personal care attendant at all times.   3. Will pursue further botox after surgery. If Dr Fredna Dow would like [place] to pursue botox for her finger flexors we could  5. Maintain dantrium at 50mg  bid and 150mg  qhs  6.  . Don't recommend sublux sling due to her shoulder/pec tightness.  7. Continue tegretol to 300mg  tid for agitation/ mood lability  8. Constipation: continue daily to bid miralax via tube. Need to be more aggressive. Fiber is ok. rx for florastor  9. Skin:  nystatin cream for vaginal candidiasis  10. Pain relief: tramadol or ibuprofen  prior to therapies and before donning and doffing splint as well as per-operatively    15 minutes of face to face patient care time were spent during this visit. All questions were encouraged and answered. Follow up in 2 months time

## 2017-04-25 ENCOUNTER — Other Ambulatory Visit: Payer: Self-pay | Admitting: Orthopedic Surgery

## 2017-04-26 ENCOUNTER — Other Ambulatory Visit: Payer: Self-pay | Admitting: Physical Medicine & Rehabilitation

## 2017-04-28 ENCOUNTER — Other Ambulatory Visit: Payer: Self-pay | Admitting: *Deleted

## 2017-04-28 MED ORDER — TRAMADOL HCL 50 MG PO TABS: 50.0000 mg | ORAL_TABLET | Freq: Four times a day (QID) | ORAL | 0 refills | Status: DC | PRN

## 2017-05-15 ENCOUNTER — Encounter (HOSPITAL_BASED_OUTPATIENT_CLINIC_OR_DEPARTMENT_OTHER): Payer: Self-pay | Admitting: *Deleted

## 2017-05-15 NOTE — Pre-Procedure Instructions (Signed)
Discussed pt's inability to ambulate or transfer herself from wheelchair to stretcher with Kym Groom, RN.  Pt. OK to come for surgery.

## 2017-05-21 NOTE — Anesthesia Preprocedure Evaluation (Addendum)
Anesthesia Evaluation  Patient identified by MRN, date of birth, ID band Patient awake and Patient unresponsive    Reviewed: Allergy & Precautions, H&P , NPO status , Patient's Chart, lab work & pertinent test results, Unable to perform ROS - Chart review only  Airway Mallampati: III  TM Distance: >3 FB Neck ROM: full  Mouth opening: Limited Mouth Opening  Dental   Pulmonary neg pulmonary ROS, former smoker,    breath sounds clear to auscultation       Cardiovascular hypertension,  Rhythm:regular Rate:Tachycardia     Neuro/Psych Anxiety TBI Spasticity    GI/Hepatic Neg liver ROS, GERD  Medicated and Controlled,  Endo/Other  negative endocrine ROS  Renal/GU negative Renal ROS  negative genitourinary   Musculoskeletal   Abdominal   Peds  Hematology   Anesthesia Other Findings   Reproductive/Obstetrics negative OB ROS                            Anesthesia Physical  Anesthesia Plan  ASA: III  Anesthesia Plan: General   Post-op Pain Management:    Induction: Intravenous  PONV Risk Score and Plan: 2 and Ondansetron, Dexamethasone and Propofol  Airway Management Planned:   Additional Equipment:   Intra-op Plan:   Post-operative Plan:   Informed Consent: I have reviewed the patients History and Physical, chart, labs and discussed the procedure including the risks, benefits and alternatives for the proposed anesthesia with the patient or authorized representative who has indicated his/her understanding and acceptance.   Dental Advisory Given  Plan Discussed with: CRNA  Anesthesia Plan Comments:        Anesthesia Quick Evaluation

## 2017-05-22 ENCOUNTER — Ambulatory Visit (HOSPITAL_BASED_OUTPATIENT_CLINIC_OR_DEPARTMENT_OTHER)
Admission: RE | Admit: 2017-05-22 | Discharge: 2017-05-22 | Disposition: A | Discharge status: Home / Self Care | Payer: Medicare Other | Source: Ambulatory Visit | Attending: Orthopedic Surgery | Admitting: Orthopedic Surgery

## 2017-05-22 ENCOUNTER — Ambulatory Visit (HOSPITAL_BASED_OUTPATIENT_CLINIC_OR_DEPARTMENT_OTHER): Payer: Medicare Other | Admitting: Anesthesiology

## 2017-05-22 ENCOUNTER — Encounter (HOSPITAL_BASED_OUTPATIENT_CLINIC_OR_DEPARTMENT_OTHER)
Admission: RE | Disposition: A | Discharge status: Home / Self Care | Payer: Self-pay | Source: Ambulatory Visit | Attending: Orthopedic Surgery

## 2017-05-22 ENCOUNTER — Encounter (HOSPITAL_BASED_OUTPATIENT_CLINIC_OR_DEPARTMENT_OTHER): Payer: Self-pay

## 2017-05-22 DIAGNOSIS — G5692 Unspecified mononeuropathy of left upper limb: Secondary | ICD-10-CM | POA: Diagnosis not present

## 2017-05-22 DIAGNOSIS — M24512 Contracture, left shoulder: Secondary | ICD-10-CM | POA: Insufficient documentation

## 2017-05-22 DIAGNOSIS — Z87891 Personal history of nicotine dependence: Secondary | ICD-10-CM | POA: Insufficient documentation

## 2017-05-22 DIAGNOSIS — F419 Anxiety disorder, unspecified: Secondary | ICD-10-CM | POA: Insufficient documentation

## 2017-05-22 DIAGNOSIS — I10 Essential (primary) hypertension: Secondary | ICD-10-CM | POA: Insufficient documentation

## 2017-05-22 DIAGNOSIS — Z87820 Personal history of traumatic brain injury: Secondary | ICD-10-CM | POA: Insufficient documentation

## 2017-05-22 DIAGNOSIS — K219 Gastro-esophageal reflux disease without esophagitis: Secondary | ICD-10-CM | POA: Diagnosis not present

## 2017-05-22 DIAGNOSIS — M24522 Contracture, left elbow: Secondary | ICD-10-CM | POA: Diagnosis not present

## 2017-05-22 DIAGNOSIS — M24542 Contracture, left hand: Secondary | ICD-10-CM | POA: Insufficient documentation

## 2017-05-22 DIAGNOSIS — G8324 Monoplegia of upper limb affecting left nondominant side: Secondary | ICD-10-CM | POA: Insufficient documentation

## 2017-05-22 DIAGNOSIS — Z79899 Other long term (current) drug therapy: Secondary | ICD-10-CM | POA: Diagnosis not present

## 2017-05-22 DIAGNOSIS — S069X9S Unspecified intracranial injury with loss of consciousness of unspecified duration, sequela: Secondary | ICD-10-CM | POA: Diagnosis not present

## 2017-05-22 DIAGNOSIS — M21242 Flexion deformity, left finger joints: Secondary | ICD-10-CM | POA: Diagnosis not present

## 2017-05-22 DIAGNOSIS — R252 Cramp and spasm: Secondary | ICD-10-CM | POA: Diagnosis not present

## 2017-05-22 DIAGNOSIS — M79642 Pain in left hand: Secondary | ICD-10-CM | POA: Diagnosis not present

## 2017-05-22 DIAGNOSIS — J9600 Acute respiratory failure, unspecified whether with hypoxia or hypercapnia: Secondary | ICD-10-CM | POA: Diagnosis not present

## 2017-05-22 HISTORY — DX: Gastro-esophageal reflux disease without esophagitis: K21.9

## 2017-05-22 HISTORY — DX: Difficulty in walking, not elsewhere classified: R26.2

## 2017-05-22 HISTORY — PX: TENDON TRANSFER: SHX6109

## 2017-05-22 HISTORY — DX: Other complications of anesthesia, initial encounter: T88.59XA

## 2017-05-22 HISTORY — DX: Essential (primary) hypertension: I10

## 2017-05-22 HISTORY — DX: Personal history of traumatic brain injury: Z87.820

## 2017-05-22 HISTORY — DX: Unspecified subluxation of left shoulder joint, initial encounter: S43.002A

## 2017-05-22 HISTORY — DX: Cramp and spasm: R25.2

## 2017-05-22 HISTORY — DX: Adverse effect of unspecified anesthetic, initial encounter: T41.45XA

## 2017-05-22 SURGERY — TRANSFER, TENDON
Anesthesia: General | Site: Hand | Laterality: Left

## 2017-05-22 MED ORDER — OXYCODONE HCL 5 MG PO TABS
ORAL_TABLET | ORAL | Status: AC
Start: 2017-05-22 — End: 2017-05-22
  Filled 2017-05-22: qty 1

## 2017-05-22 MED ORDER — VANCOMYCIN HCL IN DEXTROSE 1-5 GM/200ML-% IV SOLN
1000.0000 mg | INTRAVENOUS | Status: AC
  Administered 2017-05-22: 1000 mg via INTRAVENOUS

## 2017-05-22 MED ORDER — MIDAZOLAM HCL 2 MG/2ML IJ SOLN: 1.0000 mg | INTRAMUSCULAR | Status: DC | PRN

## 2017-05-22 MED ORDER — LACTATED RINGERS IV SOLN
INTRAVENOUS | Status: DC
  Administered 2017-05-22: 08:00:00 via INTRAVENOUS

## 2017-05-22 MED ORDER — FENTANYL CITRATE (PF) 100 MCG/2ML IJ SOLN
INTRAMUSCULAR | Status: AC
  Filled 2017-05-22: qty 2

## 2017-05-22 MED ORDER — PROPOFOL 10 MG/ML IV BOLUS
INTRAVENOUS | Status: DC | PRN
  Administered 2017-05-22: 20 mg via INTRAVENOUS
  Administered 2017-05-22: 150 mg via INTRAVENOUS

## 2017-05-22 MED ORDER — BUPIVACAINE HCL (PF) 0.25 % IJ SOLN
INTRAMUSCULAR | Status: AC
  Filled 2017-05-22: qty 30

## 2017-05-22 MED ORDER — KETOROLAC TROMETHAMINE 30 MG/ML IJ SOLN
INTRAMUSCULAR | Status: DC | PRN
  Administered 2017-05-22: 30 mg via INTRAVENOUS

## 2017-05-22 MED ORDER — CHLORHEXIDINE GLUCONATE 4 % EX LIQD: 60.0000 mL | Freq: Once | CUTANEOUS | Status: DC

## 2017-05-22 MED ORDER — FENTANYL CITRATE (PF) 100 MCG/2ML IJ SOLN
50.0000 ug | INTRAMUSCULAR | Status: DC | PRN
Start: 2017-05-22 — End: 2017-05-22
  Administered 2017-05-22: 25 ug via INTRAVENOUS
  Administered 2017-05-22: 100 ug via INTRAVENOUS

## 2017-05-22 MED ORDER — VANCOMYCIN HCL IN DEXTROSE 1-5 GM/200ML-% IV SOLN
INTRAVENOUS | Status: AC
  Filled 2017-05-22: qty 200

## 2017-05-22 MED ORDER — LIDOCAINE 2% (20 MG/ML) 5 ML SYRINGE
INTRAMUSCULAR | Status: DC | PRN
  Administered 2017-05-22: 40 mg via INTRAVENOUS

## 2017-05-22 MED ORDER — ACETAMINOPHEN 10 MG/ML IV SOLN
1000.0000 mg | Freq: Once | INTRAVENOUS | Status: AC
  Administered 2017-05-22: 1000 mg via INTRAVENOUS

## 2017-05-22 MED ORDER — DEXAMETHASONE SODIUM PHOSPHATE 4 MG/ML IJ SOLN
INTRAMUSCULAR | Status: DC | PRN
  Administered 2017-05-22: 10 mg via INTRAVENOUS

## 2017-05-22 MED ORDER — BUPIVACAINE HCL (PF) 0.25 % IJ SOLN
INTRAMUSCULAR | Status: DC | PRN
  Administered 2017-05-22: 10 mL

## 2017-05-22 MED ORDER — OXYCODONE-ACETAMINOPHEN 5-325 MG PO TABS: 1.0000 | ORAL_TABLET | ORAL | 0 refills | Status: DC | PRN

## 2017-05-22 MED ORDER — ONDANSETRON HCL 4 MG/2ML IJ SOLN
INTRAMUSCULAR | Status: DC | PRN
  Administered 2017-05-22: 4 mg via INTRAVENOUS

## 2017-05-22 MED ORDER — KETOROLAC TROMETHAMINE 30 MG/ML IJ SOLN
INTRAMUSCULAR | Status: AC
Start: 2017-05-22 — End: 2017-05-22
  Filled 2017-05-22: qty 1

## 2017-05-22 MED ORDER — ACETAMINOPHEN 10 MG/ML IV SOLN
INTRAVENOUS | Status: AC
  Filled 2017-05-22: qty 100

## 2017-05-22 MED ORDER — FENTANYL CITRATE (PF) 100 MCG/2ML IJ SOLN: 25.0000 ug | INTRAMUSCULAR | Status: DC | PRN

## 2017-05-22 MED ORDER — SCOPOLAMINE 1 MG/3DAYS TD PT72: 1.0000 | MEDICATED_PATCH | Freq: Once | TRANSDERMAL | Status: DC | PRN

## 2017-05-22 MED ORDER — OXYCODONE HCL 5 MG PO TABS
5.0000 mg | ORAL_TABLET | Freq: Once | ORAL | Status: AC
  Administered 2017-05-22: 5 mg via ORAL

## 2017-05-22 MED ORDER — ONDANSETRON HCL 4 MG/2ML IJ SOLN: 4.0000 mg | Freq: Once | INTRAMUSCULAR | Status: DC | PRN

## 2017-05-22 SURGICAL SUPPLY — 76 items
BAG DECANTER FOR FLEXI CONT (MISCELLANEOUS) IMPLANT
BLADE MINI RND TIP GREEN BEAV (BLADE) IMPLANT
BLADE SURG 15 STRL LF DISP TIS (BLADE) ×2 IMPLANT
BLADE SURG 15 STRL SS (BLADE) ×4
BNDG COHESIVE 3X5 TAN STRL LF (GAUZE/BANDAGES/DRESSINGS) ×3 IMPLANT
BNDG ESMARK 4X9 LF (GAUZE/BANDAGES/DRESSINGS) ×3 IMPLANT
BNDG GAUZE ELAST 4 BULKY (GAUZE/BANDAGES/DRESSINGS) ×3 IMPLANT
CHLORAPREP W/TINT 26ML (MISCELLANEOUS) ×3 IMPLANT
CORDS BIPOLAR (ELECTRODE) ×3 IMPLANT
COTTONBALL LRG STERILE PKG (GAUZE/BANDAGES/DRESSINGS) IMPLANT
COVER BACK TABLE 60X90IN (DRAPES) ×3 IMPLANT
COVER MAYO STAND STRL (DRAPES) ×3 IMPLANT
CUFF TOURNIQUET SINGLE 18IN (TOURNIQUET CUFF) ×3 IMPLANT
DECANTER SPIKE VIAL GLASS SM (MISCELLANEOUS) IMPLANT
DRAIN TLS ROUND 10FR (DRAIN) IMPLANT
DRAPE EXTREMITY T 121X128X90 (DRAPE) ×3 IMPLANT
DRAPE OEC MINIVIEW 54X84 (DRAPES) IMPLANT
DRAPE SURG 17X23 STRL (DRAPES) ×3 IMPLANT
DRSG PAD ABDOMINAL 8X10 ST (GAUZE/BANDAGES/DRESSINGS) IMPLANT
GAUZE SPONGE 4X4 12PLY STRL (GAUZE/BANDAGES/DRESSINGS) ×3 IMPLANT
GAUZE SPONGE 4X4 16PLY XRAY LF (GAUZE/BANDAGES/DRESSINGS) IMPLANT
GAUZE XEROFORM 1X8 LF (GAUZE/BANDAGES/DRESSINGS) ×3 IMPLANT
GLOVE BIO SURGEON STRL SZ 6.5 (GLOVE) ×2 IMPLANT
GLOVE BIO SURGEONS STRL SZ 6.5 (GLOVE) ×1
GLOVE BIOGEL PI IND STRL 7.0 (GLOVE) ×2 IMPLANT
GLOVE BIOGEL PI IND STRL 8 (GLOVE) ×1 IMPLANT
GLOVE BIOGEL PI IND STRL 8.5 (GLOVE) ×1 IMPLANT
GLOVE BIOGEL PI INDICATOR 7.0 (GLOVE) ×4
GLOVE BIOGEL PI INDICATOR 8 (GLOVE) ×2
GLOVE BIOGEL PI INDICATOR 8.5 (GLOVE) ×2
GLOVE SURG ORTHO 8.0 STRL STRW (GLOVE) ×3 IMPLANT
GOWN STRL REUS W/ TWL LRG LVL3 (GOWN DISPOSABLE) ×1 IMPLANT
GOWN STRL REUS W/TWL LRG LVL3 (GOWN DISPOSABLE) ×2
GOWN STRL REUS W/TWL XL LVL3 (GOWN DISPOSABLE) ×6 IMPLANT
K-WIRE .035X4 (WIRE) IMPLANT
LOOP VESSEL MAXI BLUE (MISCELLANEOUS) IMPLANT
NEEDLE HYPO 22GX1.5 SAFETY (NEEDLE) IMPLANT
NEEDLE KEITH (NEEDLE) IMPLANT
NEEDLE PRECISIONGLIDE 27X1.5 (NEEDLE) ×3 IMPLANT
NS IRRIG 1000ML POUR BTL (IV SOLUTION) ×3 IMPLANT
PACK BASIN DAY SURGERY FS (CUSTOM PROCEDURE TRAY) ×3 IMPLANT
PAD CAST 3X4 CTTN HI CHSV (CAST SUPPLIES) ×2 IMPLANT
PADDING CAST ABS 3INX4YD NS (CAST SUPPLIES)
PADDING CAST ABS 4INX4YD NS (CAST SUPPLIES) ×2
PADDING CAST ABS COTTON 3X4 (CAST SUPPLIES) IMPLANT
PADDING CAST ABS COTTON 4X4 ST (CAST SUPPLIES) ×1 IMPLANT
PADDING CAST COTTON 3X4 STRL (CAST SUPPLIES) ×4
SLEEVE SCD COMPRESS KNEE MED (MISCELLANEOUS) ×3 IMPLANT
SPLINT FIBERGLASS 3X35 (CAST SUPPLIES) ×3 IMPLANT
SPLINT PLASTER CAST XFAST 3X15 (CAST SUPPLIES) IMPLANT
SPLINT PLASTER XTRA FASTSET 3X (CAST SUPPLIES)
STOCKINETTE 4X48 STRL (DRAPES) ×3 IMPLANT
SUT CHROMIC 5 0 P 3 (SUTURE) IMPLANT
SUT ETHIBOND 3-0 V-5 (SUTURE) IMPLANT
SUT ETHILON 4 0 PS 2 18 (SUTURE) ×9 IMPLANT
SUT FIBERWIRE 2-0 18 17.9 3/8 (SUTURE) ×3
SUT FIBERWIRE 4-0 18 TAPR NDL (SUTURE)
SUT MERSILENE 2.0 SH NDLE (SUTURE) IMPLANT
SUT MERSILENE 3 0 FS 1 (SUTURE) IMPLANT
SUT MERSILENE 4 0 P 3 (SUTURE) IMPLANT
SUT PROLENE 2 0 SH DA (SUTURE) IMPLANT
SUT SILK 2 0 FS (SUTURE) ×3 IMPLANT
SUT SILK 4 0 PS 2 (SUTURE) IMPLANT
SUT STEEL 3 0 (SUTURE) IMPLANT
SUT VIC AB 3-0 PS1 18 (SUTURE)
SUT VIC AB 3-0 PS1 18XBRD (SUTURE) IMPLANT
SUT VIC AB 4-0 P-3 18XBRD (SUTURE) IMPLANT
SUT VIC AB 4-0 P3 18 (SUTURE)
SUT VICRYL 4-0 PS2 18IN ABS (SUTURE) IMPLANT
SUTURE FIBERWR 2-0 18 17.9 3/8 (SUTURE) ×1 IMPLANT
SUTURE FIBERWR 4-0 18 TAPR NDL (SUTURE) IMPLANT
SYR BULB 3OZ (MISCELLANEOUS) ×3 IMPLANT
SYR CONTROL 10ML LL (SYRINGE) ×3 IMPLANT
TOWEL OR 17X24 6PK STRL BLUE (TOWEL DISPOSABLE) ×6 IMPLANT
TUBE FEEDING ENTERAL 5FR 16IN (TUBING) IMPLANT
UNDERPAD 30X30 (UNDERPADS AND DIAPERS) ×3 IMPLANT

## 2017-05-22 NOTE — Transfer of Care (Signed)
Immediate Anesthesia Transfer of Care Note  Patient: [name]  Procedure(s) Performed: Procedure(s): TRANSFER PROFUNDUS TO SUPERFICIALIS LENGTHENING LEFT FINGERS, PROFUNDUS FUNCTIONAL SUPERFUNTIONALIS WEAK NEURECTOMY ULNAR NERVE WRIST (Left)  Patient Location: PACU  Anesthesia Type:General  Level of Consciousness: awake and sedated  Airway & Oxygen Therapy: Patient Spontanous Breathing  Post-op Assessment: Report given to RN and Post -op Vital signs reviewed and stable  Post vital signs: Reviewed and stable  Last Vitals:  Vitals:   05/22/17 0758  BP: 119/90  Pulse: 71  Resp: 16  Temp: 36.9 C    Last Pain:  Vitals:   05/22/17 0758  TempSrc: Oral         Complications: No apparent anesthesia complications

## 2017-05-22 NOTE — Op Note (Signed)
[id number] 

## 2017-05-22 NOTE — Anesthesia Postprocedure Evaluation (Signed)
Anesthesia Post Note  Patient: [name]  Procedure(s) Performed: Procedure(s) (LRB): TRANSFER PROFUNDUS TO SUPERFICIALIS LENGTHENING LEFT FINGERS, PROFUNDUS FUNCTIONAL SUPERFUNTIONALIS WEAK NEURECTOMY ULNAR NERVE WRIST (Left)     Patient location during evaluation: PACU Anesthesia Type: General Level of consciousness: awake and alert Pain management: pain level controlled Vital Signs Assessment: post-procedure vital signs reviewed and stable Respiratory status: spontaneous breathing, nonlabored ventilation, respiratory function stable and patient connected to nasal cannula oxygen Cardiovascular status: blood pressure returned to baseline and stable Postop Assessment: no signs of nausea or vomiting Anesthetic complications: no    Last Vitals:  Vitals:   05/22/17 1145 05/22/17 1200  BP: 127/74 127/71  Pulse: 72 81  Resp: 17 18  Temp:  36.7 C    Last Pain:  Vitals:   05/22/17 0758  TempSrc: Oral                 Ryan P Ellender
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
## 2017-05-22 NOTE — Brief Op Note (Signed)
05/22/2017  10:55 AM  PATIENT:  [name]  36 y.o. female  PRE-OPERATIVE DIAGNOSIS:  PARALYSIS FLEXOR LEFT HAND  POST-OPERATIVE DIAGNOSIS:  PARALYSIS FLEXOR LEFT HAND  PROCEDURE:  Procedure(s): TRANSFER PROFUNDUS TO SUPERFICIALIS LENGTHENING LEFT FINGERS, PROFUNDUS FUNCTIONAL SUPERFUNTIONALIS WEAK NEURECTOMY ULNAR NERVE WRIST (Left)  SURGEON:  Surgeon(s) and Role:    * Daryll Brod, MD - Primary  PHYSICIAN ASSISTANT:   ASSISTANTS: K Miana Politte,MD   ANESTHESIA:   local and general  EBL:  Total I/O In: 1200 [I.V.:1200] Out: 5 [Blood:5]  BLOOD ADMINISTERED:none  DRAINS: none   LOCAL MEDICATIONS USED:  BUPIVICAINE   SPECIMEN:  No Specimen  DISPOSITION OF SPECIMEN:  N/A  COUNTS:  YES  TOURNIQUET:   Total Tourniquet Time Documented: Upper Arm (Left) - 47 minutes Total: Upper Arm (Left) - 47 minutes   DICTATION: .Other Dictation: Dictation Number [id number]  PLAN OF CARE: Discharge to home after PACU  PATIENT DISPOSITION:  PACU - hemodynamically stable.

## 2017-05-22 NOTE — H&P (Signed)
[name] is an 36 y.o. female.   Chief Complaint: spastic left arm post traumatic brain injury HPI: [name] is a 36 year old right-hand-dominant female for by Dr. Tessa Lerner for consultation regarding a spastic brain injury with contractures and spasticity of her left arm including all fingers and elbow. She is not complaining of any significant pain or discomfort unless he touches or attempted be corrected. She does have feeling in all of the joints have feeling in the radial nerve distribution. She has had fixation of a fracture of the humerus fixation both bone forearm fractures by Dr. Berenice Primas. She has had Botox injections in January. Hygiene is not a particular problem at the present time. She is seen with her mother. She is in a wheelchair. She has her elbow fixed at relatively 90. She has no internal/external rotation or abduction of her shoulder. Continues to complain of spastic deformity to her fingers. Nonfunctional for her. She is not complaining of any significant pain or discomfort at the present timeunless attempted extension occurs. She is complaining of the inability to do anything with her hands with him in a fully flexed position. She has had Botox in the past. Was sent to Dr. Thereasa Parkin for nerve conductions. She shows little to no function in the radial nerve. This is on EMGs of her ECRL ECU.         Past Medical History:  Diagnosis Date  . Complication of anesthesia    severe crying, hypersensitivity to pain  . GERD (gastroesophageal reflux disease)   . History of traumatic brain injury 01/04/2014  . Hypertension    under control with med., has been on med. since 2015  . Spasticity    legs, feet, left arm, left hand  . Subluxation of left shoulder joint 01/04/2014   ongoing  . Unable to walk     Past Surgical History:  Procedure Laterality Date  . ORIF ELBOW FRACTURE Left 01/04/2014   Procedure: OPEN REDUCTION INTERNAL FIXATION (ORIF) ELBOW/OLECRANON FRACTURE;   Surgeon: Alta Corning, MD;  Location: [place];  Service: Orthopedics;  Laterality: Left;  . ORIF HUMERUS FRACTURE Left 01/04/2014   Procedure: OPEN REDUCTION INTERNAL FIXATION (ORIF) HUMERAL SHAFT FRACTURE;  Surgeon: Alta Corning, MD;  Location: [place];  Service: Orthopedics;  Laterality: Left;  . ORIF RADIAL FRACTURE Left 01/04/2014   Procedure: OPEN REDUCTION INTERNAL FIXATION (ORIF) RADIAL FRACTURE;  Surgeon: Alta Corning, MD;  Location: [place];  Service: Orthopedics;  Laterality: Left;  . ORIF ULNAR FRACTURE Left 01/04/2014   Procedure: OPEN REDUCTION INTERNAL FIXATION (ORIF) ULNAR FRACTURE;  Surgeon: Alta Corning, MD;  Location: [place];  Service: Orthopedics;  Laterality: Left;  . PEG PLACEMENT N/A 01/12/2014   Procedure: PERCUTANEOUS ENDOSCOPIC GASTROSTOMY (PEG) PLACEMENT;  Surgeon: Gwenyth Ober, MD;  Location: [place];  Service: General;  Laterality: N/A;    bedside trach and peg  . PEG TUBE REMOVAL  02/13/2015  . PERCUTANEOUS TRACHEOSTOMY N/A 01/12/2014   Procedure: PERCUTANEOUS TRACHEOSTOMY;  Surgeon: Gwenyth Ober, MD;  Location: [place];  Service: General;  Laterality: N/A;  BEDSIDE TRACH  . TUBAL LIGATION  2003    History reviewed. No pertinent family history. Social History:  reports that she quit smoking about 3 years ago. She has never used smokeless tobacco. She reports that she does not drink alcohol or use drugs.  Allergies:  Allergies  Allergen Reactions  . Clonazepam Anxiety  . Penicillins Swelling  . Ace Inhibitors Other (See Comments)  UNKNOWN    Medications Prior to Admission  Medication Sig Dispense Refill  . baclofen (LIORESAL) 20 MG tablet Take 20 mg by mouth 4 (four) times daily.    . calcium carbonate (TUMS EX) 750 MG chewable tablet Chew 2 tablets by mouth daily.    . carbamazepine (TEGRETOL) 200 MG tablet Take 200 mg by mouth 4 (four) times daily.    . citalopram (CELEXA) 40 MG tablet TAKE 1 TABLET BY MOUTH AT BEDTIME. 30 tablet 0  . dantrolene (DANTRIUM)  50 MG capsule Take 50 mg by mouth 3 (three) times daily.    Marland Kitchen LORazepam (ATIVAN) 0.5 MG tablet TAKE (1) TABLET EVERY SIX HOURS AS NEEDED FOR ANXIETY. 90 tablet 0  . meloxicam (MOBIC) 7.5 MG tablet TAKE 1 TABLET ONCE DAILY. 30 tablet 0  . Multiple Vitamin (MULTIVITAMIN) tablet Take 1 tablet by mouth daily.    . Nutritional Supplements (BOOST GLUCOSE CONTROL) LIQD Take 1 Can by mouth 2 (two) times daily. 14220 mL 0  . Omega-3 Fatty Acids (FISH OIL PO) Take by mouth daily.    . pantoprazole (PROTONIX) 40 MG tablet Take 40 mg by mouth 2 (two) times daily.    . propranolol (INDERAL) 40 MG tablet TAKE 1 TABLET BY MOUTH 3 TIMES DAILY. 90 tablet 0  . QUEtiapine (SEROQUEL) 100 MG tablet Take 150 mg by mouth at bedtime.    Marland Kitchen QUEtiapine (SEROQUEL) 50 MG tablet TAKE (1) TABLET TWICE DAILY. 60 tablet 0  . traMADol (ULTRAM) 50 MG tablet Take by mouth 2 (two) times daily.    . traZODone (DESYREL) 100 MG tablet Take 100 mg by mouth at bedtime.    . TURMERIC PO Take by mouth daily.    . Wheat Dextrin (BENEFIBER PO) Take by mouth as needed.      No results found for this or any previous visit (from the past 48 hour(s)).  No results found.   Pertinent items are noted in HPI.  Height 5\' 5"  (1.651 m), weight 74.4 kg (164 lb), last menstrual period 05/09/2017.  General appearance: alert, cooperative and appears stated age Head: Normocephalic, without obvious abnormality Neck: no JVD Resp: clear to auscultation bilaterally Cardio: regular rate and rhythm, S1, S2 normal, no murmur, click, rub or gallop GI: soft, non-tender; bowel sounds normal; no masses,  no organomegaly Extremities: marked flexion of the fingersesp mcp's and spasticity left hand Pulses: 2+ and symmetric Skin: Skin color, texture, turgor normal. No rashes or lesions Neurologic: Grossly normal Incision/Wound: na  Assessment/Plan Assessment:  1. Traumatic brain injury with loss of consciousness,  2. Spastic tetraplegia    Plan: We  have discussed with her mother superficialis to profundus tendon transfers. This will functionally lengthen them. Pre peri and postoperative course are discussed along with risk complications. We will also plan on neurectomy of the deep motor branch of the ulnar nerve. They are aware that there is no guarantee to the surgery possibility of infection recurrence injury to arteries nerves tendons would like to proceed.      Delancey Moraes R 05/22/2017, 7:56 AM

## 2017-05-22 NOTE — Discharge Instructions (Addendum)

## 2017-05-22 NOTE — Anesthesia Procedure Notes (Signed)
Procedure Name: LMA Insertion Performed by: Semisi Biela W Pre-anesthesia Checklist: Patient identified, Emergency Drugs available, Suction available and Patient being monitored Patient Re-evaluated:Patient Re-evaluated prior to inductionOxygen Delivery Method: Circle system utilized Preoxygenation: Pre-oxygenation with 100% oxygen Intubation Type: IV induction Ventilation: Mask ventilation without difficulty LMA: LMA inserted LMA Size: 4.0 Number of attempts: 1 Placement Confirmation: positive ETCO2 Tube secured with: Tape Dental Injury: Teeth and Oropharynx as per pre-operative assessment        

## 2017-05-22 NOTE — Op Note (Signed)
I assisted Surgeon(s) and Role:    * Daryll Brod, MD - Primary on the Procedure(s): TRANSFER PROFUNDUS TO SUPERFICIALIS LENGTHENING LEFT FINGERS, PROFUNDUS FUNCTIONAL SUPERFUNTIONALIS WEAK NEURECTOMY ULNAR NERVE WRIST on 05/22/2017.  I provided assistance on this case as follows: retraction soft tissues, identification of tendons/nerves, passing suture, closure wound.  Electronically signed by: Tennis Must, MD Date: 05/22/2017 Time: 10:51 AM

## 2017-05-23 ENCOUNTER — Encounter (HOSPITAL_BASED_OUTPATIENT_CLINIC_OR_DEPARTMENT_OTHER): Payer: Self-pay | Admitting: Orthopedic Surgery

## 2017-05-26 ENCOUNTER — Other Ambulatory Visit: Payer: Self-pay | Admitting: Physical Medicine & Rehabilitation

## 2017-05-30 DIAGNOSIS — M25639 Stiffness of unspecified wrist, not elsewhere classified: Secondary | ICD-10-CM | POA: Diagnosis not present

## 2017-05-30 DIAGNOSIS — M25649 Stiffness of unspecified hand, not elsewhere classified: Secondary | ICD-10-CM | POA: Diagnosis not present

## 2017-05-30 DIAGNOSIS — G8250 Quadriplegia, unspecified: Secondary | ICD-10-CM | POA: Diagnosis not present

## 2017-05-30 DIAGNOSIS — M25622 Stiffness of left elbow, not elsewhere classified: Secondary | ICD-10-CM | POA: Diagnosis not present

## 2017-06-04 ENCOUNTER — Telehealth: Payer: Self-pay | Admitting: *Deleted

## 2017-06-04 NOTE — Telephone Encounter (Addendum)
Patient's mother called and left a message stating that patient had hand surgery recently by Dr. Fredna Dow.  She was told that Dr. Fredna Dow was not going to continue with pain management and that future refills will need to come from her pain management doctor. #1 Dr. [name] is on vacation in [place], #2 patient is seen here at [facility] for spasticity (Botox injections) and therapy orders. She does not receive anything stronger tha tramadol from Dr. [name]. Anytime a patient from our clinic goes under the care of a surgeon, it is normal practice for the surgeon to continue prescribing post-op analgesics until they are deemed no longer necessary and patient is released from the surgeons care. We need Dr. Levell July assistance on this, Dr. [name] will return next week from his vacation and then perhaps a proper hand off can be made.

## 2017-06-04 NOTE — Telephone Encounter (Signed)
Patient's mother left a message indicating that Dr. Fredna Dow was no longer going to prescribe pain medication follow ing patient's hand surgery.  I consulted [name] our nurse and eunice our nurse practitioner.  They both agreed that with Dr. [name] being on vacation that the only viable option is for Dr. Fredna Dow to continue to prescribe. A phone message was sent to Dr. Fredna Dow in this regard and Miss Alcario Drought was contacted.  It is unfortunate that the patient only has 1 tablet left. However, wth Dr. [name] on vacation not much can be done at this immediate moment

## 2017-06-12 ENCOUNTER — Encounter: Payer: Self-pay | Admitting: Physical Medicine & Rehabilitation

## 2017-06-12 DIAGNOSIS — N319 Neuromuscular dysfunction of bladder, unspecified: Secondary | ICD-10-CM

## 2017-06-13 ENCOUNTER — Encounter: Payer: Self-pay | Admitting: Physical Medicine & Rehabilitation

## 2017-06-23 ENCOUNTER — Other Ambulatory Visit: Payer: Self-pay | Admitting: Physical Medicine & Rehabilitation

## 2017-06-23 ENCOUNTER — Ambulatory Visit: Payer: Medicare Other | Admitting: Physical Medicine & Rehabilitation

## 2017-06-30 ENCOUNTER — Other Ambulatory Visit: Payer: Self-pay | Admitting: Physical Medicine & Rehabilitation

## 2017-07-01 DIAGNOSIS — G8250 Quadriplegia, unspecified: Secondary | ICD-10-CM | POA: Diagnosis not present

## 2017-07-01 DIAGNOSIS — S069X9A Unspecified intracranial injury with loss of consciousness of unspecified duration, initial encounter: Secondary | ICD-10-CM | POA: Diagnosis not present

## 2017-07-01 DIAGNOSIS — M25649 Stiffness of unspecified hand, not elsewhere classified: Secondary | ICD-10-CM | POA: Diagnosis not present

## 2017-07-01 DIAGNOSIS — M25639 Stiffness of unspecified wrist, not elsewhere classified: Secondary | ICD-10-CM | POA: Diagnosis not present

## 2017-07-15 DIAGNOSIS — M25649 Stiffness of unspecified hand, not elsewhere classified: Secondary | ICD-10-CM | POA: Diagnosis not present

## 2017-07-21 ENCOUNTER — Encounter: Payer: Medicare Other | Attending: Physical Medicine & Rehabilitation | Admitting: Physical Medicine & Rehabilitation

## 2017-07-21 DIAGNOSIS — Z90710 Acquired absence of both cervix and uterus: Secondary | ICD-10-CM | POA: Insufficient documentation

## 2017-07-21 DIAGNOSIS — Z87891 Personal history of nicotine dependence: Secondary | ICD-10-CM | POA: Insufficient documentation

## 2017-07-21 DIAGNOSIS — K5900 Constipation, unspecified: Secondary | ICD-10-CM | POA: Insufficient documentation

## 2017-07-21 DIAGNOSIS — G8250 Quadriplegia, unspecified: Secondary | ICD-10-CM | POA: Insufficient documentation

## 2017-07-21 DIAGNOSIS — B373 Candidiasis of vulva and vagina: Secondary | ICD-10-CM | POA: Insufficient documentation

## 2017-07-22 DIAGNOSIS — M25649 Stiffness of unspecified hand, not elsewhere classified: Secondary | ICD-10-CM | POA: Diagnosis not present

## 2017-07-25 ENCOUNTER — Other Ambulatory Visit: Payer: Self-pay | Admitting: Physical Medicine & Rehabilitation

## 2017-07-25 NOTE — Telephone Encounter (Signed)
Recieved electronic medication refill request for all these medications:  Baclofen Tegretol celexa Dantrium Ativan mobic Nutritional supplements protonix Inderal seroquel trazadone  Is it ok to refill ALL these medications?    Please advise

## 2017-07-28 ENCOUNTER — Other Ambulatory Visit: Payer: Self-pay | Admitting: Physical Medicine & Rehabilitation

## 2017-07-28 NOTE — Telephone Encounter (Signed)
Recieved electronic medication refill request for meloxicam, no mention in previous notes as to continue this medication or if the patient is taking this medicine, is it ok to refill this medication?

## 2017-07-31 ENCOUNTER — Other Ambulatory Visit: Payer: Self-pay | Admitting: *Deleted

## 2017-07-31 MED ORDER — MELOXICAM 7.5 MG PO TABS: 7.5000 mg | ORAL_TABLET | Freq: Every day | ORAL | 0 refills | Status: DC

## 2017-08-04 ENCOUNTER — Other Ambulatory Visit: Payer: Self-pay | Admitting: Physical Medicine & Rehabilitation

## 2017-08-04 ENCOUNTER — Encounter: Payer: Self-pay | Admitting: Physical Medicine & Rehabilitation

## 2017-08-04 ENCOUNTER — Telehealth: Payer: Self-pay | Admitting: *Deleted

## 2017-08-04 MED ORDER — TRAMADOL HCL 50 MG PO TABS: ORAL_TABLET | ORAL | 1 refills | Status: DC

## 2017-08-04 NOTE — Telephone Encounter (Signed)
May refill tramadol #90

## 2017-08-04 NOTE — Telephone Encounter (Signed)
Called to pharmacy 

## 2017-08-04 NOTE — Telephone Encounter (Signed)
traMADol (ULTRAM) 50 MG tablet Alger Simons T, MD]   Patient Comment: [name] is no longer under [facility] care. She is out of Tramadol and she hurts almost all the time ...back, elbow, spine, knee   (Last filled #30 on 07/01/17 by Fredna Dow)

## 2017-08-15 ENCOUNTER — Telehealth: Payer: Self-pay | Admitting: *Deleted

## 2017-08-15 NOTE — Telephone Encounter (Signed)
Corene Cornea from [facility] called to report that they cannot fulfill order for rolling supportive reclining shower chair.  He recommends [facility] instead. Rx script diverted to [facility].

## 2017-08-25 ENCOUNTER — Other Ambulatory Visit: Payer: Self-pay | Admitting: Physical Medicine & Rehabilitation

## 2017-08-29 ENCOUNTER — Encounter: Payer: Self-pay | Admitting: Physical Medicine & Rehabilitation

## 2017-09-02 ENCOUNTER — Encounter: Payer: Medicare Other | Attending: Physical Medicine & Rehabilitation | Admitting: Physical Medicine & Rehabilitation

## 2017-09-02 ENCOUNTER — Encounter: Payer: Self-pay | Admitting: Physical Medicine & Rehabilitation

## 2017-09-02 ENCOUNTER — Other Ambulatory Visit: Payer: Self-pay | Admitting: *Deleted

## 2017-09-02 VITALS — BP 111/77 | HR 76

## 2017-09-02 DIAGNOSIS — S52202S Unspecified fracture of shaft of left ulna, sequela: Secondary | ICD-10-CM | POA: Diagnosis not present

## 2017-09-02 DIAGNOSIS — S069X0S Unspecified intracranial injury without loss of consciousness, sequela: Secondary | ICD-10-CM

## 2017-09-02 DIAGNOSIS — G8250 Quadriplegia, unspecified: Secondary | ICD-10-CM

## 2017-09-02 DIAGNOSIS — K5900 Constipation, unspecified: Secondary | ICD-10-CM | POA: Insufficient documentation

## 2017-09-02 DIAGNOSIS — S52302S Unspecified fracture of shaft of left radius, sequela: Secondary | ICD-10-CM | POA: Diagnosis not present

## 2017-09-02 DIAGNOSIS — R4189 Other symptoms and signs involving cognitive functions and awareness: Secondary | ICD-10-CM

## 2017-09-02 DIAGNOSIS — Z90710 Acquired absence of both cervix and uterus: Secondary | ICD-10-CM | POA: Diagnosis not present

## 2017-09-02 DIAGNOSIS — F068 Other specified mental disorders due to known physiological condition: Secondary | ICD-10-CM

## 2017-09-02 DIAGNOSIS — B373 Candidiasis of vulva and vagina: Secondary | ICD-10-CM | POA: Diagnosis not present

## 2017-09-02 DIAGNOSIS — Z87891 Personal history of nicotine dependence: Secondary | ICD-10-CM | POA: Insufficient documentation

## 2017-09-02 NOTE — Progress Notes (Addendum)
Subjective:    Patient ID: [name], female    DOB: 12/25/1980, 36 y.o.   MRN: [id number]  HPI  [name] is here in follow up of her TBI. She had hand surgery in June. Mom has been doing HEP with [name] at home. She has gained rom at the wrist and fingers. She is able to move hand now. Pain is better in the hand. However the left elbow is problematic. She has a hard time moving it out of flexion due to pain. During surgery, the elbow was extended and placed in a brace but she did not tolerate the brace very long due to pain/skin issues. Ortho is considering sending [name] to an elbow expert.   Otherwise [name] has been doing well. Her mom remains very supportive. [name] as a whole is more interactive and bheaviorally appropriately.    Pain Inventory Average Pain 2 Pain Right Now 0 My pain is stabbing and aching  In the last 24 hours, has pain interfered with the following? General activity 0 Relation with others 2 Enjoyment of life 2 What TIME of day is your pain at its worst? evening Sleep (in general) Good  Pain is worse with: inactivity and some activites Pain improves with: rest, pacing activities and medication Relief from Meds: .  Mobility ability to climb steps?  no do you drive?  no use a wheelchair needs help with transfers  Function disabled: date disabled 2015 I need assistance with the following:  feeding, dressing, bathing, toileting, meal prep, household duties and shopping  Neuro/Psych numbness trouble walking confusion anxiety  Prior Studies Any changes since last visit?  no  Physicians involved in your care Any changes since last visit?  no   No family history on file. Social History   Social History  . Marital status: Divorced    Spouse name: N/A  . Number of children: N/A  . Years of education: N/A   Social History Main Topics  . Smoking status: Former Smoker    Quit date: 01/04/2014  . Smokeless tobacco: Never Used  .  Alcohol use No  . Drug use: No  . Sexual activity: Not on file   Other Topics Concern  . Not on file   Social History Narrative  . No narrative on file   Past Surgical History:  Procedure Laterality Date  . ORIF ELBOW FRACTURE Left 01/04/2014   Procedure: OPEN REDUCTION INTERNAL FIXATION (ORIF) ELBOW/OLECRANON FRACTURE;  Surgeon: Alta Corning, MD;  Location: [facility];  Service: Orthopedics;  Laterality: Left;  . ORIF HUMERUS FRACTURE Left 01/04/2014   Procedure: OPEN REDUCTION INTERNAL FIXATION (ORIF) HUMERAL SHAFT FRACTURE;  Surgeon: Alta Corning, MD;  Location: [facility];  Service: Orthopedics;  Laterality: Left;  . ORIF RADIAL FRACTURE Left 01/04/2014   Procedure: OPEN REDUCTION INTERNAL FIXATION (ORIF) RADIAL FRACTURE;  Surgeon: Alta Corning, MD;  Location: [facility];  Service: Orthopedics;  Laterality: Left;  . ORIF ULNAR FRACTURE Left 01/04/2014   Procedure: OPEN REDUCTION INTERNAL FIXATION (ORIF) ULNAR FRACTURE;  Surgeon: Alta Corning, MD;  Location: [facility];  Service: Orthopedics;  Laterality: Left;  . PEG PLACEMENT N/A 01/12/2014   Procedure: PERCUTANEOUS ENDOSCOPIC GASTROSTOMY (PEG) PLACEMENT;  Surgeon: Gwenyth Ober, MD;  Location: [facility];  Service: General;  Laterality: N/A;    bedside trach and peg  . PEG TUBE REMOVAL  02/13/2015  . PERCUTANEOUS TRACHEOSTOMY N/A 01/12/2014   Procedure: PERCUTANEOUS TRACHEOSTOMY;  Surgeon: Gwenyth Ober, MD;  Location:  [facility];  Service: General;  Laterality: N/A;  BEDSIDE TRACH  . TENDON TRANSFER Left 05/22/2017   Procedure: TRANSFER PROFUNDUS TO SUPERFICIALIS LENGTHENING LEFT FINGERS, PROFUNDUS FUNCTIONAL SUPERFUNTIONALIS WEAK NEURECTOMY ULNAR NERVE WRIST;  Surgeon: Daryll Brod, MD;  Location: [facility];  Service: Orthopedics;  Laterality: Left;  . TUBAL LIGATION  2003   Past Medical History:  Diagnosis Date  . Complication of anesthesia    severe crying, hypersensitivity to pain  . GERD (gastroesophageal reflux disease)   . History  of traumatic brain injury 01/04/2014  . Hypertension    under control with med., has been on med. since 2015  . Spasticity    legs, feet, left arm, left hand  . Subluxation of left shoulder joint 01/04/2014   ongoing  . Unable to walk    There were no vitals taken for this visit.  Opioid Risk Score:   Fall Risk Score:  `1  Depression screen PHQ 2/9  No flowsheet data found.   Review of Systems  Constitutional: Positive for unexpected weight change.  HENT: Negative.   Eyes: Negative.   Respiratory: Negative.   Cardiovascular: Negative.   Gastrointestinal: Positive for constipation.  Endocrine: Negative.   Genitourinary: Negative.   Musculoskeletal: Negative.   Skin: Negative.   Allergic/Immunologic: Negative.   Neurological: Negative.   Hematological: Negative.   Psychiatric/Behavioral: Negative.   All other systems reviewed and are negative.      Objective:   Physical Exam 3-4/4 tone left bicep---blocking movement due to pain. 2/4 left pec major/minor, tr-1/4 finger and wrist flexors--able to flex fingers and thumb with 1-2/5. 4/4 left gastroc/soleus. RLE: 3/4 right hamstring. 3/4 right gastroc/tibialis posterior.  LE unchanged Speech is clearer and better with conversation. She is more attentive. Behavior is more controlled and she's less disinhibited.    HRT: RRR without murmur. No JVD  Chest: CTA Bilaterally without wheezes or rales. Normal effort  Skin: intact, vaginal area not visualized   Assessment & Plan:  1. TBI with spastic tetraplegia: Continue with HEP. Will pursue -tendon lengthening to left wrist/fingers with good results so far -refer to  outpt therapy once she's cleared from a surgical standpoint.   -may refer to ortho for left heel cord contractures also, but we need to address LUE first.  2. This patient also requires a personal care attendant at all times.  3. Will pursue dysport 1000u to left biceps,brachialis, brachioradialis 5. Maintain  dantrium at 50mg  bid and 150mg  qhs 6. Will also refer for SLP and PT  7. Continue tegretol to 300mg  tid for agitation/ mood lability  8. Constipation: continue daily to bid miralax via tube. Need to be more aggressive. Fiber is ok. rx for florastor  9. Skin:  nystatin cream for vaginal candidiasis 10. Pain relief: tramadol or ibuprofen  prior to therapies and prn    20minutes of face to face patient care time were spent during this visit. All questions were encouraged and answered. Follow up in 1 months time

## 2017-09-02 NOTE — Telephone Encounter (Signed)
Please proceed. thx

## 2017-09-02 NOTE — Telephone Encounter (Signed)
rec'd fax from pt's pharmacy asking for refill on lorazepam 0.5 mg tab, #90, take 1 tablet every six hours prn.  May we proceed

## 2017-09-02 NOTE — Patient Instructions (Signed)
PLEASE FEEL FREE TO CALL OUR OFFICE WITH ANY PROBLEMS OR QUESTIONS ([phone])      

## 2017-09-03 MED ORDER — LORAZEPAM 0.5 MG PO TABS: ORAL_TABLET | ORAL | 0 refills | Status: DC

## 2017-09-29 ENCOUNTER — Other Ambulatory Visit: Payer: Self-pay | Admitting: Physical Medicine & Rehabilitation

## 2017-10-07 ENCOUNTER — Encounter: Payer: Self-pay | Admitting: Physical Medicine & Rehabilitation

## 2017-10-07 ENCOUNTER — Encounter: Payer: Medicare Other | Attending: Physical Medicine & Rehabilitation | Admitting: Physical Medicine & Rehabilitation

## 2017-10-07 VITALS — BP 107/74 | HR 72

## 2017-10-07 DIAGNOSIS — F068 Other specified mental disorders due to known physiological condition: Secondary | ICD-10-CM | POA: Diagnosis not present

## 2017-10-07 DIAGNOSIS — Z90710 Acquired absence of both cervix and uterus: Secondary | ICD-10-CM | POA: Diagnosis not present

## 2017-10-07 DIAGNOSIS — G8250 Quadriplegia, unspecified: Secondary | ICD-10-CM | POA: Diagnosis not present

## 2017-10-07 DIAGNOSIS — Z87820 Personal history of traumatic brain injury: Secondary | ICD-10-CM | POA: Diagnosis not present

## 2017-10-07 DIAGNOSIS — K5900 Constipation, unspecified: Secondary | ICD-10-CM | POA: Insufficient documentation

## 2017-10-07 DIAGNOSIS — S069X0S Unspecified intracranial injury without loss of consciousness, sequela: Secondary | ICD-10-CM | POA: Diagnosis not present

## 2017-10-07 DIAGNOSIS — Z87891 Personal history of nicotine dependence: Secondary | ICD-10-CM | POA: Insufficient documentation

## 2017-10-07 DIAGNOSIS — B373 Candidiasis of vulva and vagina: Secondary | ICD-10-CM | POA: Insufficient documentation

## 2017-10-07 NOTE — Progress Notes (Signed)
Dysport Injection for spasticity using needle EMG guidance Indication:  Spastic tetraplegia, left arm focused upon today   Dilution: 500 Units/52ml        Total Units Injected:  1000 Indication: Severe spasticity which interferes with ADL,mobility and/or  hygiene and is unresponsive to medication management and other conservative care Informed consent was obtained after describing risks and benefits of the procedure with the patient. This includes bleeding, bruising, infection, excessive weakness, or medication side effects. A REMS form is on file and signed.  Needle: 38mm injectable monopolar needle electrode  Number of units per muscle Pectoralis Major 200 units Pectoralis Minor 200 units Biceps 500 units Brachioradialis 100 units FCR 0 units FCU 0 units FDS 0 units FDP 0 units FPL 0 units Pronator Teres 0 units Pronator Quadratus 0 units  All injections were done after obtaining appropriate EMG activity and after negative drawback for blood. The patient tolerated the procedure well. Post procedure instructions were given.   Made referral to PT, OT, SLP at neuro-rehab

## 2017-10-07 NOTE — Patient Instructions (Signed)
PLEASE FEEL FREE TO CALL OUR OFFICE WITH ANY PROBLEMS OR QUESTIONS ([phone])      

## 2017-10-17 ENCOUNTER — Encounter: Payer: Self-pay | Admitting: Physical Therapy

## 2017-10-17 ENCOUNTER — Ambulatory Visit: Payer: Medicare Other | Admitting: Occupational Therapy

## 2017-10-17 ENCOUNTER — Ambulatory Visit: Payer: Medicare Other | Attending: Physical Medicine & Rehabilitation | Admitting: Physical Therapy

## 2017-10-17 DIAGNOSIS — M6281 Muscle weakness (generalized): Secondary | ICD-10-CM | POA: Insufficient documentation

## 2017-10-17 DIAGNOSIS — G8250 Quadriplegia, unspecified: Secondary | ICD-10-CM | POA: Diagnosis not present

## 2017-10-17 DIAGNOSIS — M6249 Contracture of muscle, multiple sites: Secondary | ICD-10-CM | POA: Insufficient documentation

## 2017-10-17 DIAGNOSIS — G8929 Other chronic pain: Secondary | ICD-10-CM | POA: Diagnosis not present

## 2017-10-17 DIAGNOSIS — R29818 Other symptoms and signs involving the nervous system: Secondary | ICD-10-CM | POA: Insufficient documentation

## 2017-10-17 DIAGNOSIS — M25512 Pain in left shoulder: Secondary | ICD-10-CM | POA: Insufficient documentation

## 2017-10-17 DIAGNOSIS — R41844 Frontal lobe and executive function deficit: Secondary | ICD-10-CM

## 2017-10-17 DIAGNOSIS — M25622 Stiffness of left elbow, not elsewhere classified: Secondary | ICD-10-CM | POA: Diagnosis not present

## 2017-10-17 DIAGNOSIS — M25522 Pain in left elbow: Secondary | ICD-10-CM | POA: Insufficient documentation

## 2017-10-17 DIAGNOSIS — R293 Abnormal posture: Secondary | ICD-10-CM | POA: Diagnosis not present

## 2017-10-17 DIAGNOSIS — R29898 Other symptoms and signs involving the musculoskeletal system: Secondary | ICD-10-CM
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
## 2017-10-17 NOTE — Patient Instructions (Signed)
   Stretches to focus on:  1. Try to bend toes backwards.   2. Try to bend her ankle, holding onto her ankle and other hand along the bottom of her foot. Try to bring top of foot toward her shin.  3. Try to rotate legs inward so her knees are facing forward/up to the ceiling.   Encourage her to move anyway she can!!
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
## 2017-10-21 ENCOUNTER — Encounter (HOSPITAL_COMMUNITY): Payer: Self-pay | Admitting: Speech Pathology

## 2017-10-21 ENCOUNTER — Encounter (HOSPITAL_COMMUNITY): Payer: Self-pay | Admitting: Specialist

## 2017-10-21 ENCOUNTER — Ambulatory Visit (HOSPITAL_COMMUNITY): Payer: Medicare Other | Attending: Physical Medicine & Rehabilitation | Admitting: Physical Therapy

## 2017-10-21 ENCOUNTER — Other Ambulatory Visit: Payer: Self-pay

## 2017-10-21 ENCOUNTER — Ambulatory Visit (HOSPITAL_COMMUNITY): Payer: Medicare Other | Admitting: Specialist

## 2017-10-21 ENCOUNTER — Ambulatory Visit (HOSPITAL_COMMUNITY): Payer: Medicare Other | Admitting: Speech Pathology

## 2017-10-21 DIAGNOSIS — M6281 Muscle weakness (generalized): Secondary | ICD-10-CM | POA: Insufficient documentation

## 2017-10-21 DIAGNOSIS — G8250 Quadriplegia, unspecified: Secondary | ICD-10-CM | POA: Diagnosis not present

## 2017-10-21 DIAGNOSIS — R29898 Other symptoms and signs involving the musculoskeletal system: Secondary | ICD-10-CM

## 2017-10-21 DIAGNOSIS — S069X0S Unspecified intracranial injury without loss of consciousness, sequela: Secondary | ICD-10-CM | POA: Diagnosis not present

## 2017-10-21 DIAGNOSIS — R29818 Other symptoms and signs involving the nervous system: Secondary | ICD-10-CM | POA: Insufficient documentation

## 2017-10-21 DIAGNOSIS — R293 Abnormal posture: Secondary | ICD-10-CM

## 2017-10-21 DIAGNOSIS — F068 Other specified mental disorders due to known physiological condition: Secondary | ICD-10-CM | POA: Diagnosis not present

## 2017-10-21 DIAGNOSIS — X58XXXS Exposure to other specified factors, sequela: Secondary | ICD-10-CM | POA: Diagnosis not present

## 2017-10-21 DIAGNOSIS — M6249 Contracture of muscle, multiple sites: Secondary | ICD-10-CM | POA: Diagnosis not present

## 2017-10-21 DIAGNOSIS — M25522 Pain in left elbow: Secondary | ICD-10-CM | POA: Insufficient documentation

## 2017-10-21 DIAGNOSIS — M25622 Stiffness of left elbow, not elsewhere classified: Secondary | ICD-10-CM | POA: Insufficient documentation

## 2017-10-21 NOTE — Patient Instructions (Addendum)
Dorsiflexion: Stretch - Heel Cord / Gastrocnemius    Position (A) Patient: Lie or sit with knee straight. Helper: Cup left heel. Make sure grip is firm. Motion (B) - Helper uses forearm to apply pressure to entire sole of foot, stretching foot toward shin. CAUTION: Stretch should be felt in calf. Do not allow foot to twist. Hold __20_ seconds. Repeat _5__ times. Repeat with other leg. Do _1__ sessions per day. Variation: Contract method: Resist ___ seconds. (see card G.G.-14)   Copyright  VHI. All rights reserved.  Forefoot Supination / Pronation: ROM    Position Helper: Stabilize left foot between heel and arch with one hand. Grasp ball of foot with other hand. Motion -Turn ball of foot inward, increasing the height of the arch.(A) -Then turn ball of foot outward.(B) -Helper guides motion. Repeat _10__ times. Repeat with other leg. Do __1_ sessions per day. Variation: Patient is passive.  Copyright  VHI. All rights reserved.  Toe Extension: Stretch - Flexors    Position Helper: Hold left foot securely under heel. Motion - Helper presses toes gently toward shin. CAUTION: Do not allow foot to twist. Stop at point of tension in muscle or joint. Hold _20__ seconds. Repeat _10__ times. Repeat with other leg. Do _1__ sessions per day. Variation: Perform with ankle flexed. Gently press toes toward shin. Perform only on these toes: ______.  Copyright  VHI. All rights reserved.  Extension: AAROM (Supine)    Position (A) Patient: Bend left knee over large pillow. Helper: Stabilize thigh. Place other hand under heel. Motion ( -Helper provides support as needed to control movement. Repeat _10__ times. Repeat with other leg. Do _1__ sessions per day. Copyright  VHI. All rights reserved.  Flexion: ROM (Side-Lying)    Position (A) Patient: Lie with left side up and top leg straight. Small pillow may be placed between knees. Helper: Stabilize hip. Place other hand around ankle. Motion (B) -  Bend knee, bringing foot back toward buttock. - Do not allow hip to move or bend. Repeat _10__ times. Repeat with other leg. Do __1_ sessions per day. Variation: Patient is passive.  Copyright  VHI. All rights reserved.  Abduction: ROM (Supine)    Position (A) Helper: Support left leg, keeping knee straight. Stabilize other leg, if necessary, to prevent it from moving. Motion (B) - Glide leg out to side, keeping toes pointing toward ceiling. - Stop at point of tension in inner thigh. Repeat _10__ times. Repeat with other leg. Do __1_ sessions per day. Copyright  VHI. All rights reserved.  Extension: ROM (Side-Lying)    Position (A) Patient: Lie with left side up, knee bent to 90, hip straight. Helper: Stabilize hip. Other hand supports leg. Motion (B) - Glide leg backward. -Stop at point of tension in hip or front of thigh. CAUTION: Movement will be only a few inches. Do not allow motion at pelvis or low back. Repeat __5-10_ times. Repeat with other leg. Do _1__ sessions per day. V Copyright  VHI. All rights reserved.  External Rotation: ROM (Sitting)    Position (A) Helper: Support left knee, other hand at ankle. Lift leg. Motion (B) - Move foot across in front of opposite shin, keeping upper leg in place. CAUTION: Do not twist knee. Stop at point of tension. Repeat 10___ times. Repeat with other leg. Do __1_ sessions per day. Variation: Contract method:
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
## 2017-10-21 NOTE — Patient Instructions (Signed)
   Complete each exercise 10 times each 3 times per day.  AROM: Wrist Extension   With right palm down, bend wrist up. Repeat ____ times per set. Do ____ sets per session. Do ____ sessions per day.  Copyright  VHI. All rights reserved.   AROM: Wrist Flexion   With right palm up, bend wrist up. Repeat ____ times per set. Do ____ sets per session. Do ____ sessions per day.  Copyright  VHI. All rights reserved.   AROM: Forearm Pronation / Supination   With right arm in handshake position, slowly rotate palm down until stretch is felt. Relax. Then rotate palm up until stretch is felt. Repeat ____ times per set. Do ____ sets per session. Do ____ sessions per day.  Copyright  VHI. All rights reserved.   AROM: Elbow Flexion / Extension   With left hand palm up, gently bend elbow as far as possible. Then straighten arm as far as possible. Repeat ____ times per set. Do ____ sets per session. Do ____ sessions per day.  Copyright  VHI. All rights reserved.      Towel Roll Squeeze   With right forearm resting on surface, gently squeeze towel. Repeat ____ times per set. Do ____ sets per session. Do ____ sessions per day.  Copyright  VHI. All rights reserved.

## 2017-10-22 ENCOUNTER — Telehealth (HOSPITAL_COMMUNITY): Payer: Self-pay | Admitting: Physical Therapy

## 2017-10-22 NOTE — Telephone Encounter (Signed)
FAXED LETTER OF APPTMENTS TO [facility] AND CALLED BONNIE. NF 10/22/17

## 2017-10-23 ENCOUNTER — Ambulatory Visit (HOSPITAL_COMMUNITY): Payer: Medicare Other

## 2017-10-23 ENCOUNTER — Encounter (HOSPITAL_COMMUNITY): Payer: Self-pay

## 2017-10-23 ENCOUNTER — Other Ambulatory Visit: Payer: Self-pay

## 2017-10-23 ENCOUNTER — Ambulatory Visit (HOSPITAL_COMMUNITY): Payer: Medicare Other | Admitting: Physical Therapy

## 2017-10-23 DIAGNOSIS — R293 Abnormal posture: Secondary | ICD-10-CM

## 2017-10-23 DIAGNOSIS — G8250 Quadriplegia, unspecified: Secondary | ICD-10-CM

## 2017-10-23 DIAGNOSIS — R29898 Other symptoms and signs involving the musculoskeletal system: Secondary | ICD-10-CM

## 2017-10-23 DIAGNOSIS — M6281 Muscle weakness (generalized): Secondary | ICD-10-CM

## 2017-10-23 DIAGNOSIS — M6249 Contracture of muscle, multiple sites: Secondary | ICD-10-CM | POA: Diagnosis not present

## 2017-10-23 DIAGNOSIS — R29818 Other symptoms and signs involving the nervous system: Secondary | ICD-10-CM | POA: Diagnosis not present

## 2017-10-23 DIAGNOSIS — F068 Other specified mental disorders due to known physiological condition: Secondary | ICD-10-CM

## 2017-10-23 DIAGNOSIS — S069X0S Unspecified intracranial injury without loss of consciousness, sequela: Principal | ICD-10-CM

## 2017-10-23 NOTE — Patient Instructions (Addendum)
ROM: Plantar / Dorsiflexion    With right leg relaxed, gently flex and extend ankle. Move through full range of motion. Avoid pain.repeat to the left  Repeat 10____ times per set. Do ___1_ sets per session. Do ___2_ sessions per day.  http://orth.exer.us/34   Copyright  VHI. All rights reserved.  ROM: Inversion / Eversion    With right leg relaxed, gently turn ankle and foot in and out. Move through full range of motion. Avoid pain. Repeat __10__ times per set. Do ___1_ sets per session. Do __2__ sessions per day.  http://orth.exer.us/36   Copyright  VHI. All rights reserved.  Knee Extension (Sitting)    straighten  Both knee fully, lower slowly and pull back  Repeat 10____ times per set. Do _1___ sets per session. Do ___2_ sessions per day.  http://orth.exer.us/732   Copyright  VHI. All rights reserved.  Strengthening: Hip Adduction - Isometric    With ball or folded pillow between knees, squeeze knees together. Hold ___5_ seconds. Repeat _10___ times per set. Do 1____ sets per session. Do _2___ sessions per day.  http://orth.exer.us/612   Copyright  VHI. All rights reserved.  Isometric Gluteals    Tighten buttock muscles. Repeat _10___ times per set. Do __1__ sets per session. Do _2___ sessions per day.  http://orth.exer.us/1126   Copyright  VHI. All rights reserved.  Strengthening: Straight Leg Raise (Phase 2)    In your chair with parents supporting your leg keep your knee straight tighten muscles on front of right thigh, then lift leg __10__ inches from surface, keeping knee locked. Repeat to left but you will only be able to lift an inch or two.  Repeat 10____ times per set. Do ___1_ sets per session. Do _2___ sessions per day.  http://orth.exer.us/616   Copyright  VHI. All rights reserved.  Hip Abduction / Adduction: with Extended Knee (Supine)    In chair or bed with right leg supported by parent or caregiver slide leg out to side and  return. Keep knee straight.Repeat to the left although caregiver will have to help more with this one  Repeat _10___ times per set. Do 1____ sets per session. Do _2___ sessions per day.  http://orth.exer.us/680   Copyright  VHI. All rights reserved.  Adduction / Internal Rotation: ROM (Supine)    Position (A) Patient: Bend left leg, foot flat on bed. Helper: Stabilize hip. Place other hand on knee. Motion (B) -Push knee toward opposite side of body. -Stop at point of tension.  If In the chair have one hand on the outside of the thigh; the other hand on the inside of the lower leg.  Push in at the thigh and out at the leg.  Hold for 15 to 30 seconds  Repeat _5-10__ times. Repeat with other leg. Do _1-2__ sessions per day.   Copyright  VHI. All rights reserved.
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
## 2017-10-27 ENCOUNTER — Other Ambulatory Visit: Payer: Self-pay | Admitting: Physical Medicine & Rehabilitation

## 2017-10-28 ENCOUNTER — Ambulatory Visit (HOSPITAL_COMMUNITY): Payer: Medicare Other | Admitting: Speech Pathology

## 2017-10-28 ENCOUNTER — Ambulatory Visit (HOSPITAL_COMMUNITY): Payer: Medicare Other

## 2017-10-28 ENCOUNTER — Encounter (HOSPITAL_COMMUNITY): Payer: Self-pay

## 2017-10-28 ENCOUNTER — Other Ambulatory Visit: Payer: Self-pay

## 2017-10-28 ENCOUNTER — Encounter (HOSPITAL_COMMUNITY): Payer: Medicare Other

## 2017-10-28 ENCOUNTER — Ambulatory Visit (HOSPITAL_COMMUNITY): Payer: Medicare Other | Admitting: Physical Therapy

## 2017-10-28 ENCOUNTER — Encounter: Payer: Self-pay | Admitting: Physical Medicine & Rehabilitation

## 2017-10-28 ENCOUNTER — Other Ambulatory Visit: Payer: Self-pay | Admitting: *Deleted

## 2017-10-28 ENCOUNTER — Encounter (HOSPITAL_COMMUNITY): Payer: Self-pay | Admitting: Speech Pathology

## 2017-10-28 DIAGNOSIS — R293 Abnormal posture: Secondary | ICD-10-CM

## 2017-10-28 DIAGNOSIS — G8250 Quadriplegia, unspecified: Secondary | ICD-10-CM

## 2017-10-28 DIAGNOSIS — M25622 Stiffness of left elbow, not elsewhere classified: Secondary | ICD-10-CM

## 2017-10-28 DIAGNOSIS — M6281 Muscle weakness (generalized): Secondary | ICD-10-CM

## 2017-10-28 DIAGNOSIS — M6249 Contracture of muscle, multiple sites: Secondary | ICD-10-CM

## 2017-10-28 DIAGNOSIS — F068 Other specified mental disorders due to known physiological condition: Secondary | ICD-10-CM

## 2017-10-28 DIAGNOSIS — R29898 Other symptoms and signs involving the musculoskeletal system: Secondary | ICD-10-CM

## 2017-10-28 DIAGNOSIS — R29818 Other symptoms and signs involving the nervous system: Secondary | ICD-10-CM

## 2017-10-28 DIAGNOSIS — M25522 Pain in left elbow: Secondary | ICD-10-CM

## 2017-10-28 DIAGNOSIS — S069X0S Unspecified intracranial injury without loss of consciousness, sequela: Principal | ICD-10-CM

## 2017-10-28 MED ORDER — LORAZEPAM 0.5 MG PO TABS: ORAL_TABLET | ORAL | 0 refills | Status: DC
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
## 2017-11-03 ENCOUNTER — Telehealth (HOSPITAL_COMMUNITY): Payer: Self-pay | Admitting: Physical Medicine & Rehabilitation

## 2017-11-03 NOTE — Telephone Encounter (Signed)
[name] left a message stating they were moving patient back to rehab ctr in [place] and to cx all of her appts in our office    1126/18

## 2017-11-04 ENCOUNTER — Encounter: Payer: Self-pay | Admitting: Physical Therapy

## 2017-11-04 ENCOUNTER — Ambulatory Visit: Payer: Medicare Other | Admitting: Physical Therapy

## 2017-11-04 ENCOUNTER — Encounter: Payer: Self-pay | Admitting: Occupational Therapy

## 2017-11-04 ENCOUNTER — Ambulatory Visit: Payer: Medicare Other | Admitting: Occupational Therapy

## 2017-11-04 ENCOUNTER — Ambulatory Visit (HOSPITAL_COMMUNITY): Payer: Medicare Other | Admitting: Speech Pathology

## 2017-11-04 ENCOUNTER — Ambulatory Visit (HOSPITAL_COMMUNITY): Payer: Medicare Other

## 2017-11-04 DIAGNOSIS — M6249 Contracture of muscle, multiple sites: Secondary | ICD-10-CM

## 2017-11-04 DIAGNOSIS — M25622 Stiffness of left elbow, not elsewhere classified: Secondary | ICD-10-CM

## 2017-11-04 DIAGNOSIS — G8250 Quadriplegia, unspecified: Secondary | ICD-10-CM | POA: Diagnosis not present

## 2017-11-04 DIAGNOSIS — R29898 Other symptoms and signs involving the musculoskeletal system: Secondary | ICD-10-CM

## 2017-11-04 DIAGNOSIS — M6281 Muscle weakness (generalized): Secondary | ICD-10-CM

## 2017-11-04 DIAGNOSIS — R29818 Other symptoms and signs involving the nervous system: Secondary | ICD-10-CM

## 2017-11-04 DIAGNOSIS — R41844 Frontal lobe and executive function deficit: Secondary | ICD-10-CM

## 2017-11-04 DIAGNOSIS — M25512 Pain in left shoulder: Secondary | ICD-10-CM

## 2017-11-04 DIAGNOSIS — M25522 Pain in left elbow: Secondary | ICD-10-CM

## 2017-11-04 DIAGNOSIS — G8929 Other chronic pain: Secondary | ICD-10-CM

## 2017-11-04 DIAGNOSIS — R293 Abnormal posture: Secondary | ICD-10-CM | POA: Diagnosis not present
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
## 2017-11-06 ENCOUNTER — Ambulatory Visit (HOSPITAL_COMMUNITY): Payer: Medicare Other

## 2017-11-06 ENCOUNTER — Encounter (HOSPITAL_COMMUNITY): Payer: Medicare Other

## 2017-11-11 ENCOUNTER — Encounter (HOSPITAL_COMMUNITY): Payer: Medicare Other | Admitting: Speech Pathology

## 2017-11-11 ENCOUNTER — Ambulatory Visit (HOSPITAL_COMMUNITY): Payer: Medicare Other | Admitting: Physical Therapy

## 2017-11-11 ENCOUNTER — Encounter (HOSPITAL_COMMUNITY): Payer: Medicare Other | Admitting: Occupational Therapy

## 2017-11-13 ENCOUNTER — Ambulatory Visit: Payer: Medicare Other | Admitting: Occupational Therapy

## 2017-11-13 ENCOUNTER — Encounter (HOSPITAL_COMMUNITY): Payer: Medicare Other

## 2017-11-13 ENCOUNTER — Ambulatory Visit: Payer: Medicare Other | Admitting: Physical Therapy

## 2017-11-13 ENCOUNTER — Ambulatory Visit (HOSPITAL_COMMUNITY): Payer: Medicare Other | Admitting: Physical Therapy

## 2017-11-13 ENCOUNTER — Ambulatory Visit: Payer: Medicare Other | Admitting: Speech Pathology

## 2017-11-14 ENCOUNTER — Other Ambulatory Visit: Payer: Self-pay

## 2017-11-14 ENCOUNTER — Encounter: Payer: Self-pay | Admitting: Physical Therapy

## 2017-11-14 ENCOUNTER — Ambulatory Visit: Payer: Medicare Other | Attending: Physical Medicine & Rehabilitation

## 2017-11-14 ENCOUNTER — Ambulatory Visit: Payer: Medicare Other | Admitting: Physical Therapy

## 2017-11-14 DIAGNOSIS — M25522 Pain in left elbow: Secondary | ICD-10-CM | POA: Diagnosis not present

## 2017-11-14 DIAGNOSIS — X58XXXS Exposure to other specified factors, sequela: Secondary | ICD-10-CM | POA: Diagnosis not present

## 2017-11-14 DIAGNOSIS — M25622 Stiffness of left elbow, not elsewhere classified: Secondary | ICD-10-CM | POA: Insufficient documentation

## 2017-11-14 DIAGNOSIS — M6249 Contracture of muscle, multiple sites: Secondary | ICD-10-CM

## 2017-11-14 DIAGNOSIS — R293 Abnormal posture: Secondary | ICD-10-CM

## 2017-11-14 DIAGNOSIS — R29818 Other symptoms and signs involving the nervous system: Secondary | ICD-10-CM | POA: Diagnosis not present

## 2017-11-14 DIAGNOSIS — G8929 Other chronic pain: Secondary | ICD-10-CM | POA: Insufficient documentation

## 2017-11-14 DIAGNOSIS — M6281 Muscle weakness (generalized): Secondary | ICD-10-CM | POA: Insufficient documentation

## 2017-11-14 DIAGNOSIS — R29898 Other symptoms and signs involving the musculoskeletal system: Secondary | ICD-10-CM | POA: Insufficient documentation

## 2017-11-14 DIAGNOSIS — F068 Other specified mental disorders due to known physiological condition: Secondary | ICD-10-CM | POA: Insufficient documentation

## 2017-11-14 DIAGNOSIS — S069X0S Unspecified intracranial injury without loss of consciousness, sequela: Secondary | ICD-10-CM | POA: Diagnosis not present

## 2017-11-14 DIAGNOSIS — M25512 Pain in left shoulder: Secondary | ICD-10-CM | POA: Diagnosis not present

## 2017-11-14 NOTE — Patient Instructions (Signed)
Memory Compensation Strategies  1. Use "WARM" strategy. W= write it down A=  associate it R=  repeat it M=  make a mental picture  2. You can keep a Social worker. Use a 3-ring notebook with sections for the following:  calendar,"to do list"/reminders, a section to journal what you did each day, important names and phone numbers, medications, and doctors' names/phone numbers.   3. Use a calendar to write appointments down.  4. Write yourself a schedule for the day.  This can be placed on the calendar or in a separate section of the Memory Notebook.  Keeping a regular schedule can help memory.  5. Use medication organizer with sections for each day or morning/evening pills  You may need help loading it  6. Keep a basket, or pegboard by the door.   Place items that you need to take out with you in the basket or on the pegboard.  You may also want to include a message board for reminders.  7. Use sticky notes. Place sticky notes with reminders in a place where the task is performed.  For example:  "turn off the stove" placed by the stove, "lock the door" placed on the door at eye level, "take your medications" on the bathroom mirror or by the place where you normally take your medications  8. Use alarms/timers.  Use while cooking to remind yourself to check on food or as a reminder to take your medicine, or as a reminder to make a call, or as a reminder to perform another task, etc.  9. Use a small tape recorder to record important information and notes for yourself.
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
## 2017-11-17 ENCOUNTER — Ambulatory Visit: Payer: Medicare Other | Admitting: Speech Pathology

## 2017-11-17 ENCOUNTER — Ambulatory Visit: Payer: Medicare Other | Admitting: Occupational Therapy

## 2017-11-17 ENCOUNTER — Ambulatory Visit: Payer: Medicare Other | Admitting: Physical Therapy

## 2017-11-19 ENCOUNTER — Ambulatory Visit: Payer: Medicare Other

## 2017-11-19 ENCOUNTER — Ambulatory Visit: Payer: Medicare Other | Admitting: Occupational Therapy

## 2017-11-19 ENCOUNTER — Other Ambulatory Visit: Payer: Self-pay

## 2017-11-19 DIAGNOSIS — S069X0S Unspecified intracranial injury without loss of consciousness, sequela: Principal | ICD-10-CM

## 2017-11-19 DIAGNOSIS — G8929 Other chronic pain: Secondary | ICD-10-CM

## 2017-11-19 DIAGNOSIS — R29898 Other symptoms and signs involving the musculoskeletal system: Secondary | ICD-10-CM | POA: Diagnosis not present

## 2017-11-19 DIAGNOSIS — M6281 Muscle weakness (generalized): Secondary | ICD-10-CM

## 2017-11-19 DIAGNOSIS — M25512 Pain in left shoulder: Secondary | ICD-10-CM

## 2017-11-19 DIAGNOSIS — R29818 Other symptoms and signs involving the nervous system: Secondary | ICD-10-CM | POA: Diagnosis not present

## 2017-11-19 DIAGNOSIS — M6249 Contracture of muscle, multiple sites: Secondary | ICD-10-CM | POA: Diagnosis not present

## 2017-11-19 DIAGNOSIS — F068 Other specified mental disorders due to known physiological condition: Secondary | ICD-10-CM

## 2017-11-19 DIAGNOSIS — M25622 Stiffness of left elbow, not elsewhere classified: Secondary | ICD-10-CM

## 2017-11-19 DIAGNOSIS — M25522 Pain in left elbow: Secondary | ICD-10-CM

## 2017-11-19 NOTE — Patient Instructions (Signed)
  Please complete the assigned speech therapy homework prior to your next session and return it to the speech therapist at your next visit. Naming tasks (naming items, and naming categories) would be good tasks to complete at home to help with focus. Please bring in the memory journal next visit.

## 2017-11-19 NOTE — Patient Instructions (Signed)
Wear your bean bag splint for 15-30 mins 1- 2x day, when you remove splint check skin for pressure areas or redness, if any problems remove splint and bring it to next visit.
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
## 2017-11-24 ENCOUNTER — Encounter: Payer: Self-pay | Admitting: Occupational Therapy

## 2017-11-24 ENCOUNTER — Ambulatory Visit: Payer: Medicare Other | Admitting: Physical Therapy

## 2017-11-24 ENCOUNTER — Encounter: Payer: Self-pay | Admitting: Speech Pathology

## 2017-11-24 ENCOUNTER — Ambulatory Visit: Payer: Medicare Other | Admitting: Speech Pathology

## 2017-11-24 ENCOUNTER — Ambulatory Visit: Payer: Medicare Other | Admitting: Occupational Therapy

## 2017-11-24 ENCOUNTER — Other Ambulatory Visit: Payer: Self-pay | Admitting: Physical Medicine & Rehabilitation

## 2017-11-24 ENCOUNTER — Other Ambulatory Visit: Payer: Self-pay

## 2017-11-24 DIAGNOSIS — G8929 Other chronic pain: Secondary | ICD-10-CM

## 2017-11-24 DIAGNOSIS — F068 Other specified mental disorders due to known physiological condition: Secondary | ICD-10-CM | POA: Diagnosis not present

## 2017-11-24 DIAGNOSIS — M6281 Muscle weakness (generalized): Secondary | ICD-10-CM

## 2017-11-24 DIAGNOSIS — M6249 Contracture of muscle, multiple sites: Secondary | ICD-10-CM | POA: Diagnosis not present

## 2017-11-24 DIAGNOSIS — S069X0S Unspecified intracranial injury without loss of consciousness, sequela: Secondary | ICD-10-CM | POA: Diagnosis not present

## 2017-11-24 DIAGNOSIS — M25522 Pain in left elbow: Secondary | ICD-10-CM

## 2017-11-24 DIAGNOSIS — M25512 Pain in left shoulder: Secondary | ICD-10-CM

## 2017-11-24 DIAGNOSIS — M25622 Stiffness of left elbow, not elsewhere classified: Secondary | ICD-10-CM

## 2017-11-24 DIAGNOSIS — R29818 Other symptoms and signs involving the nervous system: Secondary | ICD-10-CM

## 2017-11-24 DIAGNOSIS — R29898 Other symptoms and signs involving the musculoskeletal system: Secondary | ICD-10-CM
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
## 2017-11-25 ENCOUNTER — Encounter: Payer: Medicare Other | Admitting: Physical Medicine & Rehabilitation

## 2017-11-26 ENCOUNTER — Ambulatory Visit: Payer: Medicare Other | Admitting: Physical Therapy

## 2017-11-26 ENCOUNTER — Other Ambulatory Visit: Payer: Self-pay

## 2017-11-26 ENCOUNTER — Encounter: Payer: Self-pay | Admitting: Physical Therapy

## 2017-11-26 ENCOUNTER — Ambulatory Visit: Payer: Medicare Other | Admitting: Occupational Therapy

## 2017-11-26 ENCOUNTER — Ambulatory Visit: Payer: Medicare Other | Admitting: Speech Pathology

## 2017-11-26 DIAGNOSIS — M6281 Muscle weakness (generalized): Secondary | ICD-10-CM

## 2017-11-26 DIAGNOSIS — R29898 Other symptoms and signs involving the musculoskeletal system: Secondary | ICD-10-CM

## 2017-11-26 DIAGNOSIS — F068 Other specified mental disorders due to known physiological condition: Secondary | ICD-10-CM | POA: Diagnosis not present

## 2017-11-26 DIAGNOSIS — M6249 Contracture of muscle, multiple sites: Secondary | ICD-10-CM

## 2017-11-26 DIAGNOSIS — M25512 Pain in left shoulder: Secondary | ICD-10-CM

## 2017-11-26 DIAGNOSIS — G8929 Other chronic pain: Secondary | ICD-10-CM

## 2017-11-26 DIAGNOSIS — M25622 Stiffness of left elbow, not elsewhere classified: Secondary | ICD-10-CM

## 2017-11-26 DIAGNOSIS — R29818 Other symptoms and signs involving the nervous system: Secondary | ICD-10-CM

## 2017-11-26 DIAGNOSIS — S069X0S Unspecified intracranial injury without loss of consciousness, sequela: Secondary | ICD-10-CM | POA: Diagnosis not present

## 2017-11-26 DIAGNOSIS — M25522 Pain in left elbow: Secondary | ICD-10-CM
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
## 2017-11-26 NOTE — Patient Instructions (Signed)
  [employer] Probation officer (no timer) Family Fued  Optometrist in your journal, using calendar, using memory strategies  Doristine Devoid job today!
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
## 2017-11-28 ENCOUNTER — Telehealth: Payer: Self-pay
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
## 2017-11-28 NOTE — Telephone Encounter (Signed)
Let me know if there's anything I can do.

## 2017-12-04 ENCOUNTER — Ambulatory Visit: Payer: Medicare Other | Admitting: Occupational Therapy

## 2017-12-04 ENCOUNTER — Ambulatory Visit: Payer: Medicare Other | Admitting: Physical Therapy

## 2017-12-04 ENCOUNTER — Encounter: Payer: Self-pay | Admitting: Occupational Therapy

## 2017-12-04 ENCOUNTER — Encounter: Payer: Medicare Other | Admitting: Speech Pathology

## 2017-12-04 DIAGNOSIS — M6281 Muscle weakness (generalized): Secondary | ICD-10-CM

## 2017-12-04 DIAGNOSIS — M25522 Pain in left elbow: Secondary | ICD-10-CM

## 2017-12-04 DIAGNOSIS — M6249 Contracture of muscle, multiple sites: Secondary | ICD-10-CM | POA: Diagnosis not present

## 2017-12-04 DIAGNOSIS — M25512 Pain in left shoulder: Secondary | ICD-10-CM

## 2017-12-04 DIAGNOSIS — R29898 Other symptoms and signs involving the musculoskeletal system: Secondary | ICD-10-CM

## 2017-12-04 DIAGNOSIS — R29818 Other symptoms and signs involving the nervous system: Secondary | ICD-10-CM

## 2017-12-04 DIAGNOSIS — M25622 Stiffness of left elbow, not elsewhere classified: Secondary | ICD-10-CM

## 2017-12-04 DIAGNOSIS — G8929 Other chronic pain: Secondary | ICD-10-CM

## 2017-12-04 DIAGNOSIS — S069X0S Unspecified intracranial injury without loss of consciousness, sequela: Secondary | ICD-10-CM | POA: Diagnosis not present

## 2017-12-04 DIAGNOSIS — F068 Other specified mental disorders due to known physiological condition: Secondary | ICD-10-CM | POA: Diagnosis not present
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
## 2017-12-05 ENCOUNTER — Ambulatory Visit: Payer: Medicare Other | Admitting: Physical Therapy

## 2017-12-05 ENCOUNTER — Encounter: Payer: Self-pay | Admitting: Physical Therapy

## 2017-12-05 ENCOUNTER — Ambulatory Visit: Payer: Medicare Other | Admitting: Occupational Therapy

## 2017-12-05 DIAGNOSIS — M6281 Muscle weakness (generalized): Secondary | ICD-10-CM

## 2017-12-05 DIAGNOSIS — F068 Other specified mental disorders due to known physiological condition: Secondary | ICD-10-CM | POA: Diagnosis not present

## 2017-12-05 DIAGNOSIS — R29898 Other symptoms and signs involving the musculoskeletal system: Secondary | ICD-10-CM

## 2017-12-05 DIAGNOSIS — M25522 Pain in left elbow: Secondary | ICD-10-CM

## 2017-12-05 DIAGNOSIS — R29818 Other symptoms and signs involving the nervous system: Secondary | ICD-10-CM

## 2017-12-05 DIAGNOSIS — M25622 Stiffness of left elbow, not elsewhere classified: Secondary | ICD-10-CM

## 2017-12-05 DIAGNOSIS — M6249 Contracture of muscle, multiple sites: Secondary | ICD-10-CM | POA: Diagnosis not present

## 2017-12-05 DIAGNOSIS — S069X0S Unspecified intracranial injury without loss of consciousness, sequela: Secondary | ICD-10-CM | POA: Diagnosis not present
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
## 2017-12-05 NOTE — Therapy (Deleted)
[facility] [address] [place], [place], [address] Phone: [phone]   Fax:  [phone]  Patient Details  Name: [name] MRN: [id number] Date of Birth: [birth year] Referring Provider:  Meredith Staggers, MD  Encounter Date: 12/05/2017   Theone Murdoch 12/05/2017, 3:34 PM  [facility] [address] [place], [place], [address] Phone: [phone]   Fax:  [phone]
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
## 2017-12-11 ENCOUNTER — Encounter: Payer: Self-pay | Admitting: Physical Medicine & Rehabilitation

## 2017-12-17 ENCOUNTER — Encounter: Payer: Self-pay | Admitting: Physical Medicine & Rehabilitation

## 2017-12-17 ENCOUNTER — Telehealth: Payer: Self-pay | Admitting: Physical Medicine & Rehabilitation

## 2017-12-17 NOTE — Telephone Encounter (Signed)
SPOKE WITH [name] SOC WORK WITH [facility] [place] COUNT DSS FL2 NEEDS TO BE COMPLETED PTN IS BEING MOVED OUT OF HOME- NEED MD DOCUMENTATION OF PATIENT NEEDS SCANNED FL2 AND [employer] DOCUMENTS TO FILE AFTER COMPLETED BY [facility]

## 2017-12-23 ENCOUNTER — Encounter: Payer: Self-pay | Admitting: Physical Medicine & Rehabilitation

## 2017-12-24 ENCOUNTER — Other Ambulatory Visit: Payer: Self-pay | Admitting: Physical Medicine & Rehabilitation

## 2017-12-24 NOTE — Telephone Encounter (Signed)
Recieved electronic medication refill request for propranolol, no mention in any notes as to use or continue this medication, please advise.

## 2017-12-25 ENCOUNTER — Other Ambulatory Visit: Payer: Self-pay | Admitting: *Deleted

## 2017-12-25 MED ORDER — TRAMADOL HCL 50 MG PO TABS: ORAL_TABLET | ORAL | 0 refills | Status: DC

## 2017-12-25 MED ORDER — LORAZEPAM 0.5 MG PO TABS: ORAL_TABLET | ORAL | 0 refills | Status: DC

## 2017-12-29 ENCOUNTER — Encounter: Payer: Self-pay | Admitting: Physical Medicine & Rehabilitation

## 2017-12-29 ENCOUNTER — Encounter: Payer: Medicare Other | Attending: Physical Medicine & Rehabilitation | Admitting: Physical Medicine & Rehabilitation

## 2017-12-29 ENCOUNTER — Other Ambulatory Visit: Payer: Self-pay

## 2017-12-29 VITALS — BP 113/76 | HR 68

## 2017-12-29 DIAGNOSIS — Z87891 Personal history of nicotine dependence: Secondary | ICD-10-CM | POA: Insufficient documentation

## 2017-12-29 DIAGNOSIS — Z87820 Personal history of traumatic brain injury: Secondary | ICD-10-CM

## 2017-12-29 DIAGNOSIS — Z90710 Acquired absence of both cervix and uterus: Secondary | ICD-10-CM | POA: Insufficient documentation

## 2017-12-29 DIAGNOSIS — B373 Candidiasis of vulva and vagina: Secondary | ICD-10-CM | POA: Insufficient documentation

## 2017-12-29 DIAGNOSIS — K5900 Constipation, unspecified: Secondary | ICD-10-CM | POA: Diagnosis not present

## 2017-12-29 DIAGNOSIS — G8250 Quadriplegia, unspecified: Secondary | ICD-10-CM
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
## 2017-12-29 NOTE — Patient Instructions (Signed)
CALL ME ABOUT THERAPY WHEN YOU'RE READY!!!   PLEASE FEEL FREE TO CALL OUR OFFICE WITH ANY PROBLEMS OR QUESTIONS ([phone])

## 2018-01-09 DIAGNOSIS — G8250 Quadriplegia, unspecified: Secondary | ICD-10-CM | POA: Diagnosis not present

## 2018-01-09 DIAGNOSIS — R41841 Cognitive communication deficit: Secondary | ICD-10-CM | POA: Diagnosis not present

## 2018-01-09 DIAGNOSIS — R293 Abnormal posture: Secondary | ICD-10-CM | POA: Diagnosis not present

## 2018-01-09 DIAGNOSIS — S062X9D Diffuse traumatic brain injury with loss of consciousness of unspecified duration, subsequent encounter: Secondary | ICD-10-CM | POA: Diagnosis not present

## 2018-01-10 DIAGNOSIS — R293 Abnormal posture: Secondary | ICD-10-CM | POA: Diagnosis not present

## 2018-01-10 DIAGNOSIS — G8250 Quadriplegia, unspecified: Secondary | ICD-10-CM | POA: Diagnosis not present

## 2018-01-10 DIAGNOSIS — S062X9D Diffuse traumatic brain injury with loss of consciousness of unspecified duration, subsequent encounter: Secondary | ICD-10-CM | POA: Diagnosis not present

## 2018-01-10 DIAGNOSIS — R41841 Cognitive communication deficit: Secondary | ICD-10-CM | POA: Diagnosis not present

## 2018-01-12 DIAGNOSIS — R41841 Cognitive communication deficit: Secondary | ICD-10-CM | POA: Diagnosis not present

## 2018-01-12 DIAGNOSIS — S062X9D Diffuse traumatic brain injury with loss of consciousness of unspecified duration, subsequent encounter: Secondary | ICD-10-CM | POA: Diagnosis not present

## 2018-01-12 DIAGNOSIS — R293 Abnormal posture: Secondary | ICD-10-CM | POA: Diagnosis not present

## 2018-01-12 DIAGNOSIS — G8250 Quadriplegia, unspecified: Secondary | ICD-10-CM | POA: Diagnosis not present

## 2018-01-13 DIAGNOSIS — G8250 Quadriplegia, unspecified: Secondary | ICD-10-CM | POA: Diagnosis not present

## 2018-01-13 DIAGNOSIS — R293 Abnormal posture: Secondary | ICD-10-CM | POA: Diagnosis not present

## 2018-01-13 DIAGNOSIS — R1310 Dysphagia, unspecified: Secondary | ICD-10-CM | POA: Diagnosis not present

## 2018-01-13 DIAGNOSIS — R41841 Cognitive communication deficit: Secondary | ICD-10-CM | POA: Diagnosis not present

## 2018-01-13 DIAGNOSIS — M6281 Muscle weakness (generalized): Secondary | ICD-10-CM | POA: Diagnosis not present

## 2018-01-13 DIAGNOSIS — S062X9D Diffuse traumatic brain injury with loss of consciousness of unspecified duration, subsequent encounter: Secondary | ICD-10-CM | POA: Diagnosis not present

## 2018-01-13 DIAGNOSIS — M62838 Other muscle spasm: Secondary | ICD-10-CM | POA: Diagnosis not present

## 2018-01-14 ENCOUNTER — Other Ambulatory Visit: Payer: Self-pay | Admitting: Physical Medicine & Rehabilitation

## 2018-01-14 DIAGNOSIS — M6281 Muscle weakness (generalized): Secondary | ICD-10-CM | POA: Diagnosis not present

## 2018-01-14 DIAGNOSIS — G8250 Quadriplegia, unspecified: Secondary | ICD-10-CM | POA: Diagnosis not present

## 2018-01-14 DIAGNOSIS — F419 Anxiety disorder, unspecified: Secondary | ICD-10-CM | POA: Diagnosis not present

## 2018-01-14 DIAGNOSIS — S062X9D Diffuse traumatic brain injury with loss of consciousness of unspecified duration, subsequent encounter: Secondary | ICD-10-CM | POA: Diagnosis not present

## 2018-01-14 DIAGNOSIS — M62838 Other muscle spasm: Secondary | ICD-10-CM | POA: Diagnosis not present

## 2018-01-14 DIAGNOSIS — R293 Abnormal posture: Secondary | ICD-10-CM | POA: Diagnosis not present

## 2018-01-14 DIAGNOSIS — R41841 Cognitive communication deficit: Secondary | ICD-10-CM | POA: Diagnosis not present

## 2018-01-14 MED ORDER — PROPRANOLOL HCL 40 MG PO TABS: 40.0000 mg | ORAL_TABLET | Freq: Three times a day (TID) | ORAL | 6 refills | Status: DC

## 2018-01-15 DIAGNOSIS — S062X9D Diffuse traumatic brain injury with loss of consciousness of unspecified duration, subsequent encounter: Secondary | ICD-10-CM | POA: Diagnosis not present

## 2018-01-15 DIAGNOSIS — R41841 Cognitive communication deficit: Secondary | ICD-10-CM | POA: Diagnosis not present

## 2018-01-15 DIAGNOSIS — G8250 Quadriplegia, unspecified: Secondary | ICD-10-CM | POA: Diagnosis not present

## 2018-01-15 DIAGNOSIS — R293 Abnormal posture: Secondary | ICD-10-CM | POA: Diagnosis not present

## 2018-01-16 DIAGNOSIS — S062X9D Diffuse traumatic brain injury with loss of consciousness of unspecified duration, subsequent encounter: Secondary | ICD-10-CM | POA: Diagnosis not present

## 2018-01-16 DIAGNOSIS — R41841 Cognitive communication deficit: Secondary | ICD-10-CM | POA: Diagnosis not present

## 2018-01-16 DIAGNOSIS — G8250 Quadriplegia, unspecified: Secondary | ICD-10-CM | POA: Diagnosis not present

## 2018-01-16 DIAGNOSIS — R293 Abnormal posture: Secondary | ICD-10-CM | POA: Diagnosis not present

## 2018-01-19 DIAGNOSIS — Z79899 Other long term (current) drug therapy: Secondary | ICD-10-CM | POA: Diagnosis not present

## 2018-01-19 DIAGNOSIS — G8250 Quadriplegia, unspecified: Secondary | ICD-10-CM | POA: Diagnosis not present

## 2018-01-19 DIAGNOSIS — D518 Other vitamin B12 deficiency anemias: Secondary | ICD-10-CM | POA: Diagnosis not present

## 2018-01-19 DIAGNOSIS — R293 Abnormal posture: Secondary | ICD-10-CM | POA: Diagnosis not present

## 2018-01-19 DIAGNOSIS — R41841 Cognitive communication deficit: Secondary | ICD-10-CM | POA: Diagnosis not present

## 2018-01-19 DIAGNOSIS — E559 Vitamin D deficiency, unspecified: Secondary | ICD-10-CM | POA: Diagnosis not present

## 2018-01-19 DIAGNOSIS — E119 Type 2 diabetes mellitus without complications: Secondary | ICD-10-CM | POA: Diagnosis not present

## 2018-01-19 DIAGNOSIS — S062X9D Diffuse traumatic brain injury with loss of consciousness of unspecified duration, subsequent encounter: Secondary | ICD-10-CM | POA: Diagnosis not present

## 2018-01-19 DIAGNOSIS — E782 Mixed hyperlipidemia: Secondary | ICD-10-CM | POA: Diagnosis not present

## 2018-01-19 DIAGNOSIS — D649 Anemia, unspecified: Secondary | ICD-10-CM | POA: Diagnosis not present

## 2018-01-19 DIAGNOSIS — D688 Other specified coagulation defects: Secondary | ICD-10-CM | POA: Diagnosis not present

## 2018-01-19 DIAGNOSIS — E7800 Pure hypercholesterolemia, unspecified: Secondary | ICD-10-CM | POA: Diagnosis not present

## 2018-01-19 DIAGNOSIS — E039 Hypothyroidism, unspecified: Secondary | ICD-10-CM | POA: Diagnosis not present

## 2018-01-20 DIAGNOSIS — S062X9D Diffuse traumatic brain injury with loss of consciousness of unspecified duration, subsequent encounter: Secondary | ICD-10-CM | POA: Diagnosis not present

## 2018-01-20 DIAGNOSIS — R41841 Cognitive communication deficit: Secondary | ICD-10-CM | POA: Diagnosis not present

## 2018-01-20 DIAGNOSIS — G8250 Quadriplegia, unspecified: Secondary | ICD-10-CM | POA: Diagnosis not present

## 2018-01-20 DIAGNOSIS — R293 Abnormal posture: Secondary | ICD-10-CM | POA: Diagnosis not present

## 2018-01-21 DIAGNOSIS — S062X9D Diffuse traumatic brain injury with loss of consciousness of unspecified duration, subsequent encounter: Secondary | ICD-10-CM | POA: Diagnosis not present

## 2018-01-21 DIAGNOSIS — R41841 Cognitive communication deficit: Secondary | ICD-10-CM | POA: Diagnosis not present

## 2018-01-21 DIAGNOSIS — R293 Abnormal posture: Secondary | ICD-10-CM | POA: Diagnosis not present

## 2018-01-21 DIAGNOSIS — G8250 Quadriplegia, unspecified: Secondary | ICD-10-CM | POA: Diagnosis not present

## 2018-01-22 DIAGNOSIS — R41841 Cognitive communication deficit: Secondary | ICD-10-CM | POA: Diagnosis not present

## 2018-01-22 DIAGNOSIS — S062X9D Diffuse traumatic brain injury with loss of consciousness of unspecified duration, subsequent encounter: Secondary | ICD-10-CM | POA: Diagnosis not present

## 2018-01-22 DIAGNOSIS — G8250 Quadriplegia, unspecified: Secondary | ICD-10-CM | POA: Diagnosis not present

## 2018-01-22 DIAGNOSIS — R293 Abnormal posture: Secondary | ICD-10-CM | POA: Diagnosis not present

## 2018-01-23 DIAGNOSIS — R293 Abnormal posture: Secondary | ICD-10-CM | POA: Diagnosis not present

## 2018-01-23 DIAGNOSIS — F17211 Nicotine dependence, cigarettes, in remission: Secondary | ICD-10-CM | POA: Diagnosis not present

## 2018-01-23 DIAGNOSIS — F0632 Mood disorder due to known physiological condition with major depressive-like episode: Secondary | ICD-10-CM | POA: Diagnosis not present

## 2018-01-23 DIAGNOSIS — G8250 Quadriplegia, unspecified: Secondary | ICD-10-CM | POA: Diagnosis not present

## 2018-01-23 DIAGNOSIS — S062X9S Diffuse traumatic brain injury with loss of consciousness of unspecified duration, sequela: Secondary | ICD-10-CM | POA: Diagnosis not present

## 2018-01-23 DIAGNOSIS — R41841 Cognitive communication deficit: Secondary | ICD-10-CM | POA: Diagnosis not present

## 2018-01-23 DIAGNOSIS — S062X9D Diffuse traumatic brain injury with loss of consciousness of unspecified duration, subsequent encounter: Secondary | ICD-10-CM | POA: Diagnosis not present

## 2018-01-24 DIAGNOSIS — R293 Abnormal posture: Secondary | ICD-10-CM | POA: Diagnosis not present

## 2018-01-24 DIAGNOSIS — S062X9D Diffuse traumatic brain injury with loss of consciousness of unspecified duration, subsequent encounter: Secondary | ICD-10-CM | POA: Diagnosis not present

## 2018-01-24 DIAGNOSIS — R41841 Cognitive communication deficit: Secondary | ICD-10-CM | POA: Diagnosis not present

## 2018-01-24 DIAGNOSIS — G8250 Quadriplegia, unspecified: Secondary | ICD-10-CM | POA: Diagnosis not present

## 2018-01-26 DIAGNOSIS — R41841 Cognitive communication deficit: Secondary | ICD-10-CM | POA: Diagnosis not present

## 2018-01-26 DIAGNOSIS — F411 Generalized anxiety disorder: Secondary | ICD-10-CM | POA: Diagnosis not present

## 2018-01-26 DIAGNOSIS — R293 Abnormal posture: Secondary | ICD-10-CM | POA: Diagnosis not present

## 2018-01-26 DIAGNOSIS — F321 Major depressive disorder, single episode, moderate: Secondary | ICD-10-CM | POA: Diagnosis not present

## 2018-01-26 DIAGNOSIS — S062X9D Diffuse traumatic brain injury with loss of consciousness of unspecified duration, subsequent encounter: Secondary | ICD-10-CM | POA: Diagnosis not present

## 2018-01-26 DIAGNOSIS — G8250 Quadriplegia, unspecified: Secondary | ICD-10-CM | POA: Diagnosis not present

## 2018-01-27 DIAGNOSIS — G8250 Quadriplegia, unspecified: Secondary | ICD-10-CM | POA: Diagnosis not present

## 2018-01-27 DIAGNOSIS — R41841 Cognitive communication deficit: Secondary | ICD-10-CM | POA: Diagnosis not present

## 2018-01-27 DIAGNOSIS — R293 Abnormal posture: Secondary | ICD-10-CM | POA: Diagnosis not present

## 2018-01-27 DIAGNOSIS — S062X9D Diffuse traumatic brain injury with loss of consciousness of unspecified duration, subsequent encounter: Secondary | ICD-10-CM | POA: Diagnosis not present

## 2018-01-28 DIAGNOSIS — Z79899 Other long term (current) drug therapy: Secondary | ICD-10-CM | POA: Diagnosis not present

## 2018-01-28 DIAGNOSIS — S062X9D Diffuse traumatic brain injury with loss of consciousness of unspecified duration, subsequent encounter: Secondary | ICD-10-CM | POA: Diagnosis not present

## 2018-01-28 DIAGNOSIS — G8250 Quadriplegia, unspecified: Secondary | ICD-10-CM | POA: Diagnosis not present

## 2018-01-28 DIAGNOSIS — R293 Abnormal posture: Secondary | ICD-10-CM | POA: Diagnosis not present

## 2018-01-28 DIAGNOSIS — R41841 Cognitive communication deficit: Secondary | ICD-10-CM | POA: Diagnosis not present

## 2018-01-29 DIAGNOSIS — G8250 Quadriplegia, unspecified: Secondary | ICD-10-CM | POA: Diagnosis not present

## 2018-01-29 DIAGNOSIS — R293 Abnormal posture: Secondary | ICD-10-CM | POA: Diagnosis not present

## 2018-01-29 DIAGNOSIS — R41841 Cognitive communication deficit: Secondary | ICD-10-CM | POA: Diagnosis not present

## 2018-01-29 DIAGNOSIS — S062X9D Diffuse traumatic brain injury with loss of consciousness of unspecified duration, subsequent encounter: Secondary | ICD-10-CM | POA: Diagnosis not present

## 2018-01-30 DIAGNOSIS — R293 Abnormal posture: Secondary | ICD-10-CM | POA: Diagnosis not present

## 2018-01-30 DIAGNOSIS — G8250 Quadriplegia, unspecified: Secondary | ICD-10-CM | POA: Diagnosis not present

## 2018-01-30 DIAGNOSIS — S062X9D Diffuse traumatic brain injury with loss of consciousness of unspecified duration, subsequent encounter: Secondary | ICD-10-CM | POA: Diagnosis not present

## 2018-01-30 DIAGNOSIS — R41841 Cognitive communication deficit: Secondary | ICD-10-CM | POA: Diagnosis not present

## 2018-02-01 DIAGNOSIS — R293 Abnormal posture: Secondary | ICD-10-CM | POA: Diagnosis not present

## 2018-02-01 DIAGNOSIS — R41841 Cognitive communication deficit: Secondary | ICD-10-CM | POA: Diagnosis not present

## 2018-02-01 DIAGNOSIS — G8250 Quadriplegia, unspecified: Secondary | ICD-10-CM | POA: Diagnosis not present

## 2018-02-01 DIAGNOSIS — S062X9D Diffuse traumatic brain injury with loss of consciousness of unspecified duration, subsequent encounter: Secondary | ICD-10-CM | POA: Diagnosis not present

## 2018-02-02 DIAGNOSIS — R41841 Cognitive communication deficit: Secondary | ICD-10-CM | POA: Diagnosis not present

## 2018-02-02 DIAGNOSIS — S062X9D Diffuse traumatic brain injury with loss of consciousness of unspecified duration, subsequent encounter: Secondary | ICD-10-CM | POA: Diagnosis not present

## 2018-02-02 DIAGNOSIS — G8250 Quadriplegia, unspecified: Secondary | ICD-10-CM | POA: Diagnosis not present

## 2018-02-02 DIAGNOSIS — R293 Abnormal posture: Secondary | ICD-10-CM | POA: Diagnosis not present

## 2018-02-03 DIAGNOSIS — M62838 Other muscle spasm: Secondary | ICD-10-CM | POA: Diagnosis not present

## 2018-02-03 DIAGNOSIS — I10 Essential (primary) hypertension: Secondary | ICD-10-CM | POA: Diagnosis not present

## 2018-02-03 DIAGNOSIS — G8250 Quadriplegia, unspecified: Secondary | ICD-10-CM | POA: Diagnosis not present

## 2018-02-03 DIAGNOSIS — M6281 Muscle weakness (generalized): Secondary | ICD-10-CM | POA: Diagnosis not present

## 2018-02-04 DIAGNOSIS — G8250 Quadriplegia, unspecified: Secondary | ICD-10-CM | POA: Diagnosis not present

## 2018-02-04 DIAGNOSIS — R41841 Cognitive communication deficit: Secondary | ICD-10-CM | POA: Diagnosis not present

## 2018-02-04 DIAGNOSIS — R293 Abnormal posture: Secondary | ICD-10-CM | POA: Diagnosis not present

## 2018-02-04 DIAGNOSIS — S062X9D Diffuse traumatic brain injury with loss of consciousness of unspecified duration, subsequent encounter: Secondary | ICD-10-CM | POA: Diagnosis not present

## 2018-02-05 DIAGNOSIS — R293 Abnormal posture: Secondary | ICD-10-CM | POA: Diagnosis not present

## 2018-02-05 DIAGNOSIS — G8250 Quadriplegia, unspecified: Secondary | ICD-10-CM | POA: Diagnosis not present

## 2018-02-05 DIAGNOSIS — R41841 Cognitive communication deficit: Secondary | ICD-10-CM | POA: Diagnosis not present

## 2018-02-05 DIAGNOSIS — S062X9D Diffuse traumatic brain injury with loss of consciousness of unspecified duration, subsequent encounter: Secondary | ICD-10-CM | POA: Diagnosis not present

## 2018-02-06 DIAGNOSIS — R293 Abnormal posture: Secondary | ICD-10-CM | POA: Diagnosis not present

## 2018-02-06 DIAGNOSIS — S062X9D Diffuse traumatic brain injury with loss of consciousness of unspecified duration, subsequent encounter: Secondary | ICD-10-CM | POA: Diagnosis not present

## 2018-02-07 DIAGNOSIS — Z79899 Other long term (current) drug therapy: Secondary | ICD-10-CM | POA: Diagnosis not present

## 2018-02-07 DIAGNOSIS — D649 Anemia, unspecified: Secondary | ICD-10-CM | POA: Diagnosis not present

## 2018-02-09 DIAGNOSIS — R293 Abnormal posture: Secondary | ICD-10-CM | POA: Diagnosis not present

## 2018-02-09 DIAGNOSIS — S062X9D Diffuse traumatic brain injury with loss of consciousness of unspecified duration, subsequent encounter: Secondary | ICD-10-CM | POA: Diagnosis not present

## 2018-02-10 DIAGNOSIS — S062X9D Diffuse traumatic brain injury with loss of consciousness of unspecified duration, subsequent encounter: Secondary | ICD-10-CM | POA: Diagnosis not present

## 2018-02-10 DIAGNOSIS — R293 Abnormal posture: Secondary | ICD-10-CM | POA: Diagnosis not present

## 2018-02-11 DIAGNOSIS — S062X9D Diffuse traumatic brain injury with loss of consciousness of unspecified duration, subsequent encounter: Secondary | ICD-10-CM | POA: Diagnosis not present

## 2018-02-11 DIAGNOSIS — R293 Abnormal posture: Secondary | ICD-10-CM | POA: Diagnosis not present

## 2018-02-12 DIAGNOSIS — R293 Abnormal posture: Secondary | ICD-10-CM | POA: Diagnosis not present

## 2018-02-12 DIAGNOSIS — S062X9D Diffuse traumatic brain injury with loss of consciousness of unspecified duration, subsequent encounter: Secondary | ICD-10-CM | POA: Diagnosis not present

## 2018-02-13 DIAGNOSIS — G8250 Quadriplegia, unspecified: Secondary | ICD-10-CM | POA: Diagnosis not present

## 2018-02-13 DIAGNOSIS — R293 Abnormal posture: Secondary | ICD-10-CM | POA: Diagnosis not present

## 2018-02-13 DIAGNOSIS — M6281 Muscle weakness (generalized): Secondary | ICD-10-CM | POA: Diagnosis not present

## 2018-02-13 DIAGNOSIS — I10 Essential (primary) hypertension: Secondary | ICD-10-CM | POA: Diagnosis not present

## 2018-02-13 DIAGNOSIS — D649 Anemia, unspecified: Secondary | ICD-10-CM | POA: Diagnosis not present

## 2018-02-13 DIAGNOSIS — S062X9D Diffuse traumatic brain injury with loss of consciousness of unspecified duration, subsequent encounter: Secondary | ICD-10-CM | POA: Diagnosis not present

## 2018-02-13 DIAGNOSIS — M62838 Other muscle spasm: Secondary | ICD-10-CM | POA: Diagnosis not present

## 2018-02-16 DIAGNOSIS — S062X9D Diffuse traumatic brain injury with loss of consciousness of unspecified duration, subsequent encounter: Secondary | ICD-10-CM | POA: Diagnosis not present

## 2018-02-16 DIAGNOSIS — R293 Abnormal posture: Secondary | ICD-10-CM | POA: Diagnosis not present

## 2018-02-17 DIAGNOSIS — S062X9D Diffuse traumatic brain injury with loss of consciousness of unspecified duration, subsequent encounter: Secondary | ICD-10-CM | POA: Diagnosis not present

## 2018-02-17 DIAGNOSIS — R293 Abnormal posture: Secondary | ICD-10-CM | POA: Diagnosis not present

## 2018-02-25 ENCOUNTER — Encounter: Payer: Self-pay | Admitting: Physical Medicine & Rehabilitation

## 2018-02-25 ENCOUNTER — Encounter: Payer: Medicare Other | Attending: Physical Medicine & Rehabilitation | Admitting: Physical Medicine & Rehabilitation

## 2018-02-25 VITALS — BP 108/71 | HR 62

## 2018-02-25 DIAGNOSIS — B373 Candidiasis of vulva and vagina: Secondary | ICD-10-CM | POA: Diagnosis not present

## 2018-02-25 DIAGNOSIS — Z87891 Personal history of nicotine dependence: Secondary | ICD-10-CM | POA: Diagnosis not present

## 2018-02-25 DIAGNOSIS — K5900 Constipation, unspecified: Secondary | ICD-10-CM | POA: Insufficient documentation

## 2018-02-25 DIAGNOSIS — Z90710 Acquired absence of both cervix and uterus: Secondary | ICD-10-CM | POA: Insufficient documentation

## 2018-02-25 DIAGNOSIS — G8250 Quadriplegia, unspecified: Secondary | ICD-10-CM | POA: Diagnosis not present

## 2018-02-25 DIAGNOSIS — S069X0S Unspecified intracranial injury without loss of consciousness, sequela: Secondary | ICD-10-CM

## 2018-02-25 DIAGNOSIS — F068 Other specified mental disorders due to known physiological condition: Secondary | ICD-10-CM

## 2018-02-25 NOTE — Patient Instructions (Signed)
PLEASE FEEL FREE TO CALL OUR OFFICE WITH ANY PROBLEMS OR QUESTIONS ([phone])      

## 2018-02-25 NOTE — Progress Notes (Signed)
Subjective:    Patient ID: [name], female    DOB: 27-Sep-1981, 37 y.o.   MRN: [id number]  HPI   [name] is still at [facility] per direction of the courts. Mom states that they changed her medications including those for "sleep" and spasticity .  It sounds as if she went through some minor withdrawals.  I am not exactly sure what and how much medication she is on currently.  Apparently the hope is for Journi to return home over the next month or 2.  Mom states that at the nursing home they are doing some basic range of motion with her, perhaps once a week but that she is gone backwards from a standpoint of her range of motion.  She notes that her right leg is more "frog legged".  She is also concerned about a mole which is becoming larger and increasingly discolored along her left leg.  She may have lost some weight but it may only be around 5 pounds or so.    Pain Inventory Average Pain 3 Pain Right Now 0 My pain is dull and aching  In the last 24 hours, has pain interfered with the following? General activity 3 Relation with others 1 Enjoyment of life 1 What TIME of day is your pain at its worst? evening Sleep (in general) Poor  Pain is worse with: inactivity Pain improves with: therapy/exercise and medication Relief from Meds: 6  Mobility use a wheelchair needs help with transfers  Function disabled: date disabled 2015 I need assistance with the following:  dressing, bathing, toileting, meal prep, household duties and shopping  Neuro/Psych confusion depression anxiety  Prior Studies Any changes since last visit?  no  Physicians involved in your care Any changes since last visit?  no   No family history on file. Social History   Socioeconomic History  . Marital status: Divorced    Spouse name: Not on file  . Number of children: Not on file  . Years of education: Not on file  . Highest education level: Not on file  Social Needs  .  Financial resource strain: Not on file  . Food insecurity - worry: Not on file  . Food insecurity - inability: Not on file  . Transportation needs - medical: Not on file  . Transportation needs - non-medical: Not on file  Occupational History  . Not on file  Tobacco Use  . Smoking status: Former Smoker    Last attempt to quit: 01/04/2014    Years since quitting: 4.1  . Smokeless tobacco: Never Used  Substance and Sexual Activity  . Alcohol use: No  . Drug use: No  . Sexual activity: Not on file  Other Topics Concern  . Not on file  Social History Narrative  . Not on file   Past Surgical History:  Procedure Laterality Date  . ORIF ELBOW FRACTURE Left 01/04/2014   Procedure: OPEN REDUCTION INTERNAL FIXATION (ORIF) ELBOW/OLECRANON FRACTURE;  Surgeon: Alta Corning, MD;  Location: [place];  Service: Orthopedics;  Laterality: Left;  . ORIF HUMERUS FRACTURE Left 01/04/2014   Procedure: OPEN REDUCTION INTERNAL FIXATION (ORIF) HUMERAL SHAFT FRACTURE;  Surgeon: Alta Corning, MD;  Location: [place];  Service: Orthopedics;  Laterality: Left;  . ORIF RADIAL FRACTURE Left 01/04/2014   Procedure: OPEN REDUCTION INTERNAL FIXATION (ORIF) RADIAL FRACTURE;  Surgeon: Alta Corning, MD;  Location: [place];  Service: Orthopedics;  Laterality: Left;  . ORIF ULNAR FRACTURE Left 01/04/2014  Procedure: OPEN REDUCTION INTERNAL FIXATION (ORIF) ULNAR FRACTURE;  Surgeon: Alta Corning, MD;  Location: [place];  Service: Orthopedics;  Laterality: Left;  . PEG PLACEMENT N/A 01/12/2014   Procedure: PERCUTANEOUS ENDOSCOPIC GASTROSTOMY (PEG) PLACEMENT;  Surgeon: Gwenyth Ober, MD;  Location: [place];  Service: General;  Laterality: N/A;    bedside trach and peg  . PEG TUBE REMOVAL  02/13/2015  . PERCUTANEOUS TRACHEOSTOMY N/A 01/12/2014   Procedure: PERCUTANEOUS TRACHEOSTOMY;  Surgeon: Gwenyth Ober, MD;  Location: [place];  Service: General;  Laterality: N/A;  BEDSIDE TRACH  . TENDON TRANSFER Left 05/22/2017   Procedure:  TRANSFER PROFUNDUS TO SUPERFICIALIS LENGTHENING LEFT FINGERS, PROFUNDUS FUNCTIONAL SUPERFUNTIONALIS WEAK NEURECTOMY ULNAR NERVE WRIST;  Surgeon: Daryll Brod, MD;  Location: [place];  Service: Orthopedics;  Laterality: Left;  . TUBAL LIGATION  2003   Past Medical History:  Diagnosis Date  . Complication of anesthesia    severe crying, hypersensitivity to pain  . GERD (gastroesophageal reflux disease)   . History of traumatic brain injury 01/04/2014  . Hypertension    under control with med., has been on med. since 2015  . Spasticity    legs, feet, left arm, left hand  . Subluxation of left shoulder joint 01/04/2014   ongoing  . Unable to walk    There were no vitals taken for this visit.  Opioid Risk Score:   Fall Risk Score:  `1  Depression screen PHQ 2/9  Depression screen PHQ 2/9 12/29/2017  Decreased Interest 0  Down, Depressed, Hopeless 0  PHQ - 2 Score 0     Review of Systems  Constitutional: Negative.   HENT: Negative.   Eyes: Negative.   Respiratory: Negative.   Cardiovascular: Negative.   Gastrointestinal: Negative.   Endocrine: Negative.   Genitourinary: Negative.   Musculoskeletal: Positive for joint swelling.  Skin: Positive for rash.  Allergic/Immunologic: Negative.   Neurological: Negative.   Hematological: Negative.   Psychiatric/Behavioral: Positive for confusion and dysphoric mood. The patient is nervous/anxious.   All other systems reviewed and are negative.      Objective:   Physical Exam 3/4 tone left bicep--with less movement tolerated today comparative to 2 months ago., 2-3/4 left pec major/minor, 2/4 finger and wrist flexors. 4/4 left gastroc/soleus. RLE: 3/4 right hamstring with increased hip flexor tightness and external rotation of the leg.. 4/4 right gastroc/tibialis posterior--in severe equinovarus position with knee flexion too.   Speech remains relatively clear..  Attention and awareness much improved.   she is  overall pleasant but remains disinhibited at times.   HRT:  RRR Chest: normal effort Skin: intact, vaginal area not visualized   Assessment & Plan:  1.TBI with spastic tetraplegia:Continue with HEP. Will pursue -S/P tendon lengthening and transfers left wrist/fingers.  -pursue outpt therapy once we have some of social issues settled. Still in holding pattern.  Could potentially pursue this when she gets home later this spring. 2. This patient also requires a personal care attendant at all times.  3.Consider further botulinum toxin over the next couple months, 400u to left shoulder, biceps, brachioradialis when she is back at home.  Mother will call us to let us know and at that point we can schedule. 5. Maintain dantrium at 50mg  bid and 150mg  qhs--- unclear of dosing6. Don't recommend sublux sling due to her shoulder/pec tightness.  7. Continue tegretol to 300mg  tid for agitation/ mood lability--may be off of this now 8. Constipation: continue daily to bid miralax via  tube. Need to be more aggressive. Fiber is ok. rx for florastor  9.Skin:nystatin powder/ barrier cream prn.  Recommend dermatology follow-up for mole 10.Pain relief: tramadol or ibuprofenprior to therapies and before donning and doffing splintas well as per-operatively   20minutes of face to face patient care time were spent during this visit. All questions were encouraged and answered.Follow up  based on above

## 2018-03-05 DIAGNOSIS — G4700 Insomnia, unspecified: Secondary | ICD-10-CM | POA: Diagnosis not present

## 2018-03-05 DIAGNOSIS — S062X9D Diffuse traumatic brain injury with loss of consciousness of unspecified duration, subsequent encounter: Secondary | ICD-10-CM | POA: Diagnosis not present

## 2018-03-05 DIAGNOSIS — G8250 Quadriplegia, unspecified: Secondary | ICD-10-CM | POA: Diagnosis not present

## 2018-03-05 DIAGNOSIS — R600 Localized edema: Secondary | ICD-10-CM | POA: Diagnosis not present

## 2018-03-09 ENCOUNTER — Inpatient Hospital Stay (HOSPITAL_COMMUNITY): Payer: Medicare Other | Attending: Hematology | Admitting: Hematology

## 2018-03-09 ENCOUNTER — Inpatient Hospital Stay (HOSPITAL_COMMUNITY): Payer: Medicare Other

## 2018-03-09 ENCOUNTER — Encounter (HOSPITAL_COMMUNITY): Payer: Self-pay | Admitting: Hematology

## 2018-03-09 DIAGNOSIS — D500 Iron deficiency anemia secondary to blood loss (chronic): Secondary | ICD-10-CM | POA: Insufficient documentation

## 2018-03-09 DIAGNOSIS — G8250 Quadriplegia, unspecified: Secondary | ICD-10-CM | POA: Diagnosis not present

## 2018-03-09 DIAGNOSIS — K219 Gastro-esophageal reflux disease without esophagitis: Secondary | ICD-10-CM | POA: Diagnosis not present

## 2018-03-09 DIAGNOSIS — Z79899 Other long term (current) drug therapy: Secondary | ICD-10-CM | POA: Diagnosis not present

## 2018-03-09 DIAGNOSIS — I10 Essential (primary) hypertension: Secondary | ICD-10-CM | POA: Insufficient documentation

## 2018-03-09 DIAGNOSIS — Z87891 Personal history of nicotine dependence: Secondary | ICD-10-CM | POA: Diagnosis not present

## 2018-03-09 LAB — RETICULOCYTES
RBC.: 4.87 MIL/uL (ref 3.87–5.11)
RETIC COUNT ABSOLUTE: 77.9 10*3/uL (ref 19.0–186.0)
RETIC CT PCT: 1.6 % (ref 0.4–3.1)

## 2018-03-09 LAB — COMPREHENSIVE METABOLIC PANEL
ALK PHOS: 68 U/L (ref 38–126)
ALT: 16 U/L (ref 14–54)
AST: 23 U/L (ref 15–41)
Albumin: 3.9 g/dL (ref 3.5–5.0)
Anion gap: 10 (ref 5–15)
BILIRUBIN TOTAL: 0.7 mg/dL (ref 0.3–1.2)
BUN: 19 mg/dL (ref 6–20)
CALCIUM: 9.1 mg/dL (ref 8.9–10.3)
CO2: 24 mmol/L (ref 22–32)
CREATININE: 0.34 mg/dL — AB (ref 0.44–1.00)
Chloride: 105 mmol/L (ref 101–111)
Glucose, Bld: 92 mg/dL (ref 65–99)
Potassium: 4 mmol/L (ref 3.5–5.1)
Sodium: 139 mmol/L (ref 135–145)
TOTAL PROTEIN: 6.8 g/dL (ref 6.5–8.1)

## 2018-03-09 LAB — CBC WITH DIFFERENTIAL/PLATELET
Basophils Absolute: 0.1 10*3/uL (ref 0.0–0.1)
Basophils Relative: 1 %
EOS ABS: 0.4 10*3/uL (ref 0.0–0.7)
Eosinophils Relative: 8 %
HCT: 37.8 % (ref 36.0–46.0)
Hemoglobin: 10.2 g/dL — ABNORMAL LOW (ref 12.0–15.0)
LYMPHS PCT: 23 %
Lymphs Abs: 1.3 10*3/uL (ref 0.7–4.0)
MCH: 20.9 pg — ABNORMAL LOW (ref 26.0–34.0)
MCHC: 27 g/dL — ABNORMAL LOW (ref 30.0–36.0)
MCV: 77.6 fL — ABNORMAL LOW (ref 78.0–100.0)
MONO ABS: 0.2 10*3/uL (ref 0.1–1.0)
Monocytes Relative: 4 %
NEUTROS PCT: 64 %
Neutro Abs: 3.5 10*3/uL (ref 1.7–7.7)
PLATELETS: 226 10*3/uL (ref 150–400)
RBC: 4.87 MIL/uL (ref 3.87–5.11)
RDW: 29.1 % — ABNORMAL HIGH (ref 11.5–15.5)
WBC: 5.5 10*3/uL (ref 4.0–10.5)

## 2018-03-09 LAB — FERRITIN: Ferritin: 78 ng/mL (ref 11–307)

## 2018-03-09 LAB — IRON AND TIBC
IRON: 256 ug/dL — AB (ref 28–170)
Saturation Ratios: 84 % — ABNORMAL HIGH (ref 10.4–31.8)
TIBC: 305 ug/dL (ref 250–450)
UIBC: 49 ug/dL

## 2018-03-09 LAB — LACTATE DEHYDROGENASE: LDH: 114 U/L (ref 98–192)

## 2018-03-09 LAB — FOLATE: Folate: 11.9 ng/mL (ref >5.9)

## 2018-03-09 LAB — VITAMIN B12: VITAMIN B 12: 300 pg/mL (ref 180–914)
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
## 2018-03-09 NOTE — Assessment & Plan Note (Signed)
1.  Microcytic anemia: She has microcytic anemia from iron deficiency, likely from menstrual blood loss.  She does not report any bleeding per rectum or melena.  Her hemoglobin was 7.8 on 02/07/2018, which dropped to 7.1 on 02/13/2018.  She was started on iron tablet twice daily on 02/17/2018, which she is tolerating very well.  She has regular menses with bleeding lasting about 5-6 days every 28 days.  She reports it is moderate bleeding.  She does not report any B symptoms.  I have checked labs in my office today.  Hemoglobin improved to 10.2.  We have also sent a ferritin and iron panel which are pending.  As she had a very good improvement in her hemoglobin, she does not require any parenteral iron.  We will reevaluate her in 4-6 weeks.  I have also sent an LDH level, reticulocyte count and a serum protein electrophoresis to rule out any underlying plasma cell disorder.

## 2018-03-10 LAB — PROTEIN ELECTROPHORESIS, SERUM
A/G Ratio: 1.5 (ref 0.7–1.7)
ALPHA-1-GLOBULIN: 0.3 g/dL (ref 0.0–0.4)
ALPHA-2-GLOBULIN: 0.6 g/dL (ref 0.4–1.0)
Albumin ELP: 3.8 g/dL (ref 2.9–4.4)
Beta Globulin: 0.9 g/dL (ref 0.7–1.3)
GAMMA GLOBULIN: 0.8 g/dL (ref 0.4–1.8)
Globulin, Total: 2.6 g/dL (ref 2.2–3.9)
Total Protein ELP: 6.4 g/dL (ref 6.0–8.5)

## 2018-03-18 ENCOUNTER — Telehealth: Payer: Self-pay | Admitting: Physical Medicine & Rehabilitation

## 2018-03-18 NOTE — Telephone Encounter (Signed)
Recd req for FMLA documents for Mother - we need to wait for Court system to advise patient is being returned to home in order to complete paperwork for mother to be placed out of work to care for patient- will file paperwork request in [employer] up front admin.

## 2018-03-20 DIAGNOSIS — F17211 Nicotine dependence, cigarettes, in remission: Secondary | ICD-10-CM | POA: Diagnosis not present

## 2018-03-20 DIAGNOSIS — S062X9S Diffuse traumatic brain injury with loss of consciousness of unspecified duration, sequela: Secondary | ICD-10-CM | POA: Diagnosis not present

## 2018-03-20 DIAGNOSIS — F0632 Mood disorder due to known physiological condition with major depressive-like episode: Secondary | ICD-10-CM | POA: Diagnosis not present

## 2018-04-02 DIAGNOSIS — I10 Essential (primary) hypertension: Secondary | ICD-10-CM | POA: Diagnosis not present

## 2018-04-02 DIAGNOSIS — M6281 Muscle weakness (generalized): Secondary | ICD-10-CM | POA: Diagnosis not present

## 2018-04-02 DIAGNOSIS — M62838 Other muscle spasm: Secondary | ICD-10-CM | POA: Diagnosis not present

## 2018-04-02 DIAGNOSIS — G8250 Quadriplegia, unspecified: Secondary | ICD-10-CM | POA: Diagnosis not present

## 2018-04-03 ENCOUNTER — Telehealth: Payer: Self-pay

## 2018-04-03 DIAGNOSIS — G8250 Quadriplegia, unspecified: Secondary | ICD-10-CM

## 2018-04-03 DIAGNOSIS — Z87820 Personal history of traumatic brain injury: Secondary | ICD-10-CM

## 2018-04-03 NOTE — Telephone Encounter (Signed)
[place] worker called to inform us that this patient is no longer in the [facility] and is going to her home on Tuesday Apr 30th.   Stated she is needing to restart neurotherapy and her medications to be sent to [facility].

## 2018-04-06 NOTE — Telephone Encounter (Signed)
I would assume that mom has most of meds at home. Made referrals to neuro rehab

## 2018-04-07 DIAGNOSIS — M62838 Other muscle spasm: Secondary | ICD-10-CM | POA: Diagnosis not present

## 2018-04-07 DIAGNOSIS — I10 Essential (primary) hypertension: Secondary | ICD-10-CM | POA: Diagnosis not present

## 2018-04-07 DIAGNOSIS — G8250 Quadriplegia, unspecified: Secondary | ICD-10-CM | POA: Diagnosis not present

## 2018-04-07 DIAGNOSIS — M6281 Muscle weakness (generalized): Secondary | ICD-10-CM | POA: Diagnosis not present

## 2018-04-07 NOTE — Telephone Encounter (Signed)
Spoke with patients mother and informed that referral had been made. Encouraged to contact pharmacy when meds need to be refilled

## 2018-04-10 ENCOUNTER — Telehealth: Payer: Self-pay

## 2018-04-10 NOTE — Telephone Encounter (Signed)
Called and informed [name] that Dr. [name] will be singing off on [facility] orders.

## 2018-04-10 NOTE — Telephone Encounter (Signed)
yes

## 2018-04-10 NOTE — Telephone Encounter (Signed)
Kessa from [facility] called wanting to know if Dr. [name] will be the one signing for her [place] orders. Please advise

## 2018-04-13 ENCOUNTER — Encounter: Payer: Self-pay | Admitting: Physical Medicine & Rehabilitation

## 2018-04-14 ENCOUNTER — Encounter: Payer: Medicare Other | Attending: Physical Medicine & Rehabilitation | Admitting: Physical Medicine & Rehabilitation

## 2018-04-14 ENCOUNTER — Encounter: Payer: Self-pay | Admitting: Physical Medicine & Rehabilitation

## 2018-04-14 VITALS — BP 106/72 | HR 66 | Resp 14

## 2018-04-14 DIAGNOSIS — F5104 Psychophysiologic insomnia: Secondary | ICD-10-CM

## 2018-04-14 DIAGNOSIS — Z87891 Personal history of nicotine dependence: Secondary | ICD-10-CM | POA: Insufficient documentation

## 2018-04-14 DIAGNOSIS — B373 Candidiasis of vulva and vagina: Secondary | ICD-10-CM | POA: Diagnosis not present

## 2018-04-14 DIAGNOSIS — R4189 Other symptoms and signs involving cognitive functions and awareness: Secondary | ICD-10-CM

## 2018-04-14 DIAGNOSIS — Z90710 Acquired absence of both cervix and uterus: Secondary | ICD-10-CM | POA: Diagnosis not present

## 2018-04-14 DIAGNOSIS — S069X0S Unspecified intracranial injury without loss of consciousness, sequela: Secondary | ICD-10-CM | POA: Diagnosis not present

## 2018-04-14 DIAGNOSIS — F068 Other specified mental disorders due to known physiological condition: Secondary | ICD-10-CM

## 2018-04-14 DIAGNOSIS — K5900 Constipation, unspecified: Secondary | ICD-10-CM | POA: Diagnosis not present

## 2018-04-14 DIAGNOSIS — G8250 Quadriplegia, unspecified: Secondary | ICD-10-CM

## 2018-04-14 MED ORDER — DICLOFENAC SODIUM 75 MG PO TBEC: 75.0000 mg | DELAYED_RELEASE_TABLET | Freq: Two times a day (BID) | ORAL | 5 refills | Status: DC

## 2018-04-14 MED ORDER — TRAMADOL HCL 50 MG PO TABS: 50.0000 mg | ORAL_TABLET | Freq: Four times a day (QID) | ORAL | 0 refills | Status: DC | PRN

## 2018-04-14 MED ORDER — QUETIAPINE FUMARATE 100 MG PO TABS: 200.0000 mg | ORAL_TABLET | Freq: Every day | ORAL | 5 refills | Status: DC

## 2018-04-14 NOTE — Patient Instructions (Signed)
PLEASE FEEL FREE TO CALL OUR OFFICE WITH ANY PROBLEMS OR QUESTIONS ([phone])      

## 2018-04-14 NOTE — Progress Notes (Signed)
Subjective:    Patient ID: [name], female    DOB: 1981/02/25, 37 y.o.   MRN: [id number]  HPI   Patient is back regarding her chronic spastic tetraplegia.  She is now home with family.  Sleep has been a problem.  She got off track while at the facility.  Spasticity seems to have gotten a bit worse as well.  Cognitively she is remained at baseline.  Family has received referrals for outpatient therapies and is ready to get started with those.  Patient comes to visit today accompanied by father and mother as well as a side Education officer, museum.   Pain Inventory Average Pain 6 Pain Right Now 3 My pain is sharp and aching  In the last 24 hours, has pain interfered with the following? General activity 6 Relation with others 3 Enjoyment of life 5 What TIME of day is your pain at its worst? n/a Sleep (in general) NA  Pain is worse with: n/a Pain improves with: n/a Relief from Meds: n/a  Mobility ability to climb steps?  no do you drive?  no use a wheelchair needs help with transfers Do you have any goals in this area?  yes  Function disabled: date disabled . I need assistance with the following:  feeding, dressing, bathing, toileting, meal prep, household duties and shopping  Neuro/Psych bowel control problems confusion depression anxiety  Prior Studies Any changes since last visit?  yes  Physicians involved in your care Any changes since last visit?  yes   No family history on file. Social History   Socioeconomic History  . Marital status: Divorced    Spouse name: Not on file  . Number of children: Not on file  . Years of education: Not on file  . Highest education level: Not on file  Occupational History  . Not on file  Social Needs  . Financial resource strain: Not on file  . Food insecurity:    Worry: Not on file    Inability: Not on file  . Transportation needs:    Medical: Not on file    Non-medical: Not on file  Tobacco Use  . Smoking status:  Former Smoker    Last attempt to quit: 01/04/2014    Years since quitting: 4.2  . Smokeless tobacco: Never Used  Substance and Sexual Activity  . Alcohol use: No  . Drug use: No  . Sexual activity: Not on file  Lifestyle  . Physical activity:    Days per week: Not on file    Minutes per session: Not on file  . Stress: Not on file  Relationships  . Social connections:    Talks on phone: Not on file    Gets together: Not on file    Attends religious service: Not on file    Active member of club or organization: Not on file    Attends meetings of clubs or organizations: Not on file    Relationship status: Not on file  Other Topics Concern  . Not on file  Social History Narrative  . Not on file   Past Surgical History:  Procedure Laterality Date  . ORIF ELBOW FRACTURE Left 01/04/2014   Procedure: OPEN REDUCTION INTERNAL FIXATION (ORIF) ELBOW/OLECRANON FRACTURE;  Surgeon: Alta Corning, MD;  Location: [facility];  Service: Orthopedics;  Laterality: Left;  . ORIF HUMERUS FRACTURE Left 01/04/2014   Procedure: OPEN REDUCTION INTERNAL FIXATION (ORIF) HUMERAL SHAFT FRACTURE;  Surgeon: Alta Corning, MD;  Location: [facility]  OR;  Service: Orthopedics;  Laterality: Left;  . ORIF RADIAL FRACTURE Left 01/04/2014   Procedure: OPEN REDUCTION INTERNAL FIXATION (ORIF) RADIAL FRACTURE;  Surgeon: Alta Corning, MD;  Location: [facility];  Service: Orthopedics;  Laterality: Left;  . ORIF ULNAR FRACTURE Left 01/04/2014   Procedure: OPEN REDUCTION INTERNAL FIXATION (ORIF) ULNAR FRACTURE;  Surgeon: Alta Corning, MD;  Location: [place];  Service: Orthopedics;  Laterality: Left;  . PEG PLACEMENT N/A 01/12/2014   Procedure: PERCUTANEOUS ENDOSCOPIC GASTROSTOMY (PEG) PLACEMENT;  Surgeon: Gwenyth Ober, MD;  Location: [facility];  Service: General;  Laterality: N/A;    bedside trach and peg  . PEG TUBE REMOVAL  02/13/2015  . PERCUTANEOUS TRACHEOSTOMY N/A 01/12/2014   Procedure: PERCUTANEOUS TRACHEOSTOMY;  Surgeon: Gwenyth Ober,  MD;  Location: [facility];  Service: General;  Laterality: N/A;  BEDSIDE TRACH  . TENDON TRANSFER Left 05/22/2017   Procedure: TRANSFER PROFUNDUS TO SUPERFICIALIS LENGTHENING LEFT FINGERS, PROFUNDUS FUNCTIONAL SUPERFUNTIONALIS WEAK NEURECTOMY ULNAR NERVE WRIST;  Surgeon: Daryll Brod, MD;  Location: [facility];  Service: Orthopedics;  Laterality: Left;  . TUBAL LIGATION  2003   Past Medical History:  Diagnosis Date  . Complication of anesthesia    severe crying, hypersensitivity to pain  . GERD (gastroesophageal reflux disease)   . History of traumatic brain injury 01/04/2014  . Hypertension    under control with med., has been on med. since 2015  . Spasticity    legs, feet, left arm, left hand  . Subluxation of left shoulder joint 01/04/2014   ongoing  . Unable to walk    BP 106/72 (BP Location: Right Arm, Patient Position: Sitting, Cuff Size: Normal)   Pulse 66   Resp 14   SpO2 97%   Opioid Risk Score:   Fall Risk Score:  `1  Depression screen PHQ 2/9  Depression screen PHQ 2/9 12/29/2017  Decreased Interest 0  Down, Depressed, Hopeless 0  PHQ - 2 Score 0    Review of Systems  HENT: Negative.   Eyes: Negative.   Respiratory: Negative.   Cardiovascular: Positive for leg swelling.  Gastrointestinal: Positive for constipation.  Endocrine: Negative.   Genitourinary: Negative.   Musculoskeletal: Negative.   Skin: Negative.   Allergic/Immunologic: Negative.   Neurological: Negative.   Psychiatric/Behavioral: Positive for confusion and dysphoric mood. The patient is nervous/anxious.        Objective:   Physical Exam  3/4 tone left bicep--with less movement tolerated today comparative to 2 months ago., 2-3/4 left pec major/minor, 2/4 finger and wrist flexors. 4/4 left gastroc/soleus. RLE: 3/4 right hamstring with increased hip flexor tightness and external rotation of the leg..4/4 right gastroc/tibialis posterior--in severe equinovarus position withknee  flexion too--- motor and tone exam is unchanged from last visit.  Patient often resists movement voluntarily as well which only exacerbates pain and range of motion deficits.Speech remains relatively clear..Attention and awareness much improved.  she is overall pleasant but remains disinhibited at times.   .General: No acute distress HEENT: EOMI, oral membranes moist Cards: reg rate  Chest: normal effort Abdomen: Soft, NT, ND Skin: dry, intact Extremities: no edema      Assessment & Plan:  1.TBI with spastic tetraplegia:Continue with HEP. Will pursue -S/Ptendon lengtheningand transfersleft wrist/fingers.  -begin outpt therapy     2. This patient also requires a personal care attendant at all times.  3.Repeat botulinum toxin in a month.  400u to left shoulder, biceps, brachioradialis  . 5. Maintain dantrium at  50mg  bid and 150mg  qhs--- unclear of dosing6. Don't recommend sublux sling due to her shoulder/pec tightness.  7. Continue tegretol to 300mg  tid for agitation/ mood lability--may be off of this now 8. Constipation: continue daily to bid miralax via tube. Need to be more aggressive. Fiber is ok. rx for florastor 9.Skin:nystatinpowder/ barrier cream prn.  Recommend dermatology follow-up for mole 10.Pain relief: tramadol or ibuprofenprior to therapies and before donning and doffing splintas well as per-operatively. Wrote new rx for tramadol 50mg  q6 prn #90 to help with activity tolerance.  11. Insomnia: increase seroquel to 200mg  qhs.    15 minutes of face to face patient care time were spent during this visit. All questions were encouraged and answered. Follow up next month

## 2018-04-16 ENCOUNTER — Other Ambulatory Visit: Payer: Self-pay | Admitting: Physical Medicine & Rehabilitation

## 2018-04-16 ENCOUNTER — Telehealth: Payer: Self-pay

## 2018-04-16 NOTE — Telephone Encounter (Addendum)
Recieved electronic medication refill requests for :  Trazodone Pantoprazole Mobic Citalopram Lorazepam  Last note did not mention any of these medications but they were being prescribed up until Febuaray of 2019.  Please advise on wether we can refill or not.  In addition, also recieved refill request for Dantrium, it is listed as take 3 times a day but only 22 given and according to last note:  5. Maintain dantrium at 50mg  bid and 150mg  qhs---unclear of dosing6. Don't recommend sublux sling due to her shoulder/pec tightness.

## 2018-04-16 NOTE — Telephone Encounter (Signed)
Waiting on response from provider about medication refills

## 2018-04-17 ENCOUNTER — Other Ambulatory Visit: Payer: Self-pay | Admitting: Physical Medicine & Rehabilitation

## 2018-04-17 MED ORDER — CITALOPRAM HYDROBROMIDE 40 MG PO TABS: 40.0000 mg | ORAL_TABLET | Freq: Every day | ORAL | 2 refills | Status: DC

## 2018-04-17 MED ORDER — TRAZODONE HCL 50 MG PO TABS
ORAL_TABLET | ORAL | 2 refills | Status: DC
Start: 2018-04-17 — End: 2018-09-10

## 2018-04-17 MED ORDER — LORAZEPAM 0.5 MG PO TABS: ORAL_TABLET | ORAL | 1 refills | Status: DC

## 2018-04-17 MED ORDER — PANTOPRAZOLE SODIUM 40 MG PO TBEC: DELAYED_RELEASE_TABLET | ORAL | 2 refills | Status: DC

## 2018-04-17 MED ORDER — DANTROLENE SODIUM 50 MG PO CAPS: ORAL_CAPSULE | ORAL | 2 refills | Status: DC

## 2018-04-17 NOTE — Telephone Encounter (Signed)
Those are all RF's from SNF.   I want dantrium 50mg  QID.   I have RF'd all other meds and sent to pharmacy.

## 2018-04-23 ENCOUNTER — Other Ambulatory Visit: Payer: Self-pay | Admitting: Physical Medicine & Rehabilitation

## 2018-04-24 ENCOUNTER — Telehealth: Payer: Self-pay

## 2018-04-24 NOTE — Telephone Encounter (Signed)
[name] is calling to see if Dr. [name] is the one to sign the orders for pt cath recert

## 2018-04-24 NOTE — Telephone Encounter (Signed)
That's fine

## 2018-04-27 ENCOUNTER — Encounter: Payer: Self-pay | Admitting: Physical Medicine & Rehabilitation

## 2018-04-27 DIAGNOSIS — F068 Other specified mental disorders due to known physiological condition: Secondary | ICD-10-CM

## 2018-04-27 DIAGNOSIS — R4189 Other symptoms and signs involving cognitive functions and awareness: Secondary | ICD-10-CM

## 2018-04-27 DIAGNOSIS — G8250 Quadriplegia, unspecified: Secondary | ICD-10-CM

## 2018-04-27 DIAGNOSIS — S069X0S Unspecified intracranial injury without loss of consciousness, sequela: Secondary | ICD-10-CM

## 2018-04-29 DIAGNOSIS — Z87891 Personal history of nicotine dependence: Secondary | ICD-10-CM | POA: Diagnosis not present

## 2018-04-29 DIAGNOSIS — Z79891 Long term (current) use of opiate analgesic: Secondary | ICD-10-CM | POA: Diagnosis not present

## 2018-04-29 DIAGNOSIS — Z791 Long term (current) use of non-steroidal anti-inflammatories (NSAID): Secondary | ICD-10-CM | POA: Diagnosis not present

## 2018-04-29 DIAGNOSIS — G800 Spastic quadriplegic cerebral palsy: Secondary | ICD-10-CM | POA: Diagnosis not present

## 2018-04-29 DIAGNOSIS — I10 Essential (primary) hypertension: Secondary | ICD-10-CM | POA: Diagnosis not present

## 2018-04-29 DIAGNOSIS — Z87820 Personal history of traumatic brain injury: Secondary | ICD-10-CM | POA: Diagnosis not present

## 2018-04-29 DIAGNOSIS — K219 Gastro-esophageal reflux disease without esophagitis: Secondary | ICD-10-CM | POA: Diagnosis not present

## 2018-04-29 NOTE — Telephone Encounter (Signed)
Notified. 

## 2018-05-06 NOTE — Telephone Encounter (Signed)
orders written for therapy

## 2018-05-11 ENCOUNTER — Encounter: Payer: Self-pay | Admitting: Physical Medicine & Rehabilitation

## 2018-05-13 ENCOUNTER — Encounter: Payer: Medicare Other | Admitting: Physical Medicine & Rehabilitation

## 2018-05-19 ENCOUNTER — Telehealth: Payer: Self-pay

## 2018-05-19 NOTE — Telephone Encounter (Signed)
Anna Sizemore-Guardianship Social Worker from [employer] called stating she will not be able to come to pt appt.tomorrow and that a consent for treatment can be faxed to her to sign.   Fax # [phone]  Phone # [phone] ext [phone]

## 2018-05-20 ENCOUNTER — Encounter: Payer: Medicare Other | Attending: Physical Medicine & Rehabilitation | Admitting: Physical Medicine & Rehabilitation

## 2018-05-20 ENCOUNTER — Encounter: Payer: Self-pay | Admitting: Physical Medicine & Rehabilitation

## 2018-05-20 DIAGNOSIS — B373 Candidiasis of vulva and vagina: Secondary | ICD-10-CM | POA: Diagnosis not present

## 2018-05-20 DIAGNOSIS — G8250 Quadriplegia, unspecified: Secondary | ICD-10-CM | POA: Diagnosis not present

## 2018-05-20 DIAGNOSIS — K5900 Constipation, unspecified: Secondary | ICD-10-CM | POA: Insufficient documentation

## 2018-05-20 DIAGNOSIS — Z90710 Acquired absence of both cervix and uterus: Secondary | ICD-10-CM | POA: Insufficient documentation

## 2018-05-20 DIAGNOSIS — Z87891 Personal history of nicotine dependence: Secondary | ICD-10-CM | POA: Diagnosis not present

## 2018-05-20 NOTE — Progress Notes (Signed)
Botox Injection for spasticity using needle EMG guidance Indication: Spastic tetraplegia ([place]) - Plan: Ambulatory referral to Physical Therapy, Ambulatory referral to Occupational Therapy   Dilution: 100 Units/ml        Total Units Injected: 400  Location: LUE  Indication: Severe spasticity which interferes with ADL,mobility and/or  hygiene and is unresponsive to medication management and other conservative care Informed consent was obtained after describing risks and benefits of the procedure with the patient. This includes bleeding, bruising, infection, excessive weakness, or medication side effects. A REMS form is on file and signed.  Needle: 8mm injectable monopolar needle electrode  Number of units per muscle Pectoralis Major 50 units Pectoralis Minor 50 units Biceps/brachioradialis 200 units 4 access points Brachioradialis 100 units FCR 0 units FCU 0 units FDS 0 units FDP 0 units FPL 0 units Pronator Teres 0 units Pronator Quadratus 0 units  All injections were done after obtaining appropriate EMG activity and after negative drawback for blood. The patient tolerated the procedure well. Post procedure instructions were given. Return in about 2 months (around 07/20/2018).                     UDS not preformed today by order of doctor, he states to preform it on a follow up visit due to her receiving botox injections today.

## 2018-05-20 NOTE — Patient Instructions (Signed)
PLEASE FEEL FREE TO CALL OUR OFFICE WITH ANY PROBLEMS OR QUESTIONS ([phone])      

## 2018-05-20 NOTE — Progress Notes (Addendum)
In addition to UDS not being preformed today, CSA/Consent not preformed either, both will be completed on next patient follow.

## 2018-05-29 ENCOUNTER — Telehealth: Payer: Self-pay | Admitting: *Deleted

## 2018-05-29 NOTE — Telephone Encounter (Signed)
Left a voicemail asking for a call back

## 2018-05-29 NOTE — Telephone Encounter (Signed)
Rip Harbour called back asking for a letter from Dr. [name] stating that the patient cannot reschedule her appointment on June 25th, 2019 for therapy.

## 2018-05-29 NOTE — Telephone Encounter (Signed)
Letter written, signature stamped, and faxed to [facility] [employer] [facility] [employer]

## 2018-05-29 NOTE — Telephone Encounter (Signed)
Lenward Chancellor from [facility] left a message asking for a call back from clinical staff regarding patients physical therapy and transportation.

## 2018-06-02 ENCOUNTER — Ambulatory Visit: Payer: Medicare Other | Admitting: Physical Therapy

## 2018-06-02 ENCOUNTER — Encounter: Payer: Medicare Other | Admitting: Occupational Therapy

## 2018-06-08 ENCOUNTER — Other Ambulatory Visit: Payer: Self-pay

## 2018-06-08 ENCOUNTER — Ambulatory Visit: Payer: Medicare Other | Attending: Physical Medicine & Rehabilitation | Admitting: Occupational Therapy

## 2018-06-08 ENCOUNTER — Encounter: Payer: Self-pay | Admitting: Occupational Therapy

## 2018-06-08 DIAGNOSIS — M25662 Stiffness of left knee, not elsewhere classified: Secondary | ICD-10-CM | POA: Insufficient documentation

## 2018-06-08 DIAGNOSIS — R41844 Frontal lobe and executive function deficit: Secondary | ICD-10-CM | POA: Diagnosis not present

## 2018-06-08 DIAGNOSIS — M25622 Stiffness of left elbow, not elsewhere classified: Secondary | ICD-10-CM | POA: Diagnosis not present

## 2018-06-08 DIAGNOSIS — R29898 Other symptoms and signs involving the musculoskeletal system: Secondary | ICD-10-CM

## 2018-06-08 DIAGNOSIS — R29818 Other symptoms and signs involving the nervous system: Secondary | ICD-10-CM | POA: Insufficient documentation

## 2018-06-08 DIAGNOSIS — M6249 Contracture of muscle, multiple sites: Secondary | ICD-10-CM | POA: Diagnosis not present

## 2018-06-08 DIAGNOSIS — M79602 Pain in left arm: Secondary | ICD-10-CM | POA: Diagnosis not present

## 2018-06-08 DIAGNOSIS — R293 Abnormal posture: Secondary | ICD-10-CM | POA: Diagnosis not present

## 2018-06-08 DIAGNOSIS — M79605 Pain in left leg: Secondary | ICD-10-CM | POA: Insufficient documentation

## 2018-06-08 DIAGNOSIS — M6281 Muscle weakness (generalized): Secondary | ICD-10-CM | POA: Insufficient documentation

## 2018-06-08 DIAGNOSIS — I10 Essential (primary) hypertension: Secondary | ICD-10-CM | POA: Diagnosis not present

## 2018-06-08 DIAGNOSIS — S069X0S Unspecified intracranial injury without loss of consciousness, sequela: Secondary | ICD-10-CM | POA: Diagnosis not present

## 2018-06-08 DIAGNOSIS — G8250 Quadriplegia, unspecified: Secondary | ICD-10-CM | POA: Diagnosis not present
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
## 2018-06-16 ENCOUNTER — Ambulatory Visit: Payer: Medicare Other | Admitting: Occupational Therapy

## 2018-06-16 DIAGNOSIS — M79605 Pain in left leg: Secondary | ICD-10-CM | POA: Diagnosis not present

## 2018-06-16 DIAGNOSIS — R29818 Other symptoms and signs involving the nervous system: Secondary | ICD-10-CM | POA: Diagnosis not present

## 2018-06-16 DIAGNOSIS — M6249 Contracture of muscle, multiple sites: Secondary | ICD-10-CM

## 2018-06-16 DIAGNOSIS — R293 Abnormal posture: Secondary | ICD-10-CM

## 2018-06-16 DIAGNOSIS — M6281 Muscle weakness (generalized): Secondary | ICD-10-CM

## 2018-06-16 DIAGNOSIS — R29898 Other symptoms and signs involving the musculoskeletal system: Secondary | ICD-10-CM

## 2018-06-16 DIAGNOSIS — G8250 Quadriplegia, unspecified: Secondary | ICD-10-CM | POA: Diagnosis not present

## 2018-06-16 DIAGNOSIS — M79602 Pain in left arm: Secondary | ICD-10-CM

## 2018-06-16 DIAGNOSIS — M25622 Stiffness of left elbow, not elsewhere classified: Secondary | ICD-10-CM

## 2018-06-16 DIAGNOSIS — M25662 Stiffness of left knee, not elsewhere classified: Secondary | ICD-10-CM | POA: Diagnosis not present

## 2018-06-16 DIAGNOSIS — S069X0S Unspecified intracranial injury without loss of consciousness, sequela: Secondary | ICD-10-CM | POA: Diagnosis not present
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
## 2018-06-17 ENCOUNTER — Ambulatory Visit: Payer: Medicare Other | Admitting: Physical Therapy

## 2018-06-19 ENCOUNTER — Ambulatory Visit: Payer: Medicare Other | Admitting: Physical Therapy

## 2018-06-19 ENCOUNTER — Other Ambulatory Visit: Payer: Self-pay

## 2018-06-19 ENCOUNTER — Encounter: Payer: Self-pay | Admitting: Physical Therapy

## 2018-06-19 DIAGNOSIS — S069X0S Unspecified intracranial injury without loss of consciousness, sequela: Secondary | ICD-10-CM | POA: Diagnosis not present

## 2018-06-19 DIAGNOSIS — M79605 Pain in left leg: Secondary | ICD-10-CM | POA: Diagnosis not present

## 2018-06-19 DIAGNOSIS — M6281 Muscle weakness (generalized): Secondary | ICD-10-CM

## 2018-06-19 DIAGNOSIS — G8250 Quadriplegia, unspecified: Secondary | ICD-10-CM | POA: Diagnosis not present

## 2018-06-19 DIAGNOSIS — M25662 Stiffness of left knee, not elsewhere classified: Secondary | ICD-10-CM

## 2018-06-19 DIAGNOSIS — R293 Abnormal posture: Secondary | ICD-10-CM

## 2018-06-19 DIAGNOSIS — R29818 Other symptoms and signs involving the nervous system: Secondary | ICD-10-CM
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
## 2018-06-22 DIAGNOSIS — Z79891 Long term (current) use of opiate analgesic: Secondary | ICD-10-CM | POA: Diagnosis not present

## 2018-06-22 DIAGNOSIS — K219 Gastro-esophageal reflux disease without esophagitis: Secondary | ICD-10-CM | POA: Diagnosis not present

## 2018-06-22 DIAGNOSIS — G800 Spastic quadriplegic cerebral palsy: Secondary | ICD-10-CM | POA: Diagnosis not present

## 2018-06-22 DIAGNOSIS — Z791 Long term (current) use of non-steroidal anti-inflammatories (NSAID): Secondary | ICD-10-CM | POA: Diagnosis not present

## 2018-06-22 DIAGNOSIS — I10 Essential (primary) hypertension: Secondary | ICD-10-CM | POA: Diagnosis not present

## 2018-06-22 DIAGNOSIS — Z87891 Personal history of nicotine dependence: Secondary | ICD-10-CM | POA: Diagnosis not present

## 2018-06-22 DIAGNOSIS — Z87820 Personal history of traumatic brain injury: Secondary | ICD-10-CM | POA: Diagnosis not present

## 2018-06-23 ENCOUNTER — Ambulatory Visit: Payer: Medicare Other | Admitting: Occupational Therapy

## 2018-06-23 ENCOUNTER — Encounter: Payer: Self-pay | Admitting: Occupational Therapy

## 2018-06-23 DIAGNOSIS — R29818 Other symptoms and signs involving the nervous system: Secondary | ICD-10-CM | POA: Diagnosis not present

## 2018-06-23 DIAGNOSIS — M25512 Pain in left shoulder: Secondary | ICD-10-CM

## 2018-06-23 DIAGNOSIS — M79605 Pain in left leg: Secondary | ICD-10-CM | POA: Diagnosis not present

## 2018-06-23 DIAGNOSIS — M79602 Pain in left arm: Secondary | ICD-10-CM

## 2018-06-23 DIAGNOSIS — R29898 Other symptoms and signs involving the musculoskeletal system: Secondary | ICD-10-CM

## 2018-06-23 DIAGNOSIS — S069X0S Unspecified intracranial injury without loss of consciousness, sequela: Secondary | ICD-10-CM | POA: Diagnosis not present

## 2018-06-23 DIAGNOSIS — F068 Other specified mental disorders due to known physiological condition: Secondary | ICD-10-CM

## 2018-06-23 DIAGNOSIS — G8929 Other chronic pain: Secondary | ICD-10-CM

## 2018-06-23 DIAGNOSIS — R41844 Frontal lobe and executive function deficit: Secondary | ICD-10-CM

## 2018-06-23 DIAGNOSIS — G8250 Quadriplegia, unspecified: Secondary | ICD-10-CM | POA: Diagnosis not present

## 2018-06-23 DIAGNOSIS — M25662 Stiffness of left knee, not elsewhere classified: Secondary | ICD-10-CM | POA: Diagnosis not present

## 2018-06-23 DIAGNOSIS — M6281 Muscle weakness (generalized): Secondary | ICD-10-CM | POA: Diagnosis not present

## 2018-06-23 DIAGNOSIS — M25522 Pain in left elbow: Secondary | ICD-10-CM

## 2018-06-23 DIAGNOSIS — R4189 Other symptoms and signs involving cognitive functions and awareness: Secondary | ICD-10-CM

## 2018-06-23 NOTE — Patient Instructions (Signed)
Splint wearing schedule:  Start with 30 minutes per day, 4 times per day. You can leave if off about an hour and half in between.  We talked about a reward system for Denys and we also talked about using a visual board for her rewards.
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
## 2018-06-24 NOTE — Telephone Encounter (Signed)
done

## 2018-06-26 ENCOUNTER — Ambulatory Visit: Payer: Medicare Other | Admitting: Rehabilitation

## 2018-06-26 ENCOUNTER — Encounter: Payer: Self-pay | Admitting: Rehabilitation

## 2018-06-26 DIAGNOSIS — M25662 Stiffness of left knee, not elsewhere classified: Secondary | ICD-10-CM | POA: Diagnosis not present

## 2018-06-26 DIAGNOSIS — R29818 Other symptoms and signs involving the nervous system: Secondary | ICD-10-CM | POA: Diagnosis not present

## 2018-06-26 DIAGNOSIS — M6281 Muscle weakness (generalized): Secondary | ICD-10-CM

## 2018-06-26 DIAGNOSIS — S069X0S Unspecified intracranial injury without loss of consciousness, sequela: Secondary | ICD-10-CM | POA: Diagnosis not present

## 2018-06-26 DIAGNOSIS — R293 Abnormal posture: Secondary | ICD-10-CM

## 2018-06-26 DIAGNOSIS — M79605 Pain in left leg: Secondary | ICD-10-CM | POA: Diagnosis not present

## 2018-06-26 DIAGNOSIS — G8250 Quadriplegia, unspecified: Secondary | ICD-10-CM | POA: Diagnosis not present
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
## 2018-06-26 NOTE — Patient Instructions (Signed)
Things to work on at home with [place]:   1: While lying in bed, have [place] work on bending left knee and moving knee from bed towards right leg.  Let her do as much as she can and then assist and provide light pressure for a light stretch.  Let her guide you with how much pressure you can apply.  Hold for 30 secs and do 3 times.  Do in the morning and at night.    2:  While lying in bed, have [place] work on rotating right knee in and then pressing it down as far as she can.  Again apply a little pressure above her knee as she can tolerate.  Hold for 30 secs.  Do 3 times.  Do in the morning and in the afternoon.    3:  Help [place] get onto her R side (ger her most of the way) and then see if she can use her trunk to assist in getting all the onto R side.  Have her slowly go back to starting position (on side, not all the way back to back).  Do 10 reps.  Morning and night.  4:  Help [place]is get onto her L side and have her either use your hand to gentle pull upward (trying to use stomach muscles and not all hand) or try and use trapeze bar if you can get it attached.  Do 10 lifts.  Morning and night.    5:  Sit bed up slightly (approx 45 degrees) and have her try to lift off back of bed using stomach muscles and not right hand.  Adjust as you need to, but don't sit bed all the way up.  Do 10 times.  Morning and night.

## 2018-06-30 ENCOUNTER — Ambulatory Visit: Payer: Medicare Other | Admitting: Occupational Therapy

## 2018-06-30 ENCOUNTER — Encounter: Payer: Self-pay | Admitting: Occupational Therapy

## 2018-06-30 DIAGNOSIS — R29818 Other symptoms and signs involving the nervous system: Secondary | ICD-10-CM

## 2018-06-30 DIAGNOSIS — M25662 Stiffness of left knee, not elsewhere classified: Secondary | ICD-10-CM | POA: Diagnosis not present

## 2018-06-30 DIAGNOSIS — G8250 Quadriplegia, unspecified: Secondary | ICD-10-CM | POA: Diagnosis not present

## 2018-06-30 DIAGNOSIS — S069X0S Unspecified intracranial injury without loss of consciousness, sequela: Secondary | ICD-10-CM | POA: Diagnosis not present

## 2018-06-30 DIAGNOSIS — R29898 Other symptoms and signs involving the musculoskeletal system: Secondary | ICD-10-CM

## 2018-06-30 DIAGNOSIS — M25522 Pain in left elbow: Secondary | ICD-10-CM

## 2018-06-30 DIAGNOSIS — M25622 Stiffness of left elbow, not elsewhere classified: Secondary | ICD-10-CM

## 2018-06-30 DIAGNOSIS — M79602 Pain in left arm: Secondary | ICD-10-CM

## 2018-06-30 DIAGNOSIS — M6281 Muscle weakness (generalized): Secondary | ICD-10-CM

## 2018-06-30 DIAGNOSIS — M6249 Contracture of muscle, multiple sites: Secondary | ICD-10-CM

## 2018-06-30 DIAGNOSIS — M79605 Pain in left leg: Secondary | ICD-10-CM | POA: Diagnosis not present

## 2018-06-30 DIAGNOSIS — R293 Abnormal posture: Secondary | ICD-10-CM
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
## 2018-07-02 ENCOUNTER — Other Ambulatory Visit: Payer: Self-pay | Admitting: Physical Medicine & Rehabilitation

## 2018-07-03 ENCOUNTER — Ambulatory Visit: Payer: Medicare Other | Admitting: Occupational Therapy

## 2018-07-03 ENCOUNTER — Encounter: Payer: Self-pay | Admitting: Rehabilitation

## 2018-07-03 ENCOUNTER — Ambulatory Visit: Payer: Medicare Other | Admitting: Rehabilitation

## 2018-07-03 ENCOUNTER — Telehealth: Payer: Self-pay | Admitting: Rehabilitation

## 2018-07-03 DIAGNOSIS — R29818 Other symptoms and signs involving the nervous system: Secondary | ICD-10-CM

## 2018-07-03 DIAGNOSIS — M6249 Contracture of muscle, multiple sites: Secondary | ICD-10-CM

## 2018-07-03 DIAGNOSIS — M79605 Pain in left leg: Secondary | ICD-10-CM | POA: Diagnosis not present

## 2018-07-03 DIAGNOSIS — M25662 Stiffness of left knee, not elsewhere classified: Secondary | ICD-10-CM

## 2018-07-03 DIAGNOSIS — R29898 Other symptoms and signs involving the musculoskeletal system: Secondary | ICD-10-CM

## 2018-07-03 DIAGNOSIS — M79602 Pain in left arm: Secondary | ICD-10-CM

## 2018-07-03 DIAGNOSIS — M6281 Muscle weakness (generalized): Secondary | ICD-10-CM | POA: Diagnosis not present

## 2018-07-03 DIAGNOSIS — R293 Abnormal posture: Secondary | ICD-10-CM

## 2018-07-03 DIAGNOSIS — G8250 Quadriplegia, unspecified: Secondary | ICD-10-CM | POA: Diagnosis not present

## 2018-07-03 DIAGNOSIS — M25622 Stiffness of left elbow, not elsewhere classified: Secondary | ICD-10-CM

## 2018-07-03 DIAGNOSIS — S069X0S Unspecified intracranial injury without loss of consciousness, sequela: Secondary | ICD-10-CM | POA: Diagnosis not present

## 2018-07-03 DIAGNOSIS — Z87820 Personal history of traumatic brain injury: Secondary | ICD-10-CM

## 2018-07-03 DIAGNOSIS — M25522 Pain in left elbow: Secondary | ICD-10-CM
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
## 2018-07-03 NOTE — Patient Instructions (Signed)
Things to work on at home with [place]:   1: While lying in bed, have [place] work on bending left knee and moving knee from bed towards right leg.  Let her do as much as she can and then assist and provide light pressure for a light stretch.  Let her guide you with how much pressure you can apply.  Hold for 30 secs and do 3 times.  Do in the morning and at night.    2:  While lying in bed, have [place] work on rotating right knee in and then pressing it down as far as she can.  Again apply a little pressure above her knee as she can tolerate.  Hold for 30 secs.  Do 3 times.  Do in the morning and in the afternoon.    3:  Help [place] get onto her R side (ger her most of the way) and then see if she can use her trunk to assist in getting all the onto R side.  Have her slowly go back to starting position (on side, not all the way back to back).  Do 10 reps.  Morning and night.  4:  Help [place] get onto her L side and have her either use your hand to gentle pull upward (trying to use stomach muscles and not all hand) or try and use trapeze bar if you can get it attached.  Do 10 lifts.  Morning and night.    5:  Sit bed up slightly (approx 45 degrees) and have her try to lift off back of bed using stomach muscles and not right hand.  Adjust as you need to, but don't sit bed all the way up.  Do 10 times.  Morning and night.    6:  Rolling to the left using R arm to punch across body, use head also.  Take rail down and have mom or dad stand on left side.  Rolling to the right, assist with left leg (have her bend and take towards the right) and you guys assist by using the pad.  Do 4 rolls in each direction x 2 times per day.
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
## 2018-07-03 NOTE — Telephone Encounter (Signed)
Dr. [name],   As you know we are seeing [name] at [facility] neuro for PT/OT.  They initially stated that they did not want to pursue a new w/c, however at their last visit, they all were wanting to pursue a new w/c.  Will you please write an order in Epic for w/c evaluation and we can get that scheduled when they come in next.    Thanks,  Cameron Sprang, PT, MPT [facility] [address] [place], [place], [address] Phone: [phone]   Fax:  [phone] 07/03/18, 8:11 AM

## 2018-07-06 ENCOUNTER — Ambulatory Visit: Payer: Medicare Other | Admitting: Occupational Therapy

## 2018-07-06 NOTE — Telephone Encounter (Signed)
Order written, thanks.

## 2018-07-09 ENCOUNTER — Ambulatory Visit: Payer: Medicare Other | Attending: Physical Medicine & Rehabilitation | Admitting: Physical Therapy

## 2018-07-09 DIAGNOSIS — M25662 Stiffness of left knee, not elsewhere classified: Secondary | ICD-10-CM | POA: Diagnosis not present

## 2018-07-09 DIAGNOSIS — R29818 Other symptoms and signs involving the nervous system: Secondary | ICD-10-CM | POA: Diagnosis not present

## 2018-07-09 DIAGNOSIS — M25522 Pain in left elbow: Secondary | ICD-10-CM | POA: Diagnosis not present

## 2018-07-09 DIAGNOSIS — M25512 Pain in left shoulder: Secondary | ICD-10-CM | POA: Diagnosis not present

## 2018-07-09 DIAGNOSIS — R41844 Frontal lobe and executive function deficit: Secondary | ICD-10-CM | POA: Insufficient documentation

## 2018-07-09 DIAGNOSIS — R29898 Other symptoms and signs involving the musculoskeletal system: Secondary | ICD-10-CM | POA: Diagnosis not present

## 2018-07-09 DIAGNOSIS — M25622 Stiffness of left elbow, not elsewhere classified: Secondary | ICD-10-CM | POA: Diagnosis not present

## 2018-07-09 DIAGNOSIS — M79605 Pain in left leg: Secondary | ICD-10-CM | POA: Diagnosis not present

## 2018-07-09 DIAGNOSIS — G8929 Other chronic pain: Secondary | ICD-10-CM | POA: Insufficient documentation

## 2018-07-09 DIAGNOSIS — M79602 Pain in left arm: Secondary | ICD-10-CM | POA: Insufficient documentation

## 2018-07-09 DIAGNOSIS — R293 Abnormal posture: Secondary | ICD-10-CM | POA: Insufficient documentation

## 2018-07-09 DIAGNOSIS — M6281 Muscle weakness (generalized): Secondary | ICD-10-CM
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
## 2018-07-13 ENCOUNTER — Ambulatory Visit: Payer: Medicare Other | Admitting: Rehabilitation

## 2018-07-13 ENCOUNTER — Ambulatory Visit: Payer: Medicare Other | Admitting: Occupational Therapy

## 2018-07-13 ENCOUNTER — Encounter: Payer: Self-pay | Admitting: Occupational Therapy

## 2018-07-13 ENCOUNTER — Encounter: Payer: Self-pay | Admitting: Rehabilitation

## 2018-07-13 DIAGNOSIS — M6281 Muscle weakness (generalized): Secondary | ICD-10-CM | POA: Diagnosis not present

## 2018-07-13 DIAGNOSIS — M25622 Stiffness of left elbow, not elsewhere classified: Secondary | ICD-10-CM

## 2018-07-13 DIAGNOSIS — R29818 Other symptoms and signs involving the nervous system: Secondary | ICD-10-CM | POA: Diagnosis not present

## 2018-07-13 DIAGNOSIS — M79605 Pain in left leg: Secondary | ICD-10-CM

## 2018-07-13 DIAGNOSIS — M25522 Pain in left elbow: Secondary | ICD-10-CM

## 2018-07-13 DIAGNOSIS — M25662 Stiffness of left knee, not elsewhere classified: Secondary | ICD-10-CM | POA: Diagnosis not present

## 2018-07-13 DIAGNOSIS — R29898 Other symptoms and signs involving the musculoskeletal system: Secondary | ICD-10-CM

## 2018-07-13 DIAGNOSIS — R293 Abnormal posture: Secondary | ICD-10-CM | POA: Diagnosis not present

## 2018-07-13 DIAGNOSIS — R41844 Frontal lobe and executive function deficit: Secondary | ICD-10-CM

## 2018-07-13 DIAGNOSIS — M79602 Pain in left arm: Secondary | ICD-10-CM

## 2018-07-13 NOTE — Patient Instructions (Signed)
Your Splint This splint should initially be fitted by a healthcare practitioner.  The healthcare practitioner is responsible for providing wearing instructions and precautions to the patient, other healthcare practitioners and care provider involved in the patient's care.  This splint was custom made for you. Please read the following instructions to learn about wearing and caring for your splint.  Precautions Should your splint cause any of the following problems, remove the splint immediately and contact your therapist/physician.  Swelling  Severe Pain  Pressure Areas  Stiffness  Numbness  Do not wear your splint while operating machinery unless it has been fabricated for that purpose.  When To Wear Your Splint Where your splint according to your therapist/physician instructions. 30 minutes at a time for 2 times a day  The first 3-4 days.  If no problems after the first 3-4 days, gradually increase the wear time to 2-3 hours at a time as tolerated.  (Alternate with elbow splint).  Care and Cleaning of Your Splint 1. Keep your splint away from open flames. 2. Your splint will lose its shape in temperatures over 135 degrees Farenheit, ( in car windows, near radiators, ovens or in hot water).  Never make any adjustments to your splint, if the splint needs adjusting remove it and make an appointment to see your therapist. 3. Your splint, including the cushion liner may be cleaned with soap and lukewarm water.  Do not immerse in hot water over 135 degrees Farenheit. 4. Straps may be washed with soap and water, but do not moisten the self-adhesive portion. 5. For ink or hard to remove spots use a scouring cleanser which contains chlorine.  Rinse the splint thoroughly after using chlorine cleanser.
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
## 2018-07-14 DIAGNOSIS — L720 Epidermal cyst: Secondary | ICD-10-CM | POA: Diagnosis not present

## 2018-07-14 DIAGNOSIS — D2272 Melanocytic nevi of left lower limb, including hip: Secondary | ICD-10-CM | POA: Diagnosis not present

## 2018-07-14 DIAGNOSIS — D485 Neoplasm of uncertain behavior of skin: Secondary | ICD-10-CM | POA: Diagnosis not present

## 2018-07-17 ENCOUNTER — Encounter: Payer: Self-pay | Admitting: Rehabilitation

## 2018-07-17 ENCOUNTER — Ambulatory Visit: Payer: Medicare Other | Admitting: Rehabilitation

## 2018-07-17 ENCOUNTER — Ambulatory Visit: Payer: Medicare Other | Admitting: Occupational Therapy

## 2018-07-17 ENCOUNTER — Encounter: Payer: Self-pay | Admitting: Occupational Therapy

## 2018-07-17 DIAGNOSIS — M25662 Stiffness of left knee, not elsewhere classified: Secondary | ICD-10-CM | POA: Diagnosis not present

## 2018-07-17 DIAGNOSIS — R29818 Other symptoms and signs involving the nervous system: Secondary | ICD-10-CM | POA: Diagnosis not present

## 2018-07-17 DIAGNOSIS — R293 Abnormal posture: Secondary | ICD-10-CM

## 2018-07-17 DIAGNOSIS — M6281 Muscle weakness (generalized): Secondary | ICD-10-CM | POA: Diagnosis not present

## 2018-07-17 DIAGNOSIS — M79602 Pain in left arm: Secondary | ICD-10-CM

## 2018-07-17 DIAGNOSIS — R41844 Frontal lobe and executive function deficit: Secondary | ICD-10-CM

## 2018-07-17 DIAGNOSIS — R29898 Other symptoms and signs involving the musculoskeletal system: Secondary | ICD-10-CM | POA: Diagnosis not present

## 2018-07-17 DIAGNOSIS — M79605 Pain in left leg: Secondary | ICD-10-CM

## 2018-07-17 DIAGNOSIS — M25622 Stiffness of left elbow, not elsewhere classified: Secondary | ICD-10-CM

## 2018-07-17 DIAGNOSIS — M25522 Pain in left elbow: Secondary | ICD-10-CM
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
## 2018-07-20 ENCOUNTER — Ambulatory Visit: Payer: Medicare Other | Admitting: Physical Therapy

## 2018-07-20 ENCOUNTER — Encounter: Payer: Self-pay | Admitting: Occupational Therapy

## 2018-07-20 ENCOUNTER — Other Ambulatory Visit: Payer: Self-pay | Admitting: Physical Medicine & Rehabilitation

## 2018-07-20 ENCOUNTER — Ambulatory Visit: Payer: Medicare Other | Admitting: Occupational Therapy

## 2018-07-20 DIAGNOSIS — R293 Abnormal posture: Secondary | ICD-10-CM

## 2018-07-20 DIAGNOSIS — M6281 Muscle weakness (generalized): Secondary | ICD-10-CM | POA: Diagnosis not present

## 2018-07-20 DIAGNOSIS — R29818 Other symptoms and signs involving the nervous system: Secondary | ICD-10-CM | POA: Diagnosis not present

## 2018-07-20 DIAGNOSIS — R29898 Other symptoms and signs involving the musculoskeletal system: Secondary | ICD-10-CM | POA: Diagnosis not present

## 2018-07-20 DIAGNOSIS — M25622 Stiffness of left elbow, not elsewhere classified: Secondary | ICD-10-CM

## 2018-07-20 DIAGNOSIS — M25522 Pain in left elbow: Secondary | ICD-10-CM

## 2018-07-20 DIAGNOSIS — M25662 Stiffness of left knee, not elsewhere classified: Secondary | ICD-10-CM | POA: Diagnosis not present

## 2018-07-20 DIAGNOSIS — M79605 Pain in left leg: Secondary | ICD-10-CM | POA: Diagnosis not present
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
## 2018-07-22 ENCOUNTER — Encounter: Payer: Self-pay | Admitting: Physical Medicine & Rehabilitation

## 2018-07-22 ENCOUNTER — Encounter: Payer: Medicare Other | Attending: Physical Medicine & Rehabilitation | Admitting: Physical Medicine & Rehabilitation

## 2018-07-22 VITALS — BP 108/72 | HR 69 | Ht 66.0 in | Wt 160.0 lb

## 2018-07-22 DIAGNOSIS — Z87891 Personal history of nicotine dependence: Secondary | ICD-10-CM | POA: Insufficient documentation

## 2018-07-22 DIAGNOSIS — Z90710 Acquired absence of both cervix and uterus: Secondary | ICD-10-CM | POA: Insufficient documentation

## 2018-07-22 DIAGNOSIS — G8250 Quadriplegia, unspecified: Secondary | ICD-10-CM

## 2018-07-22 DIAGNOSIS — S42402S Unspecified fracture of lower end of left humerus, sequela: Secondary | ICD-10-CM | POA: Diagnosis not present

## 2018-07-22 DIAGNOSIS — Z87820 Personal history of traumatic brain injury: Secondary | ICD-10-CM | POA: Diagnosis not present

## 2018-07-22 DIAGNOSIS — K5900 Constipation, unspecified: Secondary | ICD-10-CM | POA: Insufficient documentation

## 2018-07-22 DIAGNOSIS — B373 Candidiasis of vulva and vagina: Secondary | ICD-10-CM | POA: Diagnosis not present

## 2018-07-22 NOTE — Patient Instructions (Signed)
PLEASE FEEL FREE TO CALL OUR OFFICE WITH ANY PROBLEMS OR QUESTIONS ([phone])      

## 2018-07-22 NOTE — Progress Notes (Signed)
Subjective:    Patient ID: [name], female    DOB: [birth year], 37 y.o.   MRN: [id number]  HPI  [name] is here in follow up of her spastic tetraplegia.  We performed Botox injections about 2 months ago with some improvement in her shoulder range of motion and elbow initially.  Mother states the elbow was much improved until therapy began performing some range of motion exercises at her left wrist.  Since that time she seems to have retreated backwards and does not allow the elbow to be arranged any further.  She is moving her wrist and fingers better and is able to grasp using her left hand.  She is in the process of wheelchair evaluation with physical therapy at [facility] neuro rehab.   Pain Inventory Average Pain 2 Pain Right Now 1 My pain is dull  In the last 24 hours, has pain interfered with the following? General activity 1 Relation with others 0 Enjoyment of life 1 What TIME of day is your pain at its worst? evening Sleep (in general) Good  Pain is worse with: bending, sitting, inactivity and some activites Pain improves with: rest, therapy/exercise and medication Relief from Meds: 5  Mobility ability to climb steps?  no do you drive?  no  Function disabled: date disabled 2015 I need assistance with the following:  feeding, dressing, bathing, toileting, meal prep, household duties and shopping  Neuro/Psych confusion  Prior Studies Any changes since last visit?  no  Physicians involved in your care Any changes since last visit?  no   No family history on file. Social History   Socioeconomic History  . Marital status: Divorced    Spouse name: Not on file  . Number of children: Not on file  . Years of education: Not on file  . Highest education level: Not on file  Occupational History  . Not on file  Social Needs  . Financial resource strain: Not on file  . Food insecurity:    Worry: Not on file    Inability: Not on file  . Transportation needs:      Medical: Not on file    Non-medical: Not on file  Tobacco Use  . Smoking status: Former Smoker    Last attempt to quit: 01/04/2014    Years since quitting: 4.5  . Smokeless tobacco: Never Used  Substance and Sexual Activity  . Alcohol use: No  . Drug use: No  . Sexual activity: Not on file  Lifestyle  . Physical activity:    Days per week: Not on file    Minutes per session: Not on file  . Stress: Not on file  Relationships  . Social connections:    Talks on phone: Not on file    Gets together: Not on file    Attends religious service: Not on file    Active member of club or organization: Not on file    Attends meetings of clubs or organizations: Not on file    Relationship status: Not on file  Other Topics Concern  . Not on file  Social History Narrative  . Not on file   Past Surgical History:  Procedure Laterality Date  . ORIF ELBOW FRACTURE Left 01/04/2014   Procedure: OPEN REDUCTION INTERNAL FIXATION (ORIF) ELBOW/OLECRANON FRACTURE;  Surgeon: Alta Corning, MD;  Location: [place];  Service: Orthopedics;  Laterality: Left;  . ORIF HUMERUS FRACTURE Left 01/04/2014   Procedure: OPEN REDUCTION INTERNAL FIXATION (ORIF) HUMERAL SHAFT FRACTURE;  Surgeon: Alta Corning, MD;  Location: [place];  Service: Orthopedics;  Laterality: Left;  . ORIF RADIAL FRACTURE Left 01/04/2014   Procedure: OPEN REDUCTION INTERNAL FIXATION (ORIF) RADIAL FRACTURE;  Surgeon: Alta Corning, MD;  Location: [place];  Service: Orthopedics;  Laterality: Left;  . ORIF ULNAR FRACTURE Left 01/04/2014   Procedure: OPEN REDUCTION INTERNAL FIXATION (ORIF) ULNAR FRACTURE;  Surgeon: Alta Corning, MD;  Location: [place];  Service: Orthopedics;  Laterality: Left;  . PEG PLACEMENT N/A 01/12/2014   Procedure: PERCUTANEOUS ENDOSCOPIC GASTROSTOMY (PEG) PLACEMENT;  Surgeon: Gwenyth Ober, MD;  Location: [place];  Service: General;  Laterality: N/A;    bedside trach and peg  . PEG TUBE REMOVAL  02/13/2015  . PERCUTANEOUS  TRACHEOSTOMY N/A 01/12/2014   Procedure: PERCUTANEOUS TRACHEOSTOMY;  Surgeon: Gwenyth Ober, MD;  Location: [place];  Service: General;  Laterality: N/A;  BEDSIDE TRACH  . TENDON TRANSFER Left 05/22/2017   Procedure: TRANSFER PROFUNDUS TO SUPERFICIALIS LENGTHENING LEFT FINGERS, PROFUNDUS FUNCTIONAL SUPERFUNTIONALIS WEAK NEURECTOMY ULNAR NERVE WRIST;  Surgeon: Daryll Brod, MD;  Location: [place];  Service: Orthopedics;  Laterality: Left;  . TUBAL LIGATION  2003   Past Medical History:  Diagnosis Date  . Complication of anesthesia    severe crying, hypersensitivity to pain  . GERD (gastroesophageal reflux disease)   . History of traumatic brain injury 01/04/2014  . Hypertension    under control with med., has been on med. since 2015  . Spasticity    legs, feet, left arm, left hand  . Subluxation of left shoulder joint 01/04/2014   ongoing  . Unable to walk    BP 108/72   Pulse 69   Ht 5\' 6"  (1.676 m)   Wt 160 lb (72.6 kg)   LMP 07/13/2018   SpO2 95%   BMI 25.82 kg/m   Opioid Risk Score:   Fall Risk Score:  `1  Depression screen PHQ 2/9  Depression screen PHQ 2/9 12/29/2017  Decreased Interest 0  Down, Depressed, Hopeless 0  PHQ - 2 Score 0  Some recent data might be hidden     Review of Systems  Constitutional: Negative.   HENT: Negative.   Eyes: Negative.   Respiratory: Negative.   Cardiovascular: Negative.   Gastrointestinal: Positive for constipation.  Endocrine: Negative.   Genitourinary: Negative.   Musculoskeletal: Negative.   Skin: Negative.   Allergic/Immunologic: Negative.   Neurological: Negative.   Hematological: Negative.   Psychiatric/Behavioral: Positive for confusion.  All other systems reviewed and are negative.      Objective:   Physical Exam  3.4 left biceps/BR,  2-3/4 left pec major/minor,1/4 finger and wrist flexors. 4/4 left gastroc/soleus. RLE: 3/4 right hamstringwith increased hip flexor tightness and external rotation  of the leg..4/4 right gastroc/tibialis posterior--in severe equinovarus position withknee flexion too---tone unchanged.   Patient often resists movement voluntarily as well which only exacerbates pain and range of motion deficits.Speechremains relatively clear..improved attention. Still disinhibited. Musc: pain with palpation around ulnar/humerus.  .General: No acute distress HEENT: EOMI, oral membranes moist Cards: reg rate  Chest: normal effort Abdomen: Soft, NT, ND Skin: dry, intact Extremities: no edema        Assessment & Plan:  1.TBI with spastic tetraplegia:Continue with HEP. Will pursue -S/Ptendon lengtheningand transfersleft wrist/fingers. -continue outpt therapy     2. This patient also requires a personal care attendant at all times.  3.Repeat botulinum toxinin a month.  400u to left  Biceps and brachioradialis . -  made referral to Dr. Marcelino Scot re: potential surgical options for elbow as I believe her elbow pain is more of an issue than the spasticity itself. 5.  Continue Dantrium at 50mg  bid and 150mg  qhs---unclear of dosing6. Don't recommend sublux sling due to her shoulder/pec tightness.  7. Continue tegretol to 300mg  tid for agitation/ mood lability--may be off of this now 8. Constipation: Continue regimen as described 9.Skin:nystatinpowder/ barrier cream prn.Recommend dermatology follow-up for mole 10.Pain relief: Tramadol or ibuprofen as needed  11. Insomnia: seroquel  200mg  qhs.  12. Consider ortho opinion re: heel cords   15 minutes of face to face patient care time were spent during this visit. All questions were encouraged and answered. Follow up next month for botox injections

## 2018-07-23 ENCOUNTER — Ambulatory Visit: Payer: Medicare Other | Admitting: Physical Therapy

## 2018-07-23 ENCOUNTER — Ambulatory Visit: Payer: Medicare Other | Admitting: Occupational Therapy

## 2018-07-27 ENCOUNTER — Ambulatory Visit: Payer: Medicare Other | Admitting: Physical Medicine & Rehabilitation

## 2018-07-27 ENCOUNTER — Encounter: Payer: Self-pay | Admitting: Physical Therapy

## 2018-07-27 ENCOUNTER — Ambulatory Visit: Payer: Medicare Other | Admitting: Occupational Therapy

## 2018-07-27 ENCOUNTER — Encounter: Payer: Self-pay | Admitting: Occupational Therapy

## 2018-07-27 ENCOUNTER — Ambulatory Visit: Payer: Medicare Other | Admitting: Physical Therapy

## 2018-07-27 DIAGNOSIS — M79602 Pain in left arm: Secondary | ICD-10-CM

## 2018-07-27 DIAGNOSIS — R293 Abnormal posture: Secondary | ICD-10-CM

## 2018-07-27 DIAGNOSIS — M25662 Stiffness of left knee, not elsewhere classified: Secondary | ICD-10-CM | POA: Diagnosis not present

## 2018-07-27 DIAGNOSIS — M6281 Muscle weakness (generalized): Secondary | ICD-10-CM

## 2018-07-27 DIAGNOSIS — M25522 Pain in left elbow: Secondary | ICD-10-CM

## 2018-07-27 DIAGNOSIS — M25622 Stiffness of left elbow, not elsewhere classified: Secondary | ICD-10-CM

## 2018-07-27 DIAGNOSIS — R29898 Other symptoms and signs involving the musculoskeletal system: Secondary | ICD-10-CM

## 2018-07-27 DIAGNOSIS — M79605 Pain in left leg: Secondary | ICD-10-CM | POA: Diagnosis not present

## 2018-07-27 DIAGNOSIS — R29818 Other symptoms and signs involving the nervous system: Secondary | ICD-10-CM
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
## 2018-07-27 NOTE — Patient Instructions (Signed)
Tips for Range of Motion  Set aside same time of day for exercise, so it becomes part of the daily routine. All hand holds should be gentle. Move slowly, cautiously and with good control. Once resistance is felt or she reports pain, push no further.  Hold as tolerated for 10-60sec  (start with 10sec and gradually increase hold time) Quality of stretch is more important than quantity.--check positioning Be consistent with program to ensure results. Try to make exercise fun - talk, play music, watch tv to distract her. After stretching, see if she can stretch herself with the other hand or actively perform exercises   Flexion: ROM (Supine)    Position (A) Helper: Hold left arm close to side of trunk. Motion (B) - Lift arm up with thumb facing up (support arm under elbow and keep elbow tucked in close to side). CAUTION: Do not push into shoulder joint. Do not force movement if painful.   Repeat 10 times.  Do 2 sessions per day.    External Rotation: ROM (Sitting)    Position (A) Helper: Hold left arm close to side of trunk, elbow bent. Motion (B) -Bring hand out to side. -Elbow remains in place, wrist straight. CAUTION: Stop at point of tension in muscle or joint. Repeat 10 times. . Do 2 sessions per day. Place hand under shoulder (keep upper arm in line with body, move slowly)    Extension: Stretch - Forearm Extensors (Sitting)    Position Helper: Support left arm at elbow. Motion -Helper straightens elbow fully. CAUTION: Do not force movement if painful. Hold 10-60 seconds as tolerated. Repeat 10 times. Do 2 sessions per day. **Make sure shoulder is low and thumb is up as able.  Do not bend wrist with elbow   Extension: ROM    Position (A) Helper: Stabilize left forearm. Grasp hand under palm. Motion (B) -Lift hand back, bending at wrist.   CAUTION: Do not push into wrist joint. Repeat 10 times.  Do 2 sessions per day.     Forearm: Pronation    Support left elbow to  keep it bent at 90 and at side of trunk. Gently rotate forearm to palm down position. Hold 10 seconds. Repeat 10 times. Do 2 sessions per day. CAUTION: Stretch should be slow and gentle.  Copyright  VHI. All rights reserved.     PIP / DIP Composite Flexion (Passive Stretch)    Gently Bend middle and tip joints of each finger. Hold 10 seconds.  Keep wrist straight as able Repeat 10 times. Do 2 sessions per day.

## 2018-07-30 ENCOUNTER — Ambulatory Visit: Payer: Medicare Other | Admitting: Occupational Therapy

## 2018-07-30 ENCOUNTER — Ambulatory Visit: Payer: Medicare Other | Admitting: Physical Therapy

## 2018-08-03 ENCOUNTER — Ambulatory Visit: Payer: Medicare Other | Admitting: Physical Therapy

## 2018-08-03 ENCOUNTER — Ambulatory Visit: Payer: Medicare Other | Admitting: Occupational Therapy

## 2018-08-03 ENCOUNTER — Encounter: Payer: Self-pay | Admitting: Occupational Therapy

## 2018-08-03 DIAGNOSIS — R293 Abnormal posture: Secondary | ICD-10-CM | POA: Diagnosis not present

## 2018-08-03 DIAGNOSIS — M25622 Stiffness of left elbow, not elsewhere classified: Secondary | ICD-10-CM

## 2018-08-03 DIAGNOSIS — R29818 Other symptoms and signs involving the nervous system: Secondary | ICD-10-CM | POA: Diagnosis not present

## 2018-08-03 DIAGNOSIS — R29898 Other symptoms and signs involving the musculoskeletal system: Secondary | ICD-10-CM

## 2018-08-03 DIAGNOSIS — M6281 Muscle weakness (generalized): Secondary | ICD-10-CM | POA: Diagnosis not present

## 2018-08-03 DIAGNOSIS — M79605 Pain in left leg: Secondary | ICD-10-CM | POA: Diagnosis not present

## 2018-08-03 DIAGNOSIS — M25662 Stiffness of left knee, not elsewhere classified: Secondary | ICD-10-CM | POA: Diagnosis not present

## 2018-08-03 DIAGNOSIS — R41844 Frontal lobe and executive function deficit: Secondary | ICD-10-CM

## 2018-08-03 DIAGNOSIS — M79602 Pain in left arm: Secondary | ICD-10-CM

## 2018-08-03 NOTE — Patient Instructions (Signed)
When To Wear Your Splint Where your splint according to your therapist/physician instructions. (alternate with elbow splint) Currently [name] is wearing her splint 1 time per day, for 45 minutes.   Week of August 26:  Wear 2 times per day, 45 minutes each time Week of 9/2:  Wear 2 times per day, 1 hour each time Week of 9/9: Wear 2 times per day, 1 hour, 15 minutes Week of 9/16:  Wear 2 times per day 1 hour, 30 minutes Week of 9/23/:  Wear 2 times per day, 2 hours.   Week of 9/30:  Wear 2 times per day, 2 hours and 30 minutes  From this point on try and wear  2 times per day,2 and half to 3 hours each time.
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
## 2018-08-04 ENCOUNTER — Encounter: Payer: Self-pay | Admitting: Physical Therapy
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
## 2018-08-04 NOTE — Patient Instructions (Signed)
Things to work on at home with [place]:   1: While lying in bed, have [place] work on bending left knee and moving knee from bed towards right leg. Let her do as much as she can and then assist and provide light pressure for a light stretch. Let her guide you with how much pressure you can apply. Hold for 30 secs and do 3 times. Do in the morning and at night.   2: While lying in bed, have [place] work on rotating right knee in and then pressing it down as far as she can. Again apply a little pressure above her knee as she can tolerate. Hold for 30 secs. Do 3 times. Do in the morning and in the afternoon.   3: Help [place]and[place] get onto her R side (ger her most of the way) and then see if she can use her trunk to assist in getting all the onto R side. Have her slowly go back to starting position (on side, not all the way back to back). Do 10 reps. Morning and night.  While on her Right side perform the following exercises with the Left leg: 10 repetitions of bringing L knee towards nose (bend knee and hip).  Two pillows between knees and have [place] rotate legs in to squeeze pillows.  After exercises provide a little stretch to these two motions (knee to chest and bringing L thigh down closer to R thigh)  4: Help [place] get onto her L side and have her either use your hand to gentle pull upward (trying to use stomach muscles and not all hand) or try and use trapeze bar if you can get it attached. Do 10 lifts. Morning and night.   While on her Left side also perform 10 repetitions of pillow squeezes and then stretch Right leg down closer to Left leg.  5: Sit bed up slightly (approx 45 degrees) and have her try to lift off back of bed using stomach muscles and not right hand. Adjust as you need to, but don't sit bed all the way up. Do 10 times. Morning and night.   6:  Rolling to the left using R arm to punch across body, use head also.  Take rail down and have mom or dad  stand on left side.  Rolling to the right, assist with left leg (have her bend and take towards the right) and you guys assist by using the pad.  Do 4 rolls in each direction x 2 times per day.

## 2018-08-06 ENCOUNTER — Encounter: Payer: Self-pay | Admitting: Physical Therapy

## 2018-08-06 ENCOUNTER — Ambulatory Visit: Payer: Medicare Other | Admitting: Physical Therapy

## 2018-08-06 ENCOUNTER — Ambulatory Visit: Payer: Medicare Other | Admitting: Occupational Therapy

## 2018-08-06 ENCOUNTER — Encounter: Payer: Self-pay | Admitting: Occupational Therapy

## 2018-08-06 DIAGNOSIS — G8929 Other chronic pain: Secondary | ICD-10-CM

## 2018-08-06 DIAGNOSIS — M6281 Muscle weakness (generalized): Secondary | ICD-10-CM

## 2018-08-06 DIAGNOSIS — R293 Abnormal posture: Secondary | ICD-10-CM | POA: Diagnosis not present

## 2018-08-06 DIAGNOSIS — M79602 Pain in left arm: Secondary | ICD-10-CM

## 2018-08-06 DIAGNOSIS — R41844 Frontal lobe and executive function deficit: Secondary | ICD-10-CM

## 2018-08-06 DIAGNOSIS — R29818 Other symptoms and signs involving the nervous system: Secondary | ICD-10-CM

## 2018-08-06 DIAGNOSIS — R29898 Other symptoms and signs involving the musculoskeletal system: Secondary | ICD-10-CM | POA: Diagnosis not present

## 2018-08-06 DIAGNOSIS — M25662 Stiffness of left knee, not elsewhere classified: Secondary | ICD-10-CM

## 2018-08-06 DIAGNOSIS — M25522 Pain in left elbow: Secondary | ICD-10-CM

## 2018-08-06 DIAGNOSIS — M79605 Pain in left leg: Secondary | ICD-10-CM

## 2018-08-06 DIAGNOSIS — M25512 Pain in left shoulder: Secondary | ICD-10-CM

## 2018-08-06 DIAGNOSIS — M25622 Stiffness of left elbow, not elsewhere classified: Secondary | ICD-10-CM
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
## 2018-08-06 NOTE — Patient Instructions (Signed)
Things to work on at home with [place]:    1: While lying in bed, have [place] work on bending left knee and moving knee from bed towards right leg.  Let her do as much as she can and then assist and provide light pressure for a light stretch.  Let her guide you with how much pressure you can apply.  Hold for 30 secs and do 3 times.  Do in the morning and at night.     2:  While lying in bed, have [place] [facility] work on rotating right knee in and then pressing it down as far as she can.  Again apply a little pressure above her knee as she can tolerate.  Hold for 30 secs.  Do 3 times.  Do in the morning and in the afternoon.     3:  Help [place] get onto her R side (ger her most of the way) and then see if she can use her trunk to assist in getting all the onto R side.  Have her slowly go back to starting position (on side, not all the way back to back).  Do 10 reps.  Morning and night.   While on her Right side perform the following exercises with the Left leg: 10 repetitions of bringing L knee towards nose (bend knee and hip).  Two pillows between knees and have [place] rotate legs in to squeeze pillows.  After exercises provide a little stretch to these two motions (knee to chest and bringing L thigh down closer to R thigh)   4:  Help Arilynn get onto her L side and have her either use your hand to gentle pull upward (trying to use stomach muscles and not all hand) or try and use trapeze bar if you can get it attached.  Do 10 lifts.  Morning and night.    While on her Left side also perform 10 repetitions of pillow squeezes and then stretch Right leg down closer to Left leg.    5:  Sit bed up slightly (approx 45 degrees) and have her try to lift off back of bed using stomach muscles and not right hand.  Adjust as you need to, but don't sit bed all the way up.  Do 10 times.  Morning and night.     6:  Rolling to the left using R arm to punch across body, use head also.  Take rail down and have mom or dad  stand on left side.  Rolling to the right, assist with left leg (have her bend and take towards the right) and you guys assist by using the pad.  Do 4 rolls in each direction x 2 times per day.   7. Try to increase your tolerance sitting in your chair with both leg rests lowered. Lower leg rests within a tolerable range for 5-10 min. You can increase the time as tolerance increases over time.   8. While sitting in chair with leg rests lowered, place fold pillow and place between thighs and squeeze pillow together. Hold squeeze for approximately 5 seconds and then release. Perform 1x10 reps with 5 second holds.  9. While sitting in chair with leg rests lowered, alternate leg lifts for 2x10 reps. It will look like you are marching in a seated position.   10. While sitting on the edge of your bed, place two pillows stacked to your right side and come down on your R forearm to stretch the left side of your upper  body. Try to hold for 30 seconds before returning upright. Perform 3x 30 seconds once in the morning and once at night.
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
## 2018-08-14 ENCOUNTER — Other Ambulatory Visit: Payer: Self-pay | Admitting: Physical Medicine & Rehabilitation

## 2018-08-17 ENCOUNTER — Ambulatory Visit: Payer: Medicare Other | Admitting: Physical Therapy

## 2018-08-18 ENCOUNTER — Ambulatory Visit: Payer: Medicare Other | Admitting: Rehabilitation

## 2018-08-21 ENCOUNTER — Ambulatory Visit: Payer: Medicare Other | Attending: Physical Medicine & Rehabilitation | Admitting: Rehabilitation

## 2018-08-21 ENCOUNTER — Encounter: Payer: Self-pay | Admitting: Rehabilitation

## 2018-08-21 DIAGNOSIS — G8250 Quadriplegia, unspecified: Secondary | ICD-10-CM | POA: Diagnosis not present

## 2018-08-21 DIAGNOSIS — M25662 Stiffness of left knee, not elsewhere classified: Secondary | ICD-10-CM | POA: Diagnosis not present

## 2018-08-21 DIAGNOSIS — R29818 Other symptoms and signs involving the nervous system: Secondary | ICD-10-CM | POA: Diagnosis not present

## 2018-08-21 DIAGNOSIS — M6281 Muscle weakness (generalized): Secondary | ICD-10-CM | POA: Insufficient documentation

## 2018-08-21 DIAGNOSIS — M79605 Pain in left leg: Secondary | ICD-10-CM | POA: Insufficient documentation

## 2018-08-21 DIAGNOSIS — R293 Abnormal posture: Secondary | ICD-10-CM | POA: Diagnosis not present
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
## 2018-08-24 ENCOUNTER — Encounter: Payer: Self-pay | Admitting: Physical Therapy

## 2018-08-24 ENCOUNTER — Ambulatory Visit: Payer: Medicare Other | Admitting: Physical Therapy

## 2018-08-24 DIAGNOSIS — R293 Abnormal posture: Secondary | ICD-10-CM | POA: Diagnosis not present

## 2018-08-24 DIAGNOSIS — M79605 Pain in left leg: Secondary | ICD-10-CM | POA: Diagnosis not present

## 2018-08-24 DIAGNOSIS — R29818 Other symptoms and signs involving the nervous system: Secondary | ICD-10-CM | POA: Diagnosis not present

## 2018-08-24 DIAGNOSIS — M25662 Stiffness of left knee, not elsewhere classified: Secondary | ICD-10-CM

## 2018-08-24 DIAGNOSIS — M6281 Muscle weakness (generalized): Secondary | ICD-10-CM

## 2018-08-24 DIAGNOSIS — G8250 Quadriplegia, unspecified: Secondary | ICD-10-CM | POA: Diagnosis not present

## 2018-08-25 ENCOUNTER — Encounter: Payer: Self-pay | Admitting: Physical Medicine & Rehabilitation

## 2018-08-25 ENCOUNTER — Encounter: Payer: Medicare Other | Attending: Physical Medicine & Rehabilitation | Admitting: Physical Medicine & Rehabilitation

## 2018-08-25 VITALS — BP 118/82 | HR 60 | Ht 66.0 in | Wt 158.0 lb

## 2018-08-25 DIAGNOSIS — M79602 Pain in left arm: Secondary | ICD-10-CM

## 2018-08-25 DIAGNOSIS — M25512 Pain in left shoulder: Secondary | ICD-10-CM | POA: Diagnosis not present

## 2018-08-25 DIAGNOSIS — Z87820 Personal history of traumatic brain injury: Secondary | ICD-10-CM

## 2018-08-25 DIAGNOSIS — Z90710 Acquired absence of both cervix and uterus: Secondary | ICD-10-CM | POA: Insufficient documentation

## 2018-08-25 DIAGNOSIS — K5900 Constipation, unspecified: Secondary | ICD-10-CM | POA: Insufficient documentation

## 2018-08-25 DIAGNOSIS — B373 Candidiasis of vulva and vagina: Secondary | ICD-10-CM | POA: Diagnosis not present

## 2018-08-25 DIAGNOSIS — S43002S Unspecified subluxation of left shoulder joint, sequela: Secondary | ICD-10-CM | POA: Diagnosis not present

## 2018-08-25 DIAGNOSIS — M25522 Pain in left elbow: Secondary | ICD-10-CM | POA: Diagnosis not present

## 2018-08-25 DIAGNOSIS — Z87891 Personal history of nicotine dependence: Secondary | ICD-10-CM | POA: Insufficient documentation

## 2018-08-25 DIAGNOSIS — G8929 Other chronic pain: Secondary | ICD-10-CM | POA: Diagnosis not present

## 2018-08-25 DIAGNOSIS — G8250 Quadriplegia, unspecified: Secondary | ICD-10-CM | POA: Insufficient documentation

## 2018-08-25 NOTE — Progress Notes (Signed)
Botox Injection for spasticity using needle EMG guidance Indication: Spastic tetraplegia (HCC)  History of traumatic brain injury   Dilution: 100 Units/ml        Total Units Injected: 400 Indication: Severe spasticity which interferes with ADL,mobility and/or  hygiene and is unresponsive to medication management and other conservative care Informed consent was obtained after describing risks and benefits of the procedure with the patient. This includes bleeding, bruising, infection, excessive weakness, or medication side effects. A REMS form is on file and signed.  Needle: 67mm injectable monopolar needle electrode  Number of units per muscle Pectoralis Major 0 units Pectoralis Minor 0 units Biceps 200 units Brachioradialis 200 units FCR 0 units FCU 0 units FDS 0 units FDP 0 units FPL 0 units Pronator Teres 0 units Pronator Quadratus 0 units  All injections were done after obtaining appropriate EMG activity and after negative drawback for blood. The patient tolerated the procedure well. Post procedure instructions were given. Return in about 3 months (around 11/24/2018).   Given ongoing left shoulder and right elbow pain, have ordered CT to further assess integrity

## 2018-08-25 NOTE — Patient Instructions (Signed)
PLEASE FEEL FREE TO CALL OUR OFFICE WITH ANY PROBLEMS OR QUESTIONS ([phone])      
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
## 2018-08-27 ENCOUNTER — Ambulatory Visit: Payer: Medicare Other | Admitting: Physical Therapy

## 2018-08-28 ENCOUNTER — Encounter: Payer: Self-pay | Admitting: Physical Therapy

## 2018-08-28 ENCOUNTER — Ambulatory Visit: Payer: Medicare Other | Admitting: Physical Therapy

## 2018-08-28 DIAGNOSIS — M6281 Muscle weakness (generalized): Secondary | ICD-10-CM | POA: Diagnosis not present

## 2018-08-28 DIAGNOSIS — R29818 Other symptoms and signs involving the nervous system: Secondary | ICD-10-CM

## 2018-08-28 DIAGNOSIS — R293 Abnormal posture: Secondary | ICD-10-CM | POA: Diagnosis not present

## 2018-08-28 DIAGNOSIS — M79605 Pain in left leg: Secondary | ICD-10-CM | POA: Diagnosis not present

## 2018-08-28 DIAGNOSIS — G8250 Quadriplegia, unspecified: Secondary | ICD-10-CM | POA: Diagnosis not present

## 2018-08-28 DIAGNOSIS — M25662 Stiffness of left knee, not elsewhere classified: Secondary | ICD-10-CM
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
## 2018-09-01 ENCOUNTER — Ambulatory Visit: Payer: Medicare Other | Admitting: Rehabilitation

## 2018-09-01 ENCOUNTER — Encounter: Payer: Self-pay | Admitting: Rehabilitation

## 2018-09-01 DIAGNOSIS — M79605 Pain in left leg: Secondary | ICD-10-CM

## 2018-09-01 DIAGNOSIS — M25662 Stiffness of left knee, not elsewhere classified: Secondary | ICD-10-CM | POA: Diagnosis not present

## 2018-09-01 DIAGNOSIS — R29818 Other symptoms and signs involving the nervous system: Secondary | ICD-10-CM | POA: Diagnosis not present

## 2018-09-01 DIAGNOSIS — M6281 Muscle weakness (generalized): Secondary | ICD-10-CM | POA: Diagnosis not present

## 2018-09-01 DIAGNOSIS — R293 Abnormal posture: Secondary | ICD-10-CM

## 2018-09-01 DIAGNOSIS — G8250 Quadriplegia, unspecified: Secondary | ICD-10-CM | POA: Diagnosis not present
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
## 2018-09-03 ENCOUNTER — Ambulatory Visit: Payer: Medicare Other | Admitting: Rehabilitation

## 2018-09-03 ENCOUNTER — Encounter: Payer: Self-pay | Admitting: Rehabilitation

## 2018-09-03 DIAGNOSIS — R293 Abnormal posture: Secondary | ICD-10-CM

## 2018-09-03 DIAGNOSIS — M25662 Stiffness of left knee, not elsewhere classified: Secondary | ICD-10-CM | POA: Diagnosis not present

## 2018-09-03 DIAGNOSIS — M6281 Muscle weakness (generalized): Secondary | ICD-10-CM

## 2018-09-03 DIAGNOSIS — R29818 Other symptoms and signs involving the nervous system: Secondary | ICD-10-CM

## 2018-09-03 DIAGNOSIS — M79605 Pain in left leg: Secondary | ICD-10-CM | POA: Diagnosis not present

## 2018-09-03 DIAGNOSIS — G8250 Quadriplegia, unspecified: Secondary | ICD-10-CM | POA: Diagnosis not present
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
## 2018-09-07 ENCOUNTER — Other Ambulatory Visit: Payer: Self-pay | Admitting: Physical Medicine & Rehabilitation

## 2018-09-08 ENCOUNTER — Telehealth: Payer: Self-pay

## 2018-09-08 NOTE — Telephone Encounter (Signed)
Tonya from [employer] called stating she is waiting on a medical necessity for a manual wheelchair for pt. Faxed over on 09/01/18. She is checking on the status.

## 2018-09-08 NOTE — Telephone Encounter (Signed)
Spoke to [employer] and she states she sent the fax and she spoke to someone at [employer] to let them know.

## 2018-09-10 ENCOUNTER — Other Ambulatory Visit: Payer: Self-pay | Admitting: Physical Medicine & Rehabilitation

## 2018-09-10 NOTE — Telephone Encounter (Signed)
meds refilled 

## 2018-09-10 NOTE — Telephone Encounter (Signed)
Recieved electronic medication refill request for lorazepam and diclofenac tabs.  No mention of these medications any the last few notes, unsure to refill  Please advise

## 2018-09-10 NOTE — Telephone Encounter (Signed)
Accidentally forwarded to wrong provider, sent to correct one.

## 2018-09-14 ENCOUNTER — Encounter: Payer: Medicare Other | Admitting: Occupational Therapy

## 2018-09-15 ENCOUNTER — Ambulatory Visit: Payer: Medicare Other | Attending: Physical Medicine & Rehabilitation | Admitting: Occupational Therapy

## 2018-09-15 ENCOUNTER — Encounter (HOSPITAL_COMMUNITY): Payer: Self-pay | Admitting: Specialist

## 2018-09-15 DIAGNOSIS — M6249 Contracture of muscle, multiple sites: Secondary | ICD-10-CM | POA: Diagnosis not present

## 2018-09-15 DIAGNOSIS — M25662 Stiffness of left knee, not elsewhere classified: Secondary | ICD-10-CM

## 2018-09-15 DIAGNOSIS — M25622 Stiffness of left elbow, not elsewhere classified: Secondary | ICD-10-CM | POA: Insufficient documentation

## 2018-09-15 DIAGNOSIS — R29818 Other symptoms and signs involving the nervous system: Secondary | ICD-10-CM | POA: Diagnosis not present

## 2018-09-15 DIAGNOSIS — M79602 Pain in left arm: Secondary | ICD-10-CM | POA: Insufficient documentation

## 2018-09-15 DIAGNOSIS — R29898 Other symptoms and signs involving the musculoskeletal system: Secondary | ICD-10-CM | POA: Insufficient documentation

## 2018-09-15 DIAGNOSIS — M6281 Muscle weakness (generalized): Secondary | ICD-10-CM | POA: Diagnosis not present

## 2018-09-15 DIAGNOSIS — R293 Abnormal posture: Secondary | ICD-10-CM

## 2018-09-15 DIAGNOSIS — M25522 Pain in left elbow: Secondary | ICD-10-CM | POA: Insufficient documentation

## 2018-09-15 DIAGNOSIS — R41844 Frontal lobe and executive function deficit: Secondary | ICD-10-CM | POA: Diagnosis not present
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
## 2018-09-16 ENCOUNTER — Encounter: Payer: Medicare Other | Admitting: Occupational Therapy

## 2018-09-16 DIAGNOSIS — K219 Gastro-esophageal reflux disease without esophagitis: Secondary | ICD-10-CM | POA: Diagnosis not present

## 2018-09-16 DIAGNOSIS — G800 Spastic quadriplegic cerebral palsy: Secondary | ICD-10-CM | POA: Diagnosis not present

## 2018-09-16 DIAGNOSIS — I10 Essential (primary) hypertension: Secondary | ICD-10-CM | POA: Diagnosis not present

## 2018-09-16 DIAGNOSIS — Z87891 Personal history of nicotine dependence: Secondary | ICD-10-CM | POA: Diagnosis not present

## 2018-09-16 DIAGNOSIS — Z791 Long term (current) use of non-steroidal anti-inflammatories (NSAID): Secondary | ICD-10-CM | POA: Diagnosis not present

## 2018-09-16 DIAGNOSIS — Z87820 Personal history of traumatic brain injury: Secondary | ICD-10-CM | POA: Diagnosis not present

## 2018-09-21 ENCOUNTER — Other Ambulatory Visit: Payer: Self-pay | Admitting: Physical Medicine & Rehabilitation

## 2018-09-21 DIAGNOSIS — F068 Other specified mental disorders due to known physiological condition: Secondary | ICD-10-CM

## 2018-09-21 DIAGNOSIS — F5104 Psychophysiologic insomnia: Secondary | ICD-10-CM

## 2018-09-21 DIAGNOSIS — S069X0S Unspecified intracranial injury without loss of consciousness, sequela: Principal | ICD-10-CM

## 2018-09-22 ENCOUNTER — Ambulatory Visit: Payer: Medicare Other | Admitting: Occupational Therapy

## 2018-09-22 DIAGNOSIS — R293 Abnormal posture: Secondary | ICD-10-CM

## 2018-09-22 DIAGNOSIS — R41844 Frontal lobe and executive function deficit: Secondary | ICD-10-CM

## 2018-09-22 DIAGNOSIS — M79602 Pain in left arm: Secondary | ICD-10-CM | POA: Diagnosis not present

## 2018-09-22 DIAGNOSIS — M25662 Stiffness of left knee, not elsewhere classified: Secondary | ICD-10-CM | POA: Diagnosis not present

## 2018-09-22 DIAGNOSIS — M6281 Muscle weakness (generalized): Secondary | ICD-10-CM

## 2018-09-22 DIAGNOSIS — M25522 Pain in left elbow: Secondary | ICD-10-CM | POA: Diagnosis not present

## 2018-09-22 DIAGNOSIS — R29818 Other symptoms and signs involving the nervous system: Secondary | ICD-10-CM

## 2018-09-22 DIAGNOSIS — R29898 Other symptoms and signs involving the musculoskeletal system: Secondary | ICD-10-CM

## 2018-09-22 DIAGNOSIS — M25622 Stiffness of left elbow, not elsewhere classified: Secondary | ICD-10-CM

## 2018-09-22 NOTE — Patient Instructions (Signed)
  Activities to try with left hand:  1.  Hold cup in left hand.  Use right hand to guide toward mouth.  2.  Use right hand to stretch out left arm along table (to target).  3.  Hold cup in left hand, work on tilting cup up/down, extending wrist.  4.  Hold phone in left hand  5.  Hold mirror in left hand.     6.  Use foam grip on ice cream stick or fork
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
## 2018-09-22 NOTE — Addendum Note (Signed)
Addended by: Vianne Bulls D on: 09/22/2018 01:47 PM   Modules accepted: Orders

## 2018-09-23 ENCOUNTER — Encounter: Payer: Medicare Other | Admitting: Occupational Therapy

## 2018-09-24 ENCOUNTER — Ambulatory Visit: Payer: Medicare Other | Admitting: Occupational Therapy

## 2018-09-24 ENCOUNTER — Encounter: Payer: Self-pay | Admitting: Occupational Therapy

## 2018-09-24 DIAGNOSIS — R293 Abnormal posture: Secondary | ICD-10-CM

## 2018-09-24 DIAGNOSIS — M6281 Muscle weakness (generalized): Secondary | ICD-10-CM | POA: Diagnosis not present

## 2018-09-24 DIAGNOSIS — M79602 Pain in left arm: Secondary | ICD-10-CM

## 2018-09-24 DIAGNOSIS — R29818 Other symptoms and signs involving the nervous system: Secondary | ICD-10-CM | POA: Diagnosis not present

## 2018-09-24 DIAGNOSIS — R41844 Frontal lobe and executive function deficit: Secondary | ICD-10-CM

## 2018-09-24 DIAGNOSIS — M25622 Stiffness of left elbow, not elsewhere classified: Secondary | ICD-10-CM

## 2018-09-24 DIAGNOSIS — M6249 Contracture of muscle, multiple sites: Secondary | ICD-10-CM

## 2018-09-24 DIAGNOSIS — M25522 Pain in left elbow: Secondary | ICD-10-CM | POA: Diagnosis not present

## 2018-09-24 DIAGNOSIS — R29898 Other symptoms and signs involving the musculoskeletal system: Secondary | ICD-10-CM

## 2018-09-24 DIAGNOSIS — M25662 Stiffness of left knee, not elsewhere classified: Secondary | ICD-10-CM | POA: Diagnosis not present
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
## 2018-09-29 ENCOUNTER — Ambulatory Visit: Payer: Medicare Other | Admitting: Occupational Therapy

## 2018-09-29 ENCOUNTER — Encounter: Payer: Self-pay | Admitting: Occupational Therapy

## 2018-09-29 DIAGNOSIS — M79602 Pain in left arm: Secondary | ICD-10-CM

## 2018-09-29 DIAGNOSIS — R29898 Other symptoms and signs involving the musculoskeletal system: Secondary | ICD-10-CM

## 2018-09-29 DIAGNOSIS — M25622 Stiffness of left elbow, not elsewhere classified: Secondary | ICD-10-CM

## 2018-09-29 DIAGNOSIS — R29818 Other symptoms and signs involving the nervous system: Secondary | ICD-10-CM

## 2018-09-29 DIAGNOSIS — R293 Abnormal posture: Secondary | ICD-10-CM | POA: Diagnosis not present

## 2018-09-29 DIAGNOSIS — M25522 Pain in left elbow: Secondary | ICD-10-CM

## 2018-09-29 DIAGNOSIS — M25662 Stiffness of left knee, not elsewhere classified: Secondary | ICD-10-CM | POA: Diagnosis not present

## 2018-09-29 DIAGNOSIS — M6249 Contracture of muscle, multiple sites: Secondary | ICD-10-CM

## 2018-09-29 DIAGNOSIS — R41844 Frontal lobe and executive function deficit: Secondary | ICD-10-CM

## 2018-09-29 DIAGNOSIS — M6281 Muscle weakness (generalized): Secondary | ICD-10-CM
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
## 2018-10-01 ENCOUNTER — Encounter: Payer: Self-pay | Admitting: Occupational Therapy

## 2018-10-01 ENCOUNTER — Ambulatory Visit: Payer: Medicare Other | Admitting: Occupational Therapy

## 2018-10-01 DIAGNOSIS — M25522 Pain in left elbow: Secondary | ICD-10-CM

## 2018-10-01 DIAGNOSIS — M6281 Muscle weakness (generalized): Secondary | ICD-10-CM

## 2018-10-01 DIAGNOSIS — R29818 Other symptoms and signs involving the nervous system: Secondary | ICD-10-CM | POA: Diagnosis not present

## 2018-10-01 DIAGNOSIS — M25622 Stiffness of left elbow, not elsewhere classified: Secondary | ICD-10-CM

## 2018-10-01 DIAGNOSIS — M79602 Pain in left arm: Secondary | ICD-10-CM

## 2018-10-01 DIAGNOSIS — R29898 Other symptoms and signs involving the musculoskeletal system: Secondary | ICD-10-CM

## 2018-10-01 DIAGNOSIS — M25662 Stiffness of left knee, not elsewhere classified: Secondary | ICD-10-CM | POA: Diagnosis not present

## 2018-10-01 DIAGNOSIS — R293 Abnormal posture: Secondary | ICD-10-CM | POA: Diagnosis not present

## 2018-10-01 DIAGNOSIS — M6249 Contracture of muscle, multiple sites: Secondary | ICD-10-CM
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
## 2018-10-06 ENCOUNTER — Encounter: Payer: Self-pay | Admitting: Occupational Therapy

## 2018-10-06 ENCOUNTER — Ambulatory Visit: Payer: Medicare Other | Admitting: Occupational Therapy

## 2018-10-06 DIAGNOSIS — M6281 Muscle weakness (generalized): Secondary | ICD-10-CM | POA: Diagnosis not present

## 2018-10-06 DIAGNOSIS — M79602 Pain in left arm: Secondary | ICD-10-CM | POA: Diagnosis not present

## 2018-10-06 DIAGNOSIS — R41844 Frontal lobe and executive function deficit: Secondary | ICD-10-CM

## 2018-10-06 DIAGNOSIS — R29818 Other symptoms and signs involving the nervous system: Secondary | ICD-10-CM

## 2018-10-06 DIAGNOSIS — R293 Abnormal posture: Secondary | ICD-10-CM | POA: Diagnosis not present

## 2018-10-06 DIAGNOSIS — M25662 Stiffness of left knee, not elsewhere classified: Secondary | ICD-10-CM | POA: Diagnosis not present

## 2018-10-06 DIAGNOSIS — M25622 Stiffness of left elbow, not elsewhere classified: Secondary | ICD-10-CM

## 2018-10-06 DIAGNOSIS — M25522 Pain in left elbow: Secondary | ICD-10-CM

## 2018-10-06 DIAGNOSIS — M6249 Contracture of muscle, multiple sites: Secondary | ICD-10-CM

## 2018-10-06 DIAGNOSIS — R29898 Other symptoms and signs involving the musculoskeletal system: Secondary | ICD-10-CM
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
## 2018-10-08 ENCOUNTER — Ambulatory Visit: Payer: Medicare Other | Admitting: Occupational Therapy

## 2018-10-12 ENCOUNTER — Ambulatory Visit: Payer: Medicare Other | Attending: Physical Medicine & Rehabilitation | Admitting: Occupational Therapy

## 2018-10-14 ENCOUNTER — Other Ambulatory Visit: Payer: Self-pay | Admitting: Physical Medicine & Rehabilitation

## 2018-10-14 NOTE — Telephone Encounter (Signed)
Okay to refill Propanolol? In reviewing last note it does not indicate for pt to stay on this medication.

## 2018-10-21 DIAGNOSIS — Z87820 Personal history of traumatic brain injury: Secondary | ICD-10-CM | POA: Diagnosis not present

## 2018-10-21 DIAGNOSIS — Z791 Long term (current) use of non-steroidal anti-inflammatories (NSAID): Secondary | ICD-10-CM | POA: Diagnosis not present

## 2018-10-21 DIAGNOSIS — Z79891 Long term (current) use of opiate analgesic: Secondary | ICD-10-CM | POA: Diagnosis not present

## 2018-10-21 DIAGNOSIS — Z87891 Personal history of nicotine dependence: Secondary | ICD-10-CM | POA: Diagnosis not present

## 2018-10-21 DIAGNOSIS — G800 Spastic quadriplegic cerebral palsy: Secondary | ICD-10-CM | POA: Diagnosis not present

## 2018-10-21 DIAGNOSIS — I10 Essential (primary) hypertension: Secondary | ICD-10-CM | POA: Diagnosis not present

## 2018-10-21 DIAGNOSIS — K219 Gastro-esophageal reflux disease without esophagitis: Secondary | ICD-10-CM | POA: Diagnosis not present

## 2018-11-02 DIAGNOSIS — M24412 Recurrent dislocation, left shoulder: Secondary | ICD-10-CM | POA: Diagnosis not present

## 2018-11-10 ENCOUNTER — Encounter: Payer: Medicare Other | Admitting: Physical Medicine & Rehabilitation

## 2018-11-19 ENCOUNTER — Encounter: Payer: Self-pay | Admitting: Occupational Therapy

## 2018-11-19 NOTE — Therapy (Signed)
[facility] [address] [place], [place], [address] Phone: [phone]   Fax:  [phone]  Patient Details  Name: [name] MRN: [id number] Date of Birth: 04-28-1981 Referring Provider:  No ref. provider found  Encounter Date: 11/19/2018  OCCUPATIONAL THERAPY DISCHARGE SUMMARY  Visits from Start of Care: 17  Current functional level related to goals / functional outcomes: OT Short Term Goals - 10/06/18 1831      OT SHORT TERM GOAL #1   Title  Pt/caregiver will be independent with HEP for ROM.--check STGs 07/08/18    Time  4    Period  Weeks    Status  Achieved   08/06/18:  educated and opportunity for questions, mother reports that it is going well; however, may need updates s/p Botox.  09/15/18 met.  Pt refuses at times due to behavior     OT SHORT TERM GOAL #2   Title  Pt/caregiver will be independent with updated splint wear/care (elbow/hand splints) to minimize risk of contractures.    Baseline  10/01/18:  wear continues to be incosistent due to behavior (but splint fit appropriately and caregiver independent with wear/care).    Time  4    Period  Weeks    Status  Partially Met   08/06/18:  met, however, mother reports that due to behavior, pt is refusing to wear elbow splint for last week.  May need to reassess s/p Botox.  09/15/18:  met.  Current splints appropriate, but Pt wears inconsistent at times due to behavior.          OT Long Term Goals - 10/06/18 1829      OT LONG TERM GOAL #1   Title  Pt/caregiver will be independent with selected activities to incr LUE functional use.--check LTGs. 08/07/18    Time  8    Period  Weeks    Status  Achieved   not fully met.  10/01/18:  caregivers verbalize understanding, but pt behavior is barrier to performance at home     OT [place] #2   Title  Pt will demo at least -65* PROM L elbow ext for incr ease/decr pain with ADLs.    Baseline  -70*    Period  Weeks    Status  Not Met   -70*, 08/06/18:  70* after stretching today, 90* prior to stretching.  09/15/18:  -70*.  10/06/18:  -70*     OT LONG TERM GOAL #3   Title  Pt will be be able to hold light object (cell phone, mirror) with LUE at least 50% of the time for functional use (after placing in L hand).    Time  8    Period  Weeks    Status    08/06/18:  not fully met/not consistent due to behavior.    As of 10/06/18, not consistent        Remaining deficits: decr ROM, spasticity, quadriplegia, cognitive/behavior deficits, pain    Education / Equipment: Pt/family instructed in updated splint wear/care (has soft elbow splint and custom resting hand splint, but pt inconsistent with wear due to behavior), HEP for stretching and functional activities for incr LUE functional use and participation.  Pt/family verbalized understanding of education provided. Plan: Patient agrees to discharge.  Patient goals were partially met. Patient is being discharged due to  Reaching maximal rehab potential.  Per mother report and observation, pt inconsistent with cooperation with HEP/activities and splint wear/care due to behavior and pain.  Pt no showed for last scheduled appointment (planned to d/c at that appt) and did not reschedule within >30 days; therefore, she will be discharged at this time.?????      [facility] 11/19/2018, 7:04 PM  [facility] [address] [place], [place], [address] Phone: [phone]   Fax:  [place], OTR/L [facility] [address] [place], [place]  [address] [phone] phone [phone] 11/19/18 7:04 PM

## 2018-11-24 ENCOUNTER — Other Ambulatory Visit: Payer: Self-pay | Admitting: Physical Medicine & Rehabilitation

## 2018-12-07 ENCOUNTER — Telehealth: Payer: Self-pay

## 2018-12-07 NOTE — Telephone Encounter (Signed)
Verbal orders given Lelan Pons for recert

## 2018-12-07 NOTE — Telephone Encounter (Signed)
[name] from [facility] called requesting verbal order recertification Medicaid Cap Program. Is this okay?

## 2018-12-07 NOTE — Telephone Encounter (Signed)
Yes, that's fine 

## 2018-12-18 ENCOUNTER — Other Ambulatory Visit: Payer: Self-pay | Admitting: Physical Medicine & Rehabilitation

## 2018-12-21 ENCOUNTER — Ambulatory Visit: Payer: Medicare Other | Admitting: Physical Medicine & Rehabilitation

## 2018-12-22 ENCOUNTER — Other Ambulatory Visit: Payer: Self-pay

## 2018-12-22 ENCOUNTER — Encounter: Payer: Self-pay | Admitting: Physical Medicine & Rehabilitation

## 2018-12-22 ENCOUNTER — Encounter: Payer: Medicare Other | Attending: Physical Medicine & Rehabilitation | Admitting: Physical Medicine & Rehabilitation

## 2018-12-22 VITALS — BP 120/83 | HR 65 | Wt 158.0 lb

## 2018-12-22 DIAGNOSIS — G8250 Quadriplegia, unspecified: Secondary | ICD-10-CM | POA: Diagnosis not present

## 2018-12-22 DIAGNOSIS — M79602 Pain in left arm: Secondary | ICD-10-CM

## 2018-12-22 DIAGNOSIS — Z5181 Encounter for therapeutic drug level monitoring: Secondary | ICD-10-CM | POA: Diagnosis not present

## 2018-12-22 DIAGNOSIS — Z79891 Long term (current) use of opiate analgesic: Secondary | ICD-10-CM

## 2018-12-22 DIAGNOSIS — G894 Chronic pain syndrome: Secondary | ICD-10-CM

## 2018-12-22 NOTE — Progress Notes (Signed)
Subjective:    Patient ID: [name], female    DOB: 12-03-1981, 38 y.o.   MRN: [id number]  HPI   [name] is here in follow-up of her traumatic brain injury and associated deficits.  Back in September I had aggressively Botox to her elbow flexors and effort to see if we could get her left arm moving.  Unfortunately, she did not see much benefit with the Botox.  Therapy did some work with her as well but it did not provide any long-term results.  She is doing some stretching at home but is self-limiting due to pain and her cognitive behavioral issues.  Mom reports that often she anticipates the elbow hurting and starts crying out even before stretching starts.  She is taking some ibuprofen sometimes before stretches and uses tramadol for breakthrough pain.  She is not using heat regularly.  She said did see trauma orthopedics who looked at the elbow for any structural problems and no abnormalities or changes in plan were recommended along those lines.  Pain Inventory Average Pain 3 Pain Right Now 0 My pain is dull, stabbing and aching  In the last 24 hours, has pain interfered with the following? General activity 0 Relation with others 0 Enjoyment of life 1 What TIME of day is your pain at its worst? evening Sleep (in general) Fair  Pain is worse with: bending Pain improves with: heat/ice and therapy/exercise Relief from Meds: 1  Mobility ability to climb steps?  no do you drive?  no use a wheelchair needs help with transfers  Function disabled: date disabled 01/04/14 I need assistance with the following:  dressing, bathing, toileting, meal prep, household duties and shopping  Neuro/Psych No problems in this area  Prior Studies Any changes since last visit?  no x-rays  Physicians involved in your care Any changes since last visit?  no   No family history on file. Social History   Socioeconomic History  . Marital status: Divorced    Spouse name: Not on file    . Number of children: Not on file  . Years of education: Not on file  . Highest education level: Not on file  Occupational History  . Not on file  Social Needs  . Financial resource strain: Not on file  . Food insecurity:    Worry: Not on file    Inability: Not on file  . Transportation needs:    Medical: Not on file    Non-medical: Not on file  Tobacco Use  . Smoking status: Former Smoker    Last attempt to quit: 01/04/2014    Years since quitting: 4.9  . Smokeless tobacco: Never Used  Substance and Sexual Activity  . Alcohol use: No  . Drug use: No  . Sexual activity: Not on file  Lifestyle  . Physical activity:    Days per week: Not on file    Minutes per session: Not on file  . Stress: Not on file  Relationships  . Social connections:    Talks on phone: Not on file    Gets together: Not on file    Attends religious service: Not on file    Active member of club or organization: Not on file    Attends meetings of clubs or organizations: Not on file    Relationship status: Not on file  Other Topics Concern  . Not on file  Social History Narrative  . Not on file   Past Surgical History:  Procedure  Laterality Date  . ORIF ELBOW FRACTURE Left 01/04/2014   Procedure: OPEN REDUCTION INTERNAL FIXATION (ORIF) ELBOW/OLECRANON FRACTURE;  Surgeon: Alta Corning, MD;  Location: [facility];  Service: Orthopedics;  Laterality: Left;  . ORIF HUMERUS FRACTURE Left 01/04/2014   Procedure: OPEN REDUCTION INTERNAL FIXATION (ORIF) HUMERAL SHAFT FRACTURE;  Surgeon: Alta Corning, MD;  Location: [facility];  Service: Orthopedics;  Laterality: Left;  . ORIF RADIAL FRACTURE Left 01/04/2014   Procedure: OPEN REDUCTION INTERNAL FIXATION (ORIF) RADIAL FRACTURE;  Surgeon: Alta Corning, MD;  Location: [facility];  Service: Orthopedics;  Laterality: Left;  . ORIF ULNAR FRACTURE Left 01/04/2014   Procedure: OPEN REDUCTION INTERNAL FIXATION (ORIF) ULNAR FRACTURE;  Surgeon: Alta Corning, MD;  Location: [facility];   Service: Orthopedics;  Laterality: Left;  . PEG PLACEMENT N/A 01/12/2014   Procedure: PERCUTANEOUS ENDOSCOPIC GASTROSTOMY (PEG) PLACEMENT;  Surgeon: Gwenyth Ober, MD;  Location: [facility];  Service: General;  Laterality: N/A;    bedside trach and peg  . PEG TUBE REMOVAL  02/13/2015  . PERCUTANEOUS TRACHEOSTOMY N/A 01/12/2014   Procedure: PERCUTANEOUS TRACHEOSTOMY;  Surgeon: Gwenyth Ober, MD;  Location: [facility];  Service: General;  Laterality: N/A;  BEDSIDE TRACH  . TENDON TRANSFER Left 05/22/2017   Procedure: TRANSFER PROFUNDUS TO SUPERFICIALIS LENGTHENING LEFT FINGERS, PROFUNDUS FUNCTIONAL SUPERFUNTIONALIS WEAK NEURECTOMY ULNAR NERVE WRIST;  Surgeon: Daryll Brod, MD;  Location: [facility];  Service: Orthopedics;  Laterality: Left;  . TUBAL LIGATION  2003   Past Medical History:  Diagnosis Date  . Complication of anesthesia    severe crying, hypersensitivity to pain  . GERD (gastroesophageal reflux disease)   . History of traumatic brain injury 01/04/2014  . Hypertension    under control with med., has been on med. since 2015  . Spasticity    legs, feet, left arm, left hand  . Subluxation of left shoulder joint 01/04/2014   ongoing  . Unable to walk    BP 120/83   Pulse 65   Wt 158 lb (71.7 kg) Comment: pt reported, in wheelchair  SpO2 95%   BMI 25.50 kg/m   Opioid Risk Score:   Fall Risk Score:  `1  Depression screen PHQ 2/9  Depression screen [facility] 2/9 12/22/2018 12/29/2017  Decreased Interest 0 0  Down, Depressed, Hopeless 0 0  PHQ - 2 Score 0 0  Some recent data might be hidden   Review of Systems  Constitutional: Negative.   HENT: Negative.   Eyes: Negative.   Respiratory: Negative.   Cardiovascular: Negative.   Gastrointestinal: Positive for constipation.  Endocrine: Negative.   Genitourinary: Negative.   Musculoskeletal: Negative.   Skin: Negative.   Allergic/Immunologic: Negative.   Neurological: Negative.   Hematological: Negative.     Psychiatric/Behavioral: Negative.   All other systems reviewed and are negative.      Objective:   Physical Exam     General: No acute distress HEENT: EOMI, oral membranes moist Cards: reg rate  Chest: normal effort Abdomen: Soft, NT, ND Skin: dry, intact Extremities: no edema Tone remains 2 out of 4 at the left PACs.  Left elbow remains fixed in the flexed position but I hesitate calling this tone as she did not react at all to Botox previously.  Its more pain inhibition and a withdrawal response.  And tone in left upper extremity is 1 out of 4.  RLE: 3/4 right hamstringwith increased hip flexor tightness and external rotation of the leg..4/4 right gastroc/tibialis  posterior--in severe equinovarus position withknee flexion too--- exam unchanged.   Cognitively she is near her baseline with.improved attention. Still disinhibited. Musc: pain with palpation around ulnar/humerus.   I asked her tip passively range the left elbow on her own and she was able to accomplish perhaps 5 or 6 degrees of extension from a 90 degree resting position. .       Assessment & Plan:  1.TBI with spastic tetraplegia:Continue with HEP. Will pursue -S/Ptendon lengtheningand transfersleft wrist/fingers. -Continue with home exercise program for wrist  2. This patient also requires a personal care attendant at all times.  3.  Left elbow pain/contracture:No imminent plans for botox. Discussed stretching at length.  Orthopedic surgery had nothing new to offer her regarding her elbow.  Try to discuss with the patient and her parents with the barriers are to her ranging the elbow.  She will have to work through an element of pain.  Discussed techniques to better range the elbow and to pretreat pain.  Ultimately if there are significant cognitive behavioral deficits which are really preventing aggressive exercise.  Needs to pretreat with medication and heat elbow prior to exercise. 5.  Continue  Dantrium at 50mg  bid and 150mg  qhs---unclear of dosing 6. Don't recommend sublux sling due to her shoulder/pec tightness.  7. Continue tegretol to 300mg  tid for agitation/ mood lability--may be off of this now 8. Constipation: Continue regimen as described 9.Skin:nystatinpowder/ barrier cream prn.Recommend dermatology follow-up for mole 10.Pain relief: Tramadol or ibuprofen as needed and prior to stretching. 11. Insomnia: seroquel  200mg  qhs. 12. Consider ortho opinion re: heel cords   47minutes of face to face patient care time were spent during this visit. All questions were encouraged and answered. Follow-up in 3 months

## 2018-12-22 NOTE — Patient Instructions (Signed)
YOU HAVE TO CONTINUE STRETCHING YOUR LEFT ARM DAILY!

## 2018-12-26 LAB — DRUG TOX MONITOR 1 W/CONF, ORAL FLD
AMPHETAMINES: NEGATIVE ng/mL (ref <10)
BENZODIAZEPINES: NEGATIVE ng/mL (ref <0.50)
BUPRENORPHINE: NEGATIVE ng/mL (ref <0.10)
Barbiturates: NEGATIVE ng/mL (ref <10)
COCAINE: NEGATIVE ng/mL (ref <5.0)
Fentanyl: NEGATIVE ng/mL (ref <0.10)
Heroin Metabolite: NEGATIVE ng/mL (ref <1.0)
MARIJUANA: NEGATIVE ng/mL (ref <2.5)
MDMA: NEGATIVE ng/mL (ref <10)
MEPROBAMATE: NEGATIVE ng/mL (ref <2.5)
METHADONE: NEGATIVE ng/mL (ref <5.0)
Nicotine Metabolite: NEGATIVE ng/mL (ref <5.0)
Opiates: NEGATIVE ng/mL (ref <2.5)
Phencyclidine: NEGATIVE ng/mL (ref <10)
TRAMADOL: 49.8 ng/mL — AB (ref <5.0)
Tapentadol: NEGATIVE ng/mL (ref <5.0)
Tramadol: POSITIVE ng/mL — AB (ref <5.0)
ZOLPIDEM: NEGATIVE ng/mL (ref <5.0)

## 2018-12-26 LAB — DRUG TOX ALC METAB W/CON, ORAL FLD: ALCOHOL METABOLITE: NEGATIVE ng/mL (ref <25)

## 2018-12-28 ENCOUNTER — Telehealth: Payer: Self-pay | Admitting: *Deleted

## 2018-12-28 NOTE — Telephone Encounter (Signed)
Oral swab drug screen was consistent for prescribed medications.  ?

## 2019-01-21 ENCOUNTER — Other Ambulatory Visit: Payer: Self-pay | Admitting: Physical Medicine & Rehabilitation

## 2019-02-05 ENCOUNTER — Telehealth: Payer: Self-pay | Admitting: *Deleted

## 2019-02-05 NOTE — Telephone Encounter (Signed)
Cheryl from [facility] called for POC for q other month for catheter maintenance.

## 2019-02-08 NOTE — Telephone Encounter (Signed)
Notified. 

## 2019-02-08 NOTE — Telephone Encounter (Signed)
OK, I assume they will be sending it?

## 2019-02-17 DIAGNOSIS — Z791 Long term (current) use of non-steroidal anti-inflammatories (NSAID): Secondary | ICD-10-CM

## 2019-02-17 DIAGNOSIS — Z79891 Long term (current) use of opiate analgesic: Secondary | ICD-10-CM | POA: Diagnosis not present

## 2019-02-17 DIAGNOSIS — Z87820 Personal history of traumatic brain injury: Secondary | ICD-10-CM | POA: Diagnosis not present

## 2019-02-17 DIAGNOSIS — Z87891 Personal history of nicotine dependence: Secondary | ICD-10-CM

## 2019-02-17 DIAGNOSIS — G800 Spastic quadriplegic cerebral palsy: Secondary | ICD-10-CM | POA: Diagnosis not present

## 2019-02-17 DIAGNOSIS — K219 Gastro-esophageal reflux disease without esophagitis: Secondary | ICD-10-CM | POA: Diagnosis not present

## 2019-02-17 DIAGNOSIS — I10 Essential (primary) hypertension: Secondary | ICD-10-CM | POA: Diagnosis not present

## 2019-03-19 ENCOUNTER — Other Ambulatory Visit: Payer: Self-pay | Admitting: Physical Medicine & Rehabilitation

## 2019-03-23 ENCOUNTER — Ambulatory Visit: Payer: Medicare Other | Admitting: Physical Medicine & Rehabilitation

## 2019-04-06 ENCOUNTER — Telehealth: Payer: Self-pay

## 2019-04-06 NOTE — Telephone Encounter (Signed)
[name] has been notified.

## 2019-04-06 NOTE — Telephone Encounter (Signed)
Leigh Aurora from [facility] called requesting verbal orders to continue seeing pt every 2 months for caps. Is this okay?

## 2019-04-06 NOTE — Telephone Encounter (Signed)
yes

## 2019-05-05 ENCOUNTER — Encounter: Payer: Medicare Other | Admitting: Physical Medicine & Rehabilitation

## 2019-05-10 ENCOUNTER — Other Ambulatory Visit: Payer: Self-pay | Admitting: Physical Medicine & Rehabilitation

## 2019-05-11 DIAGNOSIS — G800 Spastic quadriplegic cerebral palsy: Secondary | ICD-10-CM

## 2019-05-11 DIAGNOSIS — Z791 Long term (current) use of non-steroidal anti-inflammatories (NSAID): Secondary | ICD-10-CM

## 2019-05-11 DIAGNOSIS — I10 Essential (primary) hypertension: Secondary | ICD-10-CM

## 2019-05-11 DIAGNOSIS — Z87820 Personal history of traumatic brain injury: Secondary | ICD-10-CM

## 2019-05-11 DIAGNOSIS — Z87891 Personal history of nicotine dependence: Secondary | ICD-10-CM

## 2019-05-11 DIAGNOSIS — K219 Gastro-esophageal reflux disease without esophagitis: Secondary | ICD-10-CM

## 2019-05-11 DIAGNOSIS — Z79891 Long term (current) use of opiate analgesic: Secondary | ICD-10-CM

## 2019-06-04 ENCOUNTER — Other Ambulatory Visit: Payer: Self-pay | Admitting: Physical Medicine & Rehabilitation

## 2019-06-30 DIAGNOSIS — Z79891 Long term (current) use of opiate analgesic: Secondary | ICD-10-CM | POA: Diagnosis not present

## 2019-06-30 DIAGNOSIS — Z87891 Personal history of nicotine dependence: Secondary | ICD-10-CM | POA: Diagnosis not present

## 2019-06-30 DIAGNOSIS — G800 Spastic quadriplegic cerebral palsy: Secondary | ICD-10-CM | POA: Diagnosis not present

## 2019-06-30 DIAGNOSIS — K219 Gastro-esophageal reflux disease without esophagitis: Secondary | ICD-10-CM | POA: Diagnosis not present

## 2019-06-30 DIAGNOSIS — Z87820 Personal history of traumatic brain injury: Secondary | ICD-10-CM | POA: Diagnosis not present

## 2019-06-30 DIAGNOSIS — Z791 Long term (current) use of non-steroidal anti-inflammatories (NSAID): Secondary | ICD-10-CM | POA: Diagnosis not present

## 2019-06-30 DIAGNOSIS — I10 Essential (primary) hypertension: Secondary | ICD-10-CM | POA: Diagnosis not present

## 2019-07-06 ENCOUNTER — Encounter: Payer: Medicare Other | Admitting: Physical Medicine & Rehabilitation

## 2019-07-09 ENCOUNTER — Other Ambulatory Visit: Payer: Self-pay | Admitting: *Deleted

## 2019-07-09 ENCOUNTER — Other Ambulatory Visit: Payer: Self-pay | Admitting: Physical Medicine & Rehabilitation

## 2019-07-09 MED ORDER — LORAZEPAM 0.5 MG PO TABS: ORAL_TABLET | ORAL | 3 refills | Status: DC

## 2019-07-21 ENCOUNTER — Encounter: Payer: Self-pay | Admitting: Physical Medicine & Rehabilitation

## 2019-07-21 ENCOUNTER — Encounter: Payer: Medicare Other | Attending: Physical Medicine & Rehabilitation | Admitting: Physical Medicine & Rehabilitation

## 2019-07-21 ENCOUNTER — Other Ambulatory Visit: Payer: Self-pay

## 2019-07-21 VITALS — BP 134/89 | HR 60 | Temp 98.4°F | Ht 66.0 in | Wt 152.0 lb

## 2019-07-21 DIAGNOSIS — G8250 Quadriplegia, unspecified: Secondary | ICD-10-CM | POA: Insufficient documentation

## 2019-07-21 DIAGNOSIS — Z87820 Personal history of traumatic brain injury: Secondary | ICD-10-CM | POA: Diagnosis not present

## 2019-07-21 DIAGNOSIS — Z87828 Personal history of other (healed) physical injury and trauma: Secondary | ICD-10-CM | POA: Diagnosis not present

## 2019-07-21 NOTE — Patient Instructions (Signed)
PLEASE FEEL FREE TO CALL OUR OFFICE WITH ANY PROBLEMS OR QUESTIONS ([phone])      

## 2019-07-21 NOTE — Progress Notes (Signed)
Subjective:    Patient ID: [name], female    DOB: [birth year], 38 y.o.   MRN: [id number]  HPI   [name] is here in follow up of her TBI an associated spastic tetraplegia and cognitive deficits. She's had some issues with some dry skin. Family had questions about treatment.   Her family is working with visitation and living arrangements. She is primarily living with her mother now. She just came off a 5 month quarantine recently when the psycosocial situation was settled and with COVID restrictions being loosened. Since she's home mom would like HH therapies to assist with mobility and self-care within the home. She is using a w/c for mobility with legs in extension d/t her contractures and spasticity.   Her medications are unchanged.    Pain Inventory Average Pain 2 Pain Right Now 3 My pain is dull and aching  In the last 24 hours, has pain interfered with the following? General activity 2 Relation with others 4 Enjoyment of life 5 What TIME of day is your pain at its worst? evening Sleep (in general) Good  Pain is worse with: inactivity and some activites Pain improves with: medication and . Relief from Meds: 6  Mobility use a wheelchair needs help with transfers  Function disabled: date disabled .  Neuro/Psych trouble walking  Prior Studies Any changes since last visit?  no  Physicians involved in your care Any changes since last visit?  no   No family history on file. Social History   Socioeconomic History  . Marital status: Divorced    Spouse name: Not on file  . Number of children: Not on file  . Years of education: Not on file  . Highest education level: Not on file  Occupational History  . Not on file  Social Needs  . Financial resource strain: Not on file  . Food insecurity    Worry: Not on file    Inability: Not on file  . Transportation needs    Medical: Not on file    Non-medical: Not on file  Tobacco Use  . Smoking status:  Former Smoker    Quit date: 01/04/2014    Years since quitting: 5.5  . Smokeless tobacco: Never Used  Substance and Sexual Activity  . Alcohol use: No  . Drug use: No  . Sexual activity: Not on file  Lifestyle  . Physical activity    Days per week: Not on file    Minutes per session: Not on file  . Stress: Not on file  Relationships  . Social Herbalist on phone: Not on file    Gets together: Not on file    Attends religious service: Not on file    Active member of club or organization: Not on file    Attends meetings of clubs or organizations: Not on file    Relationship status: Not on file  Other Topics Concern  . Not on file  Social History Narrative  . Not on file   Past Surgical History:  Procedure Laterality Date  . ORIF ELBOW FRACTURE Left 01/04/2014   Procedure: OPEN REDUCTION INTERNAL FIXATION (ORIF) ELBOW/OLECRANON FRACTURE;  Surgeon: Alta Corning, MD;  Location: [place];  Service: Orthopedics;  Laterality: Left;  . ORIF HUMERUS FRACTURE Left 01/04/2014   Procedure: OPEN REDUCTION INTERNAL FIXATION (ORIF) HUMERAL SHAFT FRACTURE;  Surgeon: Alta Corning, MD;  Location: [place];  Service: Orthopedics;  Laterality: Left;  . ORIF RADIAL FRACTURE  Left 01/04/2014   Procedure: OPEN REDUCTION INTERNAL FIXATION (ORIF) RADIAL FRACTURE;  Surgeon: Alta Corning, MD;  Location: [place];  Service: Orthopedics;  Laterality: Left;  . ORIF ULNAR FRACTURE Left 01/04/2014   Procedure: OPEN REDUCTION INTERNAL FIXATION (ORIF) ULNAR FRACTURE;  Surgeon: Alta Corning, MD;  Location: [place];  Service: Orthopedics;  Laterality: Left;  . PEG PLACEMENT N/A 01/12/2014   Procedure: PERCUTANEOUS ENDOSCOPIC GASTROSTOMY (PEG) PLACEMENT;  Surgeon: Gwenyth Ober, MD;  Location: [place];  Service: General;  Laterality: N/A;    bedside trach and peg  . PEG TUBE REMOVAL  02/13/2015  . PERCUTANEOUS TRACHEOSTOMY N/A 01/12/2014   Procedure: PERCUTANEOUS TRACHEOSTOMY;  Surgeon: Gwenyth Ober, MD;  Location:  [place];  Service: General;  Laterality: N/A;  BEDSIDE TRACH  . TENDON TRANSFER Left 05/22/2017   Procedure: TRANSFER PROFUNDUS TO SUPERFICIALIS LENGTHENING LEFT FINGERS, PROFUNDUS FUNCTIONAL SUPERFUNTIONALIS WEAK NEURECTOMY ULNAR NERVE WRIST;  Surgeon: Daryll Brod, MD;  Location: [place];  Service: Orthopedics;  Laterality: Left;  . TUBAL LIGATION  2003   Past Medical History:  Diagnosis Date  . Complication of anesthesia    severe crying, hypersensitivity to pain  . GERD (gastroesophageal reflux disease)   . History of traumatic brain injury 01/04/2014  . Hypertension    under control with med., has been on med. since 2015  . Spasticity    legs, feet, left arm, left hand  . Subluxation of left shoulder joint 01/04/2014   ongoing  . Unable to walk    BP 134/89   Pulse 60   Temp 98.4 F (36.9 C)   Ht 5\' 6"  (1.676 m)   Wt 152 lb (68.9 kg)   SpO2 98%   BMI 24.53 kg/m   Opioid Risk Score:   Fall Risk Score:  `1  Depression screen PHQ 2/9  Depression screen [facility] 2/9 12/22/2018 12/29/2017  Decreased Interest 0 0  Down, Depressed, Hopeless 0 0  PHQ - 2 Score 0 0  Some recent data might be hidden     Review of Systems  Constitutional: Negative.   HENT: Negative.   Eyes: Negative.   Respiratory: Negative.   Cardiovascular: Negative.   Gastrointestinal: Positive for constipation.  Endocrine: Negative.   Genitourinary: Negative.   Musculoskeletal: Positive for arthralgias, gait problem and myalgias.  Skin: Negative.   Allergic/Immunologic: Negative.   Hematological: Negative.   Psychiatric/Behavioral: Negative.   All other systems reviewed and are negative.      Objective:   Physical Exam General: No acute distress HEENT: EOMI, oral membranes moist Cards: reg rate  Chest: normal effort Abdomen: Soft, NT, ND Skin: dry, intact Extremities: no edema   Tone remains 2 out of 4 at the left [place].  contracted wrist and shoulder LUE with underlying tone.    RLE: 3/4 right hamstringwith increased hip flexor tightness and external rotation of the leg..4/4 right gastroc/tibialis posterior--in severe equinovarus position withknee flexion too--- exam remains unchanged. Cognitively she is near her baseline with.improved attention. Less disinhibited Musc: pain with attempts at rom of left upper and bilateral LE's. .       Assessment & Plan:  1.TBI with spastic tetraplegia: -ordere HH therapies today. Consider outpt again later this year. W/c eval? -S/Ptendon lengtheningand transfersleft wrist/fingers. -Continue with home exercise program for wrist  2. This patient also requires a personal care attendant at all times.  3.  Left elbow pain/contracture:continue HEP, daily stretching 5.Continue Dantrium at 50mg  bid and 150mg  qhs---unclear  of dosing 6. Don't recommend sublux sling due to her shoulder/pec tightness.  7.  Constipation:Continue regimen as described 9.Skin:.would benefit from exfoliant and moisturizer for dry skin. 10.Pain relief:Tramadol or ibuprofen as needed and prior to stretching. 11. Insomnia: seroquel 200mg  qhs. 12. Consider ortho opinion re: heel cords at some point   80minutes of face to face patient care time were spent during this visit. All questions were encouraged and answered. Follow-up in 4 months

## 2019-07-23 ENCOUNTER — Telehealth: Payer: Self-pay

## 2019-07-23 NOTE — Telephone Encounter (Signed)
[name] with [facility] [phone] called and states you ordered PT OT SLP from [facility] -- they called to set up and MOM states they are using [facility].  [name] states they cannot do PTOT SLP if other DME Company is doing nursing.  Do you care - if not may need another order to [facility] for PT OT SLP.

## 2019-07-23 NOTE — Telephone Encounter (Signed)
I did NOT order from [facility]. I ordered for [facility] (look at orders as they are printed out for external vendor, in this case [facility]). I assume someone sent it to [facility] by accident.

## 2019-07-29 DIAGNOSIS — Z87891 Personal history of nicotine dependence: Secondary | ICD-10-CM | POA: Diagnosis not present

## 2019-07-29 DIAGNOSIS — K219 Gastro-esophageal reflux disease without esophagitis: Secondary | ICD-10-CM | POA: Diagnosis not present

## 2019-07-29 DIAGNOSIS — F800 Phonological disorder: Secondary | ICD-10-CM | POA: Diagnosis not present

## 2019-07-29 DIAGNOSIS — I10 Essential (primary) hypertension: Secondary | ICD-10-CM | POA: Diagnosis not present

## 2019-07-29 DIAGNOSIS — G8250 Quadriplegia, unspecified: Secondary | ICD-10-CM | POA: Diagnosis not present

## 2019-07-29 DIAGNOSIS — G4700 Insomnia, unspecified: Secondary | ICD-10-CM | POA: Diagnosis not present

## 2019-07-29 DIAGNOSIS — S069X0S Unspecified intracranial injury without loss of consciousness, sequela: Secondary | ICD-10-CM | POA: Diagnosis not present

## 2019-08-03 ENCOUNTER — Telehealth: Payer: Self-pay | Admitting: *Deleted

## 2019-08-03 NOTE — Telephone Encounter (Signed)
[name] called from [facility] because [name] was not available last week for her speech eval. She would like to have verbal ok to do it this week. Approval given.

## 2019-08-04 DIAGNOSIS — F800 Phonological disorder: Secondary | ICD-10-CM | POA: Diagnosis not present

## 2019-08-04 DIAGNOSIS — G8250 Quadriplegia, unspecified: Secondary | ICD-10-CM | POA: Diagnosis not present

## 2019-08-04 DIAGNOSIS — S069X0S Unspecified intracranial injury without loss of consciousness, sequela: Secondary | ICD-10-CM | POA: Diagnosis not present

## 2019-08-04 DIAGNOSIS — G4700 Insomnia, unspecified: Secondary | ICD-10-CM | POA: Diagnosis not present

## 2019-08-04 DIAGNOSIS — I10 Essential (primary) hypertension: Secondary | ICD-10-CM | POA: Diagnosis not present

## 2019-08-04 DIAGNOSIS — K219 Gastro-esophageal reflux disease without esophagitis: Secondary | ICD-10-CM

## 2019-08-04 DIAGNOSIS — Z87891 Personal history of nicotine dependence: Secondary | ICD-10-CM

## 2019-08-05 ENCOUNTER — Other Ambulatory Visit: Payer: Self-pay | Admitting: Physical Medicine & Rehabilitation

## 2019-08-05 ENCOUNTER — Telehealth: Payer: Self-pay

## 2019-08-05 DIAGNOSIS — S069X0S Unspecified intracranial injury without loss of consciousness, sequela: Secondary | ICD-10-CM | POA: Diagnosis not present

## 2019-08-05 DIAGNOSIS — F800 Phonological disorder: Secondary | ICD-10-CM | POA: Diagnosis not present

## 2019-08-05 DIAGNOSIS — G8250 Quadriplegia, unspecified: Secondary | ICD-10-CM | POA: Diagnosis not present

## 2019-08-05 DIAGNOSIS — I10 Essential (primary) hypertension: Secondary | ICD-10-CM | POA: Diagnosis not present

## 2019-08-05 DIAGNOSIS — K219 Gastro-esophageal reflux disease without esophagitis: Secondary | ICD-10-CM | POA: Diagnosis not present

## 2019-08-05 DIAGNOSIS — G4700 Insomnia, unspecified: Secondary | ICD-10-CM | POA: Diagnosis not present

## 2019-08-05 NOTE — Telephone Encounter (Signed)
Mallory, OT from [facility] called requesting verbal orders for HHOT 1wk1, 2wk4. Orders approved and given.

## 2019-08-06 ENCOUNTER — Telehealth: Payer: Self-pay

## 2019-08-06 NOTE — Telephone Encounter (Signed)
Cathleen PT/[facility] called requesting verbal orders for HHPT 1wk1, 2wk2, 1wk5. Orders approved and given.

## 2019-08-13 ENCOUNTER — Telehealth: Payer: Self-pay

## 2019-08-13 NOTE — Telephone Encounter (Signed)
Mallary OT from [facility] called stating pt will mess OT visit today at pt request

## 2019-08-16 DIAGNOSIS — K219 Gastro-esophageal reflux disease without esophagitis: Secondary | ICD-10-CM | POA: Diagnosis not present

## 2019-08-16 DIAGNOSIS — S069X0S Unspecified intracranial injury without loss of consciousness, sequela: Secondary | ICD-10-CM | POA: Diagnosis not present

## 2019-08-16 DIAGNOSIS — G8250 Quadriplegia, unspecified: Secondary | ICD-10-CM | POA: Diagnosis not present

## 2019-08-16 DIAGNOSIS — F800 Phonological disorder: Secondary | ICD-10-CM | POA: Diagnosis not present

## 2019-08-16 DIAGNOSIS — I10 Essential (primary) hypertension: Secondary | ICD-10-CM | POA: Diagnosis not present

## 2019-08-16 DIAGNOSIS — G4700 Insomnia, unspecified: Secondary | ICD-10-CM | POA: Diagnosis not present

## 2019-08-18 DIAGNOSIS — S069X0S Unspecified intracranial injury without loss of consciousness, sequela: Secondary | ICD-10-CM | POA: Diagnosis not present

## 2019-08-18 DIAGNOSIS — G4700 Insomnia, unspecified: Secondary | ICD-10-CM | POA: Diagnosis not present

## 2019-08-18 DIAGNOSIS — I10 Essential (primary) hypertension: Secondary | ICD-10-CM | POA: Diagnosis not present

## 2019-08-18 DIAGNOSIS — F800 Phonological disorder: Secondary | ICD-10-CM | POA: Diagnosis not present

## 2019-08-18 DIAGNOSIS — G8250 Quadriplegia, unspecified: Secondary | ICD-10-CM | POA: Diagnosis not present

## 2019-08-18 DIAGNOSIS — K219 Gastro-esophageal reflux disease without esophagitis: Secondary | ICD-10-CM | POA: Diagnosis not present

## 2019-08-19 DIAGNOSIS — K219 Gastro-esophageal reflux disease without esophagitis: Secondary | ICD-10-CM | POA: Diagnosis not present

## 2019-08-19 DIAGNOSIS — S069X0S Unspecified intracranial injury without loss of consciousness, sequela: Secondary | ICD-10-CM | POA: Diagnosis not present

## 2019-08-19 DIAGNOSIS — G8250 Quadriplegia, unspecified: Secondary | ICD-10-CM | POA: Diagnosis not present

## 2019-08-19 DIAGNOSIS — F800 Phonological disorder: Secondary | ICD-10-CM | POA: Diagnosis not present

## 2019-08-19 DIAGNOSIS — I10 Essential (primary) hypertension: Secondary | ICD-10-CM | POA: Diagnosis not present

## 2019-08-19 DIAGNOSIS — G4700 Insomnia, unspecified: Secondary | ICD-10-CM | POA: Diagnosis not present

## 2019-08-21 DIAGNOSIS — F800 Phonological disorder: Secondary | ICD-10-CM | POA: Diagnosis not present

## 2019-08-21 DIAGNOSIS — G4700 Insomnia, unspecified: Secondary | ICD-10-CM | POA: Diagnosis not present

## 2019-08-21 DIAGNOSIS — K219 Gastro-esophageal reflux disease without esophagitis: Secondary | ICD-10-CM | POA: Diagnosis not present

## 2019-08-21 DIAGNOSIS — G8250 Quadriplegia, unspecified: Secondary | ICD-10-CM | POA: Diagnosis not present

## 2019-08-21 DIAGNOSIS — S069X0S Unspecified intracranial injury without loss of consciousness, sequela: Secondary | ICD-10-CM | POA: Diagnosis not present

## 2019-08-21 DIAGNOSIS — I10 Essential (primary) hypertension: Secondary | ICD-10-CM | POA: Diagnosis not present

## 2019-08-23 ENCOUNTER — Telehealth: Payer: Self-pay

## 2019-08-23 DIAGNOSIS — G8250 Quadriplegia, unspecified: Secondary | ICD-10-CM | POA: Diagnosis not present

## 2019-08-23 DIAGNOSIS — G4700 Insomnia, unspecified: Secondary | ICD-10-CM | POA: Diagnosis not present

## 2019-08-23 DIAGNOSIS — F800 Phonological disorder: Secondary | ICD-10-CM | POA: Diagnosis not present

## 2019-08-23 DIAGNOSIS — I10 Essential (primary) hypertension: Secondary | ICD-10-CM | POA: Diagnosis not present

## 2019-08-23 DIAGNOSIS — S069X0S Unspecified intracranial injury without loss of consciousness, sequela: Secondary | ICD-10-CM | POA: Diagnosis not present

## 2019-08-23 DIAGNOSIS — K219 Gastro-esophageal reflux disease without esophagitis: Secondary | ICD-10-CM | POA: Diagnosis not present

## 2019-08-23 NOTE — Telephone Encounter (Signed)
[name], ST from [facility] called requesting verbal orders for 1wk4. Orders approved and given per Dr. [name] note

## 2019-08-25 ENCOUNTER — Other Ambulatory Visit: Payer: Self-pay | Admitting: Physical Medicine & Rehabilitation

## 2019-08-25 DIAGNOSIS — F5104 Psychophysiologic insomnia: Secondary | ICD-10-CM

## 2019-08-25 DIAGNOSIS — S069X0S Unspecified intracranial injury without loss of consciousness, sequela: Secondary | ICD-10-CM | POA: Diagnosis not present

## 2019-08-25 DIAGNOSIS — G4700 Insomnia, unspecified: Secondary | ICD-10-CM | POA: Diagnosis not present

## 2019-08-25 DIAGNOSIS — K219 Gastro-esophageal reflux disease without esophagitis: Secondary | ICD-10-CM | POA: Diagnosis not present

## 2019-08-25 DIAGNOSIS — F800 Phonological disorder: Secondary | ICD-10-CM | POA: Diagnosis not present

## 2019-08-25 DIAGNOSIS — G8250 Quadriplegia, unspecified: Secondary | ICD-10-CM | POA: Diagnosis not present

## 2019-08-25 DIAGNOSIS — I10 Essential (primary) hypertension: Secondary | ICD-10-CM | POA: Diagnosis not present

## 2019-08-25 DIAGNOSIS — F068 Other specified mental disorders due to known physiological condition: Secondary | ICD-10-CM

## 2019-08-26 DIAGNOSIS — G4700 Insomnia, unspecified: Secondary | ICD-10-CM | POA: Diagnosis not present

## 2019-08-26 DIAGNOSIS — S069X0S Unspecified intracranial injury without loss of consciousness, sequela: Secondary | ICD-10-CM | POA: Diagnosis not present

## 2019-08-26 DIAGNOSIS — G8250 Quadriplegia, unspecified: Secondary | ICD-10-CM | POA: Diagnosis not present

## 2019-08-26 DIAGNOSIS — I10 Essential (primary) hypertension: Secondary | ICD-10-CM | POA: Diagnosis not present

## 2019-08-26 DIAGNOSIS — F800 Phonological disorder: Secondary | ICD-10-CM | POA: Diagnosis not present

## 2019-08-26 DIAGNOSIS — K219 Gastro-esophageal reflux disease without esophagitis: Secondary | ICD-10-CM | POA: Diagnosis not present

## 2019-08-28 DIAGNOSIS — G4700 Insomnia, unspecified: Secondary | ICD-10-CM | POA: Diagnosis not present

## 2019-08-28 DIAGNOSIS — F800 Phonological disorder: Secondary | ICD-10-CM | POA: Diagnosis not present

## 2019-08-28 DIAGNOSIS — S069X0S Unspecified intracranial injury without loss of consciousness, sequela: Secondary | ICD-10-CM | POA: Diagnosis not present

## 2019-08-28 DIAGNOSIS — G8250 Quadriplegia, unspecified: Secondary | ICD-10-CM | POA: Diagnosis not present

## 2019-08-28 DIAGNOSIS — I10 Essential (primary) hypertension: Secondary | ICD-10-CM | POA: Diagnosis not present

## 2019-08-28 DIAGNOSIS — K219 Gastro-esophageal reflux disease without esophagitis: Secondary | ICD-10-CM | POA: Diagnosis not present

## 2019-08-28 DIAGNOSIS — Z87891 Personal history of nicotine dependence: Secondary | ICD-10-CM | POA: Diagnosis not present

## 2019-08-30 ENCOUNTER — Telehealth: Payer: Self-pay

## 2019-08-30 DIAGNOSIS — G8250 Quadriplegia, unspecified: Secondary | ICD-10-CM | POA: Diagnosis not present

## 2019-08-30 DIAGNOSIS — G4700 Insomnia, unspecified: Secondary | ICD-10-CM | POA: Diagnosis not present

## 2019-08-30 DIAGNOSIS — S069X0S Unspecified intracranial injury without loss of consciousness, sequela: Secondary | ICD-10-CM | POA: Diagnosis not present

## 2019-08-30 DIAGNOSIS — F800 Phonological disorder: Secondary | ICD-10-CM | POA: Diagnosis not present

## 2019-08-30 DIAGNOSIS — K219 Gastro-esophageal reflux disease without esophagitis: Secondary | ICD-10-CM | POA: Diagnosis not present

## 2019-08-30 DIAGNOSIS — I10 Essential (primary) hypertension: Secondary | ICD-10-CM | POA: Diagnosis not present

## 2019-08-30 NOTE — Telephone Encounter (Signed)
Mallory OT [facility] called requesting verbal orders for 1wk3.  Called her back and approved verbal orders

## 2019-09-01 ENCOUNTER — Telehealth: Payer: Self-pay

## 2019-09-01 NOTE — Telephone Encounter (Signed)
Sharyn Lull, PT from [facility] called stating its difficult working with patient. She can't tolerate much exercise to her leg and became agitated today and kicked Sharyn Lull out cussing and fussing. Mom states she will speak with Dad about getting patient something for aniexty.

## 2019-09-02 DIAGNOSIS — G8250 Quadriplegia, unspecified: Secondary | ICD-10-CM | POA: Diagnosis not present

## 2019-09-02 DIAGNOSIS — K219 Gastro-esophageal reflux disease without esophagitis: Secondary | ICD-10-CM | POA: Diagnosis not present

## 2019-09-02 DIAGNOSIS — I10 Essential (primary) hypertension: Secondary | ICD-10-CM | POA: Diagnosis not present

## 2019-09-02 DIAGNOSIS — G4700 Insomnia, unspecified: Secondary | ICD-10-CM | POA: Diagnosis not present

## 2019-09-02 DIAGNOSIS — S069X0S Unspecified intracranial injury without loss of consciousness, sequela: Secondary | ICD-10-CM | POA: Diagnosis not present

## 2019-09-02 DIAGNOSIS — F800 Phonological disorder: Secondary | ICD-10-CM | POA: Diagnosis not present

## 2019-09-06 DIAGNOSIS — G8250 Quadriplegia, unspecified: Secondary | ICD-10-CM | POA: Diagnosis not present

## 2019-09-06 DIAGNOSIS — S069X0S Unspecified intracranial injury without loss of consciousness, sequela: Secondary | ICD-10-CM | POA: Diagnosis not present

## 2019-09-06 DIAGNOSIS — K219 Gastro-esophageal reflux disease without esophagitis: Secondary | ICD-10-CM | POA: Diagnosis not present

## 2019-09-06 DIAGNOSIS — I10 Essential (primary) hypertension: Secondary | ICD-10-CM | POA: Diagnosis not present

## 2019-09-06 DIAGNOSIS — F800 Phonological disorder: Secondary | ICD-10-CM | POA: Diagnosis not present

## 2019-09-06 DIAGNOSIS — G4700 Insomnia, unspecified: Secondary | ICD-10-CM | POA: Diagnosis not present

## 2019-09-08 DIAGNOSIS — I10 Essential (primary) hypertension: Secondary | ICD-10-CM | POA: Diagnosis not present

## 2019-09-08 DIAGNOSIS — F800 Phonological disorder: Secondary | ICD-10-CM | POA: Diagnosis not present

## 2019-09-08 DIAGNOSIS — G8250 Quadriplegia, unspecified: Secondary | ICD-10-CM | POA: Diagnosis not present

## 2019-09-08 DIAGNOSIS — S069X0S Unspecified intracranial injury without loss of consciousness, sequela: Secondary | ICD-10-CM | POA: Diagnosis not present

## 2019-09-08 DIAGNOSIS — G4700 Insomnia, unspecified: Secondary | ICD-10-CM | POA: Diagnosis not present

## 2019-09-08 DIAGNOSIS — K219 Gastro-esophageal reflux disease without esophagitis: Secondary | ICD-10-CM | POA: Diagnosis not present

## 2019-09-16 DIAGNOSIS — G4700 Insomnia, unspecified: Secondary | ICD-10-CM | POA: Diagnosis not present

## 2019-09-16 DIAGNOSIS — S069X0S Unspecified intracranial injury without loss of consciousness, sequela: Secondary | ICD-10-CM | POA: Diagnosis not present

## 2019-09-16 DIAGNOSIS — K219 Gastro-esophageal reflux disease without esophagitis: Secondary | ICD-10-CM | POA: Diagnosis not present

## 2019-09-16 DIAGNOSIS — I10 Essential (primary) hypertension: Secondary | ICD-10-CM | POA: Diagnosis not present

## 2019-09-16 DIAGNOSIS — F800 Phonological disorder: Secondary | ICD-10-CM | POA: Diagnosis not present

## 2019-09-16 DIAGNOSIS — G8250 Quadriplegia, unspecified: Secondary | ICD-10-CM | POA: Diagnosis not present

## 2019-09-17 ENCOUNTER — Other Ambulatory Visit: Payer: Self-pay | Admitting: Physical Medicine & Rehabilitation

## 2019-09-22 DIAGNOSIS — F800 Phonological disorder: Secondary | ICD-10-CM | POA: Diagnosis not present

## 2019-09-22 DIAGNOSIS — G8250 Quadriplegia, unspecified: Secondary | ICD-10-CM | POA: Diagnosis not present

## 2019-09-22 DIAGNOSIS — S069X0S Unspecified intracranial injury without loss of consciousness, sequela: Secondary | ICD-10-CM | POA: Diagnosis not present

## 2019-09-22 DIAGNOSIS — K219 Gastro-esophageal reflux disease without esophagitis: Secondary | ICD-10-CM | POA: Diagnosis not present

## 2019-09-22 DIAGNOSIS — I10 Essential (primary) hypertension: Secondary | ICD-10-CM | POA: Diagnosis not present

## 2019-09-22 DIAGNOSIS — G4700 Insomnia, unspecified: Secondary | ICD-10-CM | POA: Diagnosis not present

## 2019-09-23 ENCOUNTER — Ambulatory Visit: Payer: Medicare Other | Admitting: Physical Therapy

## 2019-09-24 ENCOUNTER — Telehealth: Payer: Self-pay | Admitting: *Deleted

## 2019-09-24 DIAGNOSIS — G8250 Quadriplegia, unspecified: Secondary | ICD-10-CM

## 2019-09-24 NOTE — Telephone Encounter (Signed)
Order printed off and faxed directly to [facility] OT with [facility]

## 2019-09-24 NOTE — Telephone Encounter (Addendum)
Mallory, OT, [email or web address]  left a message asking for verbal orders to extend [place].   Medical record reviewed. Social work note reviewed.  Verbal orders given per office protocol.    Also, she is asking for a order for an electric wheelchair for the patient to be faxed to [phone].  She says an independent Radiation protection practitioner is performing the evaluation and satisfying the requirements for insurance.  She just needs an order from the doctor authorizing an electric wheelchair.

## 2019-09-24 NOTE — Telephone Encounter (Signed)
Order for powered wheelchair entered into [employer]

## 2019-09-27 DIAGNOSIS — G8250 Quadriplegia, unspecified: Secondary | ICD-10-CM | POA: Diagnosis not present

## 2019-09-27 DIAGNOSIS — Z9181 History of falling: Secondary | ICD-10-CM | POA: Diagnosis not present

## 2019-09-27 DIAGNOSIS — M62462 Contracture of muscle, left lower leg: Secondary | ICD-10-CM | POA: Diagnosis not present

## 2019-09-27 DIAGNOSIS — F800 Phonological disorder: Secondary | ICD-10-CM | POA: Diagnosis not present

## 2019-09-27 DIAGNOSIS — G4700 Insomnia, unspecified: Secondary | ICD-10-CM | POA: Diagnosis not present

## 2019-09-27 DIAGNOSIS — I10 Essential (primary) hypertension: Secondary | ICD-10-CM | POA: Diagnosis not present

## 2019-09-27 DIAGNOSIS — M62461 Contracture of muscle, right lower leg: Secondary | ICD-10-CM | POA: Diagnosis not present

## 2019-09-27 DIAGNOSIS — Z87891 Personal history of nicotine dependence: Secondary | ICD-10-CM | POA: Diagnosis not present

## 2019-09-27 DIAGNOSIS — S069X0S Unspecified intracranial injury without loss of consciousness, sequela: Secondary | ICD-10-CM | POA: Diagnosis not present

## 2019-09-27 DIAGNOSIS — K219 Gastro-esophageal reflux disease without esophagitis: Secondary | ICD-10-CM | POA: Diagnosis not present

## 2019-09-30 DIAGNOSIS — F800 Phonological disorder: Secondary | ICD-10-CM | POA: Diagnosis not present

## 2019-09-30 DIAGNOSIS — G8250 Quadriplegia, unspecified: Secondary | ICD-10-CM | POA: Diagnosis not present

## 2019-09-30 DIAGNOSIS — S069X0S Unspecified intracranial injury without loss of consciousness, sequela: Secondary | ICD-10-CM | POA: Diagnosis not present

## 2019-09-30 DIAGNOSIS — G4700 Insomnia, unspecified: Secondary | ICD-10-CM | POA: Diagnosis not present

## 2019-09-30 DIAGNOSIS — I10 Essential (primary) hypertension: Secondary | ICD-10-CM | POA: Diagnosis not present

## 2019-09-30 DIAGNOSIS — K219 Gastro-esophageal reflux disease without esophagitis: Secondary | ICD-10-CM | POA: Diagnosis not present

## 2019-10-14 DIAGNOSIS — G8250 Quadriplegia, unspecified: Secondary | ICD-10-CM | POA: Diagnosis not present

## 2019-10-14 DIAGNOSIS — I10 Essential (primary) hypertension: Secondary | ICD-10-CM | POA: Diagnosis not present

## 2019-10-14 DIAGNOSIS — K219 Gastro-esophageal reflux disease without esophagitis: Secondary | ICD-10-CM | POA: Diagnosis not present

## 2019-10-14 DIAGNOSIS — F800 Phonological disorder: Secondary | ICD-10-CM | POA: Diagnosis not present

## 2019-10-14 DIAGNOSIS — G4700 Insomnia, unspecified: Secondary | ICD-10-CM | POA: Diagnosis not present

## 2019-10-14 DIAGNOSIS — S069X0S Unspecified intracranial injury without loss of consciousness, sequela: Secondary | ICD-10-CM | POA: Diagnosis not present

## 2019-10-26 ENCOUNTER — Other Ambulatory Visit: Payer: Self-pay | Admitting: Physical Medicine & Rehabilitation

## 2019-10-27 DIAGNOSIS — Z87891 Personal history of nicotine dependence: Secondary | ICD-10-CM

## 2019-10-27 DIAGNOSIS — F800 Phonological disorder: Secondary | ICD-10-CM

## 2019-10-27 DIAGNOSIS — M62462 Contracture of muscle, left lower leg: Secondary | ICD-10-CM

## 2019-10-27 DIAGNOSIS — Z9181 History of falling: Secondary | ICD-10-CM

## 2019-10-27 DIAGNOSIS — G8250 Quadriplegia, unspecified: Secondary | ICD-10-CM | POA: Diagnosis not present

## 2019-10-27 DIAGNOSIS — M62461 Contracture of muscle, right lower leg: Secondary | ICD-10-CM

## 2019-10-27 DIAGNOSIS — K219 Gastro-esophageal reflux disease without esophagitis: Secondary | ICD-10-CM

## 2019-10-27 DIAGNOSIS — I10 Essential (primary) hypertension: Secondary | ICD-10-CM | POA: Diagnosis not present

## 2019-10-27 DIAGNOSIS — G4700 Insomnia, unspecified: Secondary | ICD-10-CM | POA: Diagnosis not present

## 2019-10-27 DIAGNOSIS — S069X0S Unspecified intracranial injury without loss of consciousness, sequela: Secondary | ICD-10-CM | POA: Diagnosis not present

## 2019-10-28 DIAGNOSIS — S069X0S Unspecified intracranial injury without loss of consciousness, sequela: Secondary | ICD-10-CM | POA: Diagnosis not present

## 2019-10-28 DIAGNOSIS — F800 Phonological disorder: Secondary | ICD-10-CM | POA: Diagnosis not present

## 2019-10-28 DIAGNOSIS — K219 Gastro-esophageal reflux disease without esophagitis: Secondary | ICD-10-CM | POA: Diagnosis not present

## 2019-10-28 DIAGNOSIS — I10 Essential (primary) hypertension: Secondary | ICD-10-CM | POA: Diagnosis not present

## 2019-10-28 DIAGNOSIS — G4700 Insomnia, unspecified: Secondary | ICD-10-CM | POA: Diagnosis not present

## 2019-10-28 DIAGNOSIS — G8250 Quadriplegia, unspecified: Secondary | ICD-10-CM | POA: Diagnosis not present

## 2019-11-24 ENCOUNTER — Encounter: Payer: Medicare Other | Admitting: Physical Medicine & Rehabilitation

## 2019-12-07 ENCOUNTER — Other Ambulatory Visit: Payer: Self-pay | Admitting: Physical Medicine & Rehabilitation

## 2019-12-22 ENCOUNTER — Encounter: Payer: Self-pay | Admitting: Physical Medicine & Rehabilitation

## 2019-12-22 ENCOUNTER — Other Ambulatory Visit: Payer: Self-pay

## 2019-12-22 ENCOUNTER — Encounter: Payer: Medicare Other | Attending: Physical Medicine & Rehabilitation | Admitting: Physical Medicine & Rehabilitation

## 2019-12-22 VITALS — BP 104/71 | HR 68 | Temp 97.7°F

## 2019-12-22 DIAGNOSIS — G8250 Quadriplegia, unspecified: Secondary | ICD-10-CM | POA: Diagnosis present

## 2019-12-22 DIAGNOSIS — M24522 Contracture, left elbow: Secondary | ICD-10-CM | POA: Insufficient documentation

## 2019-12-22 DIAGNOSIS — Z87820 Personal history of traumatic brain injury: Secondary | ICD-10-CM | POA: Insufficient documentation

## 2019-12-22 NOTE — Patient Instructions (Signed)
PLEASE FEEL FREE TO CALL OUR OFFICE WITH ANY PROBLEMS OR QUESTIONS [phone]  Continue working on stretching your left ankle and elbow every day!   COVID-19 Vaccine Information can be found at: [email or web address] For questions related to vaccine distribution or appointments, please email [email or web address] or call [phone].

## 2019-12-22 NOTE — Progress Notes (Signed)
Subjective:    Patient ID: [name], female    DOB: [birth year], 39 y.o.   MRN: [id number]  HPI   Sariya is here in follow up of her TBI. She is awaiting her power wheelchair any day now.  She remains home with mom who is her primary caregiver.  They work on some basic stretching and mobility.  Pain levels have been for the most part under control.  She is using tramadol and ibuprofen.  She has intermittent spasms sometimes in the right lower extremities.  Contractures are ongoing.  Gladine does do some stretching on her own at home but it is generally limited due to her pain and behavioral/cognitive issues.  Skin has been intact.  Nutritionally she has been doing well.  Mood has been generally positive with occasional exceptions.     Pain Inventory Average Pain 4 Pain Right Now 3 My pain is dull and aching  In the last 24 hours, has pain interfered with the following? General activity 2 Relation with others 4 Enjoyment of life 4 What TIME of day is your pain at its worst? evening Sleep (in general) Fair  Pain is worse with: inactivity Pain improves with: heat/ice, therapy/exercise and medication Relief from Meds: 5  Mobility ability to climb steps?  no do you drive?  no use a wheelchair needs help with transfers  Function disabled: date disabled 01/04/2014 I need assistance with the following:  feeding, dressing, bathing, toileting, meal prep, household duties and shopping  Neuro/Psych spasms confusion  Prior Studies Any changes since last visit?  no  Physicians involved in your care Any changes since last visit?  no   History reviewed. No pertinent family history. Social History   Socioeconomic History  . Marital status: Divorced    Spouse name: Not on file  . Number of children: Not on file  . Years of education: Not on file  . Highest education level: Not on file  Occupational History  . Not on file  Tobacco Use  . Smoking status: Former Smoker     Quit date: 01/04/2014    Years since quitting: 5.9  . Smokeless tobacco: Never Used  Substance and Sexual Activity  . Alcohol use: No  . Drug use: No  . Sexual activity: Not on file  Other Topics Concern  . Not on file  Social History Narrative  . Not on file   Social Determinants of Health   Financial Resource Strain:   . Difficulty of Paying Living Expenses: Not on file  Food Insecurity:   . Worried About Charity fundraiser in the Last Year: Not on file  . Ran Out of Food in the Last Year: Not on file  Transportation Needs:   . Lack of Transportation (Medical): Not on file  . Lack of Transportation (Non-Medical): Not on file  Physical Activity:   . Days of Exercise per Week: Not on file  . Minutes of Exercise per Session: Not on file  Stress:   . Feeling of Stress : Not on file  Social Connections:   . Frequency of Communication with Friends and Family: Not on file  . Frequency of Social Gatherings with Friends and Family: Not on file  . Attends Religious Services: Not on file  . Active Member of Clubs or Organizations: Not on file  . Attends Archivist Meetings: Not on file  . Marital Status: Not on file   Past Surgical History:  Procedure Laterality Date  .  ORIF ELBOW FRACTURE Left 01/04/2014   Procedure: OPEN REDUCTION INTERNAL FIXATION (ORIF) ELBOW/OLECRANON FRACTURE;  Surgeon: Alta Corning, MD;  Location: [place];  Service: Orthopedics;  Laterality: Left;  . ORIF HUMERUS FRACTURE Left 01/04/2014   Procedure: OPEN REDUCTION INTERNAL FIXATION (ORIF) HUMERAL SHAFT FRACTURE;  Surgeon: Alta Corning, MD;  Location: [place];  Service: Orthopedics;  Laterality: Left;  . ORIF RADIAL FRACTURE Left 01/04/2014   Procedure: OPEN REDUCTION INTERNAL FIXATION (ORIF) RADIAL FRACTURE;  Surgeon: Alta Corning, MD;  Location: [place];  Service: Orthopedics;  Laterality: Left;  . ORIF ULNAR FRACTURE Left 01/04/2014   Procedure: OPEN REDUCTION INTERNAL FIXATION (ORIF) ULNAR  FRACTURE;  Surgeon: Alta Corning, MD;  Location: [place];  Service: Orthopedics;  Laterality: Left;  . PEG PLACEMENT N/A 01/12/2014   Procedure: PERCUTANEOUS ENDOSCOPIC GASTROSTOMY (PEG) PLACEMENT;  Surgeon: Gwenyth Ober, MD;  Location: [place];  Service: General;  Laterality: N/A;    bedside trach and peg  . PEG TUBE REMOVAL  02/13/2015  . PERCUTANEOUS TRACHEOSTOMY N/A 01/12/2014   Procedure: PERCUTANEOUS TRACHEOSTOMY;  Surgeon: Gwenyth Ober, MD;  Location: [place];  Service: General;  Laterality: N/A;  BEDSIDE TRACH  . TENDON TRANSFER Left 05/22/2017   Procedure: TRANSFER PROFUNDUS TO SUPERFICIALIS LENGTHENING LEFT FINGERS, PROFUNDUS FUNCTIONAL SUPERFUNTIONALIS WEAK NEURECTOMY ULNAR NERVE WRIST;  Surgeon: Daryll Brod, MD;  Location: [place];  Service: Orthopedics;  Laterality: Left;  . TUBAL LIGATION  2003   Past Medical History:  Diagnosis Date  . Complication of anesthesia    severe crying, hypersensitivity to pain  . GERD (gastroesophageal reflux disease)   . History of traumatic brain injury 01/04/2014  . Hypertension    under control with med., has been on med. since 2015  . Spasticity    legs, feet, left arm, left hand  . Subluxation of left shoulder joint 01/04/2014   ongoing  . Unable to walk    BP 104/71   Pulse 68   Temp 97.7 F (36.5 C)   SpO2 97%   Opioid Risk Score:   Fall Risk Score:  `1  Depression screen PHQ 2/9  Depression screen [facility] 2/9 12/22/2018 12/29/2017  Decreased Interest 0 0  Down, Depressed, Hopeless 0 0  PHQ - 2 Score 0 0  Some recent data might be hidden     Review of Systems  Constitutional: Negative.   HENT: Negative.   Eyes: Negative.   Respiratory: Negative.   Cardiovascular: Negative.   Gastrointestinal: Positive for constipation.  Endocrine: Negative.   Genitourinary: Negative.   Musculoskeletal: Negative.        Spasms  Skin: Negative.   Allergic/Immunologic: Negative.   Neurological: Negative.    Hematological: Negative.   Psychiatric/Behavioral: Positive for confusion.  All other systems reviewed and are negative.      Objective:   Physical Exam General: No acute distress, in her wheelchair in the reclined position. HEENT: EOMI, oral membranes moist Cards: reg rate  Chest: normal effort Abdomen: Soft, NT, ND Skin: dry, intact Extremities: no edema Neuro: Tone remains 2 out of 4 at the left [place]. contracted wrist and eblow/shoulder LUE with underlying tone.  RLE: 3/-44 right hamstringwith increased hip flexor tightness and external rotation of the leg--really non functional. RLE: .. -in severe equinovarus position bilaterally, can extend left knee to neutral.  Can lift left leg easily against gravity.  remains unchanged.improved attention and awareness. Still impulsive.  Musc: pain with attempts at rom of left  upper and bilateral LE's. .       Assessment & Plan:  1.TBI with spastic tetraplegia: -continued HEP, HH services as possible -S/Ptendon lengtheningand transfersleft wrist/fingers. -Continue with home exercise program for wrist. Discussed at length the fact that she needs to work on stretching left elbow/ankle. She may be a candiate for lengthening of left heel cord but would need to stretch aggressively first.   2. This patient also requires a personal care attendant at all times.  3.Left elbow pain/contracture:continue HEP, daily stretching. See above 5.Continue Dantrium at 50mg  qid --no RF today  7.  Constipation:soft/lax 9.Skin:. Keep skin moist 10.Pain relief:Tramadol or ibuprofenas needed and prior to stretching. 11. Insomnia: seroquel 200mg  qhs. -doing fairly well at present most nights        Fifteen minutes of face to face patient care time were spent during this visit. All questions were encouraged and answered.  Follow up with me in 6 mos .

## 2020-01-21 ENCOUNTER — Other Ambulatory Visit: Payer: Self-pay | Admitting: Physical Medicine & Rehabilitation

## 2020-01-21 NOTE — Telephone Encounter (Signed)
Recieved electronic medication refill request for lorazepam medication.  No mention of this medication in previous note, unsure If ok to refill.  Please advise.

## 2020-02-23 ENCOUNTER — Other Ambulatory Visit: Payer: Self-pay | Admitting: Physical Medicine & Rehabilitation

## 2020-03-15 DIAGNOSIS — Z87820 Personal history of traumatic brain injury: Secondary | ICD-10-CM | POA: Diagnosis not present

## 2020-04-12 ENCOUNTER — Telehealth: Payer: Self-pay

## 2020-04-12 NOTE — Telephone Encounter (Signed)
[place] daughter of patient called asking for Dr. [name] to call her she has questions about his medication. I tried getting more information from her but she did not comply. Do not see her name on DPR.

## 2020-04-12 NOTE — Telephone Encounter (Signed)
Called and spoke to daughter. Daughter wanted to know about safety of taking vaccine.

## 2020-04-14 DIAGNOSIS — Z87820 Personal history of traumatic brain injury: Secondary | ICD-10-CM | POA: Diagnosis not present

## 2020-04-19 ENCOUNTER — Encounter: Payer: Medicare HMO | Admitting: Physical Medicine & Rehabilitation

## 2020-04-29 DIAGNOSIS — R569 Unspecified convulsions: Secondary | ICD-10-CM | POA: Diagnosis not present

## 2020-04-29 DIAGNOSIS — I10 Essential (primary) hypertension: Secondary | ICD-10-CM | POA: Diagnosis not present

## 2020-04-29 DIAGNOSIS — Z7401 Bed confinement status: Secondary | ICD-10-CM | POA: Diagnosis not present

## 2020-04-29 DIAGNOSIS — G8929 Other chronic pain: Secondary | ICD-10-CM | POA: Diagnosis not present

## 2020-04-29 DIAGNOSIS — R69 Illness, unspecified: Secondary | ICD-10-CM | POA: Diagnosis not present

## 2020-04-29 DIAGNOSIS — Z79891 Long term (current) use of opiate analgesic: Secondary | ICD-10-CM | POA: Diagnosis not present

## 2020-04-29 DIAGNOSIS — Z791 Long term (current) use of non-steroidal anti-inflammatories (NSAID): Secondary | ICD-10-CM | POA: Diagnosis not present

## 2020-04-29 DIAGNOSIS — K219 Gastro-esophageal reflux disease without esophagitis: Secondary | ICD-10-CM | POA: Diagnosis not present

## 2020-04-29 DIAGNOSIS — G4700 Insomnia, unspecified: Secondary | ICD-10-CM | POA: Diagnosis not present

## 2020-05-10 ENCOUNTER — Encounter: Payer: Medicare HMO | Admitting: Physical Medicine & Rehabilitation

## 2020-05-16 DIAGNOSIS — Z87820 Personal history of traumatic brain injury: Secondary | ICD-10-CM | POA: Diagnosis not present

## 2020-05-17 ENCOUNTER — Encounter: Payer: Self-pay | Admitting: Physical Medicine & Rehabilitation

## 2020-05-17 ENCOUNTER — Encounter: Payer: Medicare HMO | Attending: Physical Medicine & Rehabilitation | Admitting: Physical Medicine & Rehabilitation

## 2020-05-17 ENCOUNTER — Other Ambulatory Visit: Payer: Self-pay

## 2020-05-17 VITALS — BP 110/73 | HR 71 | Temp 98.5°F | Wt 173.0 lb

## 2020-05-17 DIAGNOSIS — Z87820 Personal history of traumatic brain injury: Secondary | ICD-10-CM | POA: Diagnosis not present

## 2020-05-17 DIAGNOSIS — R4689 Other symptoms and signs involving appearance and behavior: Secondary | ICD-10-CM | POA: Insufficient documentation

## 2020-05-17 DIAGNOSIS — S069X0S Unspecified intracranial injury without loss of consciousness, sequela: Secondary | ICD-10-CM | POA: Diagnosis not present

## 2020-05-17 DIAGNOSIS — R69 Illness, unspecified: Secondary | ICD-10-CM | POA: Diagnosis not present

## 2020-05-17 DIAGNOSIS — F068 Other specified mental disorders due to known physiological condition: Secondary | ICD-10-CM | POA: Insufficient documentation

## 2020-05-17 DIAGNOSIS — S069XAS Unspecified intracranial injury with loss of consciousness status unknown, sequela: Secondary | ICD-10-CM | POA: Insufficient documentation

## 2020-05-17 MED ORDER — DIVALPROEX SODIUM 250 MG PO DR TAB: 250.0000 mg | DELAYED_RELEASE_TABLET | Freq: Two times a day (BID) | ORAL | 2 refills | Status: DC

## 2020-05-17 MED ORDER — TRAZODONE HCL 100 MG PO TABS: 100.0000 mg | ORAL_TABLET | Freq: Every day | ORAL | 2 refills | Status: DC

## 2020-05-17 NOTE — Patient Instructions (Addendum)
PLEASE FEEL FREE TO CALL OUR OFFICE WITH ANY PROBLEMS OR QUESTIONS ([phone])  DECREASE PROPRANOLOL TO 40MG  TWICE DAILY FOR 2 WEEKS THEN DECREASED TO 20MG  TWICE DAILY FOR 2 WEEKS THEN STOP.

## 2020-05-17 NOTE — Progress Notes (Signed)
Subjective:    Patient ID: [name], female    DOB: 10-31-1981, 39 y.o.   MRN: [id number]  HPI   [name] is here in follow up of her TBI. Mom tells me that they are making some further adjustments to her power wheel chair. She should be receiving it in its final state sometime soon.   Mom notes more profanity and agitated, impulsive behavior and emotions. There is a 89 yo baby at home. She also tends to be more agitated when she returns home from her dad's (per mom). She is currently on propranolol 40mg  tid as well as seroquel 200mg  at night. She also uses trazodone 100mg  qhs for sleep.   Family works on rom at home. Pain and behavior still limit the extent of stretching they can do.     Pain Inventory Average Pain 2 Pain Right Now 0 My pain is dull, stabbing and aching  In the last 24 hours, has pain interfered with the following? General activity 3 Relation with others 3 Enjoyment of life 4 What TIME of day is your pain at its worst? evening Sleep (in general) Good  Pain is worse with: inactivity and some activites Pain improves with: heat/ice, therapy/exercise and medication Relief from Meds: 3  Mobility ability to climb steps?  no do you drive?  no  Function disabled: date disabled 2015 I need assistance with the following:  feeding, dressing, bathing, toileting, meal prep, household duties and shopping  Neuro/Psych spasms confusion  Prior Studies Any changes since last visit?  no  Physicians involved in your care Any changes since last visit?  no   No family history on file. Social History   Socioeconomic History  . Marital status: Divorced    Spouse name: Not on file  . Number of children: Not on file  . Years of education: Not on file  . Highest education level: Not on file  Occupational History  . Not on file  Tobacco Use  . Smoking status: Former Smoker    Quit date: 01/04/2014    Years since quitting: 6.3  . Smokeless tobacco: Never  Used  Substance and Sexual Activity  . Alcohol use: No  . Drug use: No  . Sexual activity: Not on file  Other Topics Concern  . Not on file  Social History Narrative  . Not on file   Social Determinants of Health   Financial Resource Strain:   . Difficulty of Paying Living Expenses:   Food Insecurity:   . Worried About Charity fundraiser in the Last Year:   . Arboriculturist in the Last Year:   Transportation Needs:   . Film/video editor (Medical):   Marland Kitchen Lack of Transportation (Non-Medical):   Physical Activity:   . Days of Exercise per Week:   . Minutes of Exercise per Session:   Stress:   . Feeling of Stress :   Social Connections:   . Frequency of Communication with Friends and Family:   . Frequency of Social Gatherings with Friends and Family:   . Attends Religious Services:   . Active Member of Clubs or Organizations:   . Attends Archivist Meetings:   Marland Kitchen Marital Status:    Past Surgical History:  Procedure Laterality Date  . ORIF ELBOW FRACTURE Left 01/04/2014   Procedure: OPEN REDUCTION INTERNAL FIXATION (ORIF) ELBOW/OLECRANON FRACTURE;  Surgeon: Alta Corning, MD;  Location: [facility];  Service: Orthopedics;  Laterality: Left;  .  ORIF HUMERUS FRACTURE Left 01/04/2014   Procedure: OPEN REDUCTION INTERNAL FIXATION (ORIF) HUMERAL SHAFT FRACTURE;  Surgeon: Alta Corning, MD;  Location: [facility];  Service: Orthopedics;  Laterality: Left;  . ORIF RADIAL FRACTURE Left 01/04/2014   Procedure: OPEN REDUCTION INTERNAL FIXATION (ORIF) RADIAL FRACTURE;  Surgeon: Alta Corning, MD;  Location: [facility];  Service: Orthopedics;  Laterality: Left;  . ORIF ULNAR FRACTURE Left 01/04/2014   Procedure: OPEN REDUCTION INTERNAL FIXATION (ORIF) ULNAR FRACTURE;  Surgeon: Alta Corning, MD;  Location: [facility];  Service: Orthopedics;  Laterality: Left;  . PEG PLACEMENT N/A 01/12/2014   Procedure: PERCUTANEOUS ENDOSCOPIC GASTROSTOMY (PEG) PLACEMENT;  Surgeon: Gwenyth Ober, MD;  Location: [facility];  Service: General;  Laterality: N/A;    bedside trach and peg  . PEG TUBE REMOVAL  02/13/2015  . PERCUTANEOUS TRACHEOSTOMY N/A 01/12/2014   Procedure: PERCUTANEOUS TRACHEOSTOMY;  Surgeon: Gwenyth Ober, MD;  Location: [facility];  Service: General;  Laterality: N/A;  BEDSIDE TRACH  . TENDON TRANSFER Left 05/22/2017   Procedure: TRANSFER PROFUNDUS TO SUPERFICIALIS LENGTHENING LEFT FINGERS, PROFUNDUS FUNCTIONAL SUPERFUNTIONALIS WEAK NEURECTOMY ULNAR NERVE WRIST;  Surgeon: Daryll Brod, MD;  Location: [facility];  Service: Orthopedics;  Laterality: Left;  . TUBAL LIGATION  2003   Past Medical History:  Diagnosis Date  . Complication of anesthesia    severe crying, hypersensitivity to pain  . GERD (gastroesophageal reflux disease)   . History of traumatic brain injury 01/04/2014  . Hypertension    under control with med., has been on med. since 2015  . Spasticity    legs, feet, left arm, left hand  . Subluxation of left shoulder joint 01/04/2014   ongoing  . Unable to walk    BP 110/73   Pulse 71   Temp 98.5 F (36.9 C)   Wt 173 lb (78.5 kg) Comment: pt reported, in wheelchair  SpO2 98%   BMI 27.92 kg/m   Opioid Risk Score:   Fall Risk Score:  `1  Depression screen PHQ 2/9  Depression screen [facility] 2/9 12/22/2018 12/29/2017  Decreased Interest 0 0  Down, Depressed, Hopeless 0 0  PHQ - 2 Score 0 0  Some recent data might be hidden    Review of Systems  Neurological:       Spasms   Psychiatric/Behavioral: Positive for confusion.  All other systems reviewed and are negative.      Objective:   Physical Exam Physical Exam General: No acute distress HEENT: EOMI, oral membranes moist Cards: reg rate  Chest: normal effort Abdomen: Soft, NT, ND Skin: dry, intact Extremities: no edema Neuro:Contractedt and eblow/shoulder LUE with underlying tone.   RLE: 3/-44 right hamstring with increased hip flexor tightness and external rotation of the leg. RLE:  ..remains in severe equinovarus position bilaterally, can extend left knee to neutral.  Can lift left leg easily against gravity.  remains unchanged.  improved attention and awareness. Impulsive with positive and negative Musc: tender left elbow and bilateral knees, let ankle           Assessment & Plan:   1. TBI with spastic tetraplegia:  -continued HEP, HH services as possible -S/P tendon lengthening and transfers left wrist/fingers.  -Continue with home exercise program for wrist. Discussed at length the fact that she needs to work on stretching left elbow/ankle. She may be a candiate for lengthening of left heel cord but would need to stretch aggressively first. she doesn't tolerate PROM.  2. This patient also requires a personal care attendant at all times.   3.  Left elbow pain/contracture: continue HEP, daily stretching. See above 5.  Continue Dantrium at 50mg  qid --no RF needed today  7.  Constipation: soft/lax 9. Behavior: wean propranolol to off. Begin trial of depakote 250mg  bid.  -seroquel as below -levels, labs at some point depending on dose/course 10. Pain relief: Tramadol or ibuprofenprn 11. Insomnia: seroquel  200mg  qhs with trazodone.  -doing fairly well at present most nights   Fifteen minutes of face to face patient care time were spent during this visit. All questions were encouraged and answered.  Follow up with me in 2 mos .

## 2020-06-06 ENCOUNTER — Telehealth: Payer: Self-pay | Admitting: *Deleted

## 2020-06-06 NOTE — Telephone Encounter (Signed)
I called both of Mom's numbers and no answer or VM. I'm a little confused, because we started the depakote for agitation and aggressive behavior....not for depression and anxiety.   The depakote dose is pretty small. It's certainly possible that it could make her a little sleepy, but I would be surprised if it made her sleep excessively; especially since we weaned off propranolol.   Let me know if you can get a hold of her.

## 2020-06-06 NOTE — Telephone Encounter (Signed)
Patients caregiver states that the switch from propranolol to depakote is not working.  She states that patients depression ans anxiety has increased.  She states patient is sleeping excessively.  She is asking for Dr. [name] recommendation.

## 2020-06-07 NOTE — Telephone Encounter (Signed)
I was able to contact Toma Aran, patients caregiver.  I informed that Dr. [name] attempted contact.  I asked caregiver to please check if her voicemail is activated or if the mailbox is full..  She asked if Dr. [name] could please try to call back on 06/17/2020 at [phone]

## 2020-06-08 NOTE — Telephone Encounter (Signed)
I took a chance and decided to call mom today, rather than waiting for 7/10..........   Zahraa seems to be doing better over the last few days with improved arousal but also better control of emotional lability. We will stick with the current dosing and observe for now. Mom was instructed to call the office back if she has any further problems or if agitation increases.

## 2020-06-09 ENCOUNTER — Other Ambulatory Visit: Payer: Self-pay | Admitting: Physical Medicine & Rehabilitation

## 2020-06-09 DIAGNOSIS — S060X0A Concussion without loss of consciousness, initial encounter: Secondary | ICD-10-CM | POA: Diagnosis not present

## 2020-06-09 DIAGNOSIS — S061X0A Traumatic cerebral edema without loss of consciousness, initial encounter: Secondary | ICD-10-CM | POA: Diagnosis not present

## 2020-06-09 DIAGNOSIS — R32 Unspecified urinary incontinence: Secondary | ICD-10-CM | POA: Diagnosis not present

## 2020-06-09 DIAGNOSIS — R1310 Dysphagia, unspecified: Secondary | ICD-10-CM | POA: Diagnosis not present

## 2020-07-19 ENCOUNTER — Encounter: Payer: Medicare HMO | Admitting: Physical Medicine & Rehabilitation

## 2020-08-02 ENCOUNTER — Encounter: Payer: Medicare HMO | Admitting: Physical Medicine & Rehabilitation

## 2020-08-07 ENCOUNTER — Other Ambulatory Visit: Payer: Self-pay | Admitting: Physical Medicine & Rehabilitation

## 2020-08-07 DIAGNOSIS — S069X0S Unspecified intracranial injury without loss of consciousness, sequela: Secondary | ICD-10-CM

## 2020-08-07 DIAGNOSIS — F5104 Psychophysiologic insomnia: Secondary | ICD-10-CM

## 2020-08-07 DIAGNOSIS — Z87820 Personal history of traumatic brain injury: Secondary | ICD-10-CM

## 2020-08-07 DIAGNOSIS — R4689 Other symptoms and signs involving appearance and behavior: Secondary | ICD-10-CM

## 2020-08-09 ENCOUNTER — Ambulatory Visit: Payer: Medicare HMO | Admitting: Physical Medicine & Rehabilitation

## 2020-09-06 ENCOUNTER — Other Ambulatory Visit: Payer: Self-pay | Admitting: Physical Medicine & Rehabilitation

## 2020-09-13 ENCOUNTER — Encounter: Payer: Medicare HMO | Attending: Physical Medicine & Rehabilitation | Admitting: Physical Medicine & Rehabilitation

## 2020-09-19 DIAGNOSIS — R1310 Dysphagia, unspecified: Secondary | ICD-10-CM | POA: Diagnosis not present

## 2020-09-19 DIAGNOSIS — S061X0A Traumatic cerebral edema without loss of consciousness, initial encounter: Secondary | ICD-10-CM | POA: Diagnosis not present

## 2020-09-19 DIAGNOSIS — R32 Unspecified urinary incontinence: Secondary | ICD-10-CM | POA: Diagnosis not present

## 2020-09-19 DIAGNOSIS — S060X0A Concussion without loss of consciousness, initial encounter: Secondary | ICD-10-CM | POA: Diagnosis not present

## 2021-01-02 ENCOUNTER — Ambulatory Visit: Payer: Medicare HMO | Attending: Critical Care Medicine

## 2021-01-02 ENCOUNTER — Other Ambulatory Visit: Payer: Self-pay | Admitting: Physical Medicine & Rehabilitation

## 2021-01-02 DIAGNOSIS — Z23 Encounter for immunization: Secondary | ICD-10-CM

## 2021-01-02 NOTE — Progress Notes (Signed)
Covidobs  

## 2021-01-02 NOTE — Progress Notes (Signed)
   Covid-19 Vaccination Clinic  Name:  [name]    MRN: [id number] DOB: [birth year]  01/02/2021  [name] was observed post Covid-19 immunization for 15 minutes without incident. She was provided with Vaccine Information Sheet and instruction to access the V-Safe system.   [name] was instructed to call 911 with any severe reactions post vaccine: Marland Kitchen Difficulty breathing  . Swelling of face and throat  . A fast heartbeat  . A bad rash all over body  . Dizziness and weakness   Immunizations Administered    Name Date Dose VIS Date Route   PFIZER Comrnaty(Gray TOP) Covid-19 Vaccine 01/02/2021 11:00 AM 0.3 mL 11/16/2020 Intramuscular   Manufacturer: [place]   Lot: [id number]   [place]: [id number]      Covid-19 Vaccination Clinic  Name:  [name]    MRN: [id number] DOB: [birth year]  01/02/2021  [name] was observed post Covid-19 immunization for 15 minutes without incident. She was provided with Vaccine Information Sheet and instruction to access the V-Safe system.   [name] was instructed to call 911 with any severe reactions post vaccine: Marland Kitchen Difficulty breathing  . Swelling of face and throat  . A fast heartbeat  . A bad rash all over body  . Dizziness and weakness   Immunizations Administered    Name Date Dose VIS Date Route   PFIZER Comrnaty(Gray TOP) Covid-19 Vaccine 01/02/2021 11:00 AM 0.3 mL 11/16/2020 Intramuscular   Manufacturer: [place]   Lot: [id number]   [place]: [phone]

## 2021-01-22 ENCOUNTER — Other Ambulatory Visit: Payer: Self-pay | Admitting: Physical Medicine & Rehabilitation

## 2021-02-06 ENCOUNTER — Ambulatory Visit: Payer: Medicare HMO | Attending: Critical Care Medicine

## 2021-02-06 DIAGNOSIS — Z23 Encounter for immunization: Secondary | ICD-10-CM

## 2021-02-06 NOTE — Progress Notes (Signed)
   Covid-19 Vaccination Clinic  Name:  [name]    MRN: [id number] DOB: 1981/06/28  02/06/2021  [name] was observed post Covid-19 immunization for 15 MIN without incident. She was provided with Vaccine Information Sheet and instruction to access the V-Safe system.   [name] was instructed to call 911 with any severe reactions post vaccine: Marland Kitchen Difficulty breathing  . Swelling of face and throat  . A fast heartbeat  . A bad rash all over body  . Dizziness and weakness   Immunizations Administered    Name Date Dose VIS Date Route   PFIZER Comrnaty(Gray TOP) Covid-19 Vaccine 02/06/2021 11:05 AM 0.3 mL 11/16/2020 Intramuscular   Manufacturer: [employer]   Lot: [id number]   [place]: [phone]

## 2021-02-09 DIAGNOSIS — S061X0A Traumatic cerebral edema without loss of consciousness, initial encounter: Secondary | ICD-10-CM | POA: Diagnosis not present

## 2021-02-09 DIAGNOSIS — R32 Unspecified urinary incontinence: Secondary | ICD-10-CM | POA: Diagnosis not present

## 2021-02-12 ENCOUNTER — Telehealth: Payer: Self-pay | Admitting: *Deleted

## 2021-02-12 DIAGNOSIS — R4689 Other symptoms and signs involving appearance and behavior: Secondary | ICD-10-CM

## 2021-02-12 DIAGNOSIS — S069X0S Unspecified intracranial injury without loss of consciousness, sequela: Secondary | ICD-10-CM

## 2021-02-12 DIAGNOSIS — Z87820 Personal history of traumatic brain injury: Secondary | ICD-10-CM

## 2021-02-12 NOTE — Telephone Encounter (Signed)
[name]'s mother called and was asking about Dr [name] changing the dose of her depakote. She says they can use a little more calmness and less profanity etc. I explained that Dr [name] was out of the office all week and that he can address when he returns next week.  [name] says that Patricia has had both her covid 19 shots now if she should need an appt.  Please advise.

## 2021-02-19 MED ORDER — DIVALPROEX SODIUM 500 MG PO DR TAB
500.0000 mg | DELAYED_RELEASE_TABLET | Freq: Two times a day (BID) | ORAL | 3 refills | Status: DC
Start: 2021-02-19 — End: 2021-06-15

## 2021-02-19 NOTE — Telephone Encounter (Signed)
I increased depakote to 500mg  bid. She doesn't need an imminent appt but I should see her over the next few months.  thx

## 2021-02-20 ENCOUNTER — Other Ambulatory Visit: Payer: Self-pay | Admitting: Physical Medicine & Rehabilitation

## 2021-02-20 NOTE — Telephone Encounter (Signed)
Notified. 

## 2021-04-13 DIAGNOSIS — S061X0A Traumatic cerebral edema without loss of consciousness, initial encounter: Secondary | ICD-10-CM | POA: Diagnosis not present

## 2021-04-13 DIAGNOSIS — R32 Unspecified urinary incontinence: Secondary | ICD-10-CM | POA: Diagnosis not present

## 2021-04-13 DIAGNOSIS — S060X0A Concussion without loss of consciousness, initial encounter: Secondary | ICD-10-CM | POA: Diagnosis not present

## 2021-04-13 DIAGNOSIS — R1310 Dysphagia, unspecified: Secondary | ICD-10-CM | POA: Diagnosis not present

## 2021-06-13 ENCOUNTER — Other Ambulatory Visit: Payer: Self-pay

## 2021-06-13 ENCOUNTER — Ambulatory Visit: Payer: Medicare HMO | Attending: Critical Care Medicine

## 2021-06-13 DIAGNOSIS — Z23 Encounter for immunization: Secondary | ICD-10-CM

## 2021-06-14 NOTE — Progress Notes (Signed)
   Covid-19 Vaccination Clinic  Name:  [name]    MRN: [id number] DOB: [birth year]  06/14/2021  [name] was observed post Covid-19 immunization for 15 minutes without incident. She was provided with Vaccine Information Sheet and instruction to access the V-Safe system.   [name] was instructed to call 911 with any severe reactions post vaccine: Difficulty breathing  Swelling of face and throat  A fast heartbeat  A bad rash all over body  Dizziness and weakness   Immunizations Administered     Name Date Dose VIS Date Route   PFIZER Comrnaty(Gray TOP) Covid-19 Vaccine 06/13/2021  1:52 PM 0.3 mL 11/16/2020 Intramuscular   Manufacturer: [place]   Lot: [id number]   [place]: [phone]

## 2021-06-15 ENCOUNTER — Other Ambulatory Visit: Payer: Self-pay | Admitting: Physical Medicine & Rehabilitation

## 2021-06-15 DIAGNOSIS — Z87820 Personal history of traumatic brain injury: Secondary | ICD-10-CM

## 2021-06-15 DIAGNOSIS — R4689 Other symptoms and signs involving appearance and behavior: Secondary | ICD-10-CM

## 2021-07-20 ENCOUNTER — Telehealth: Payer: Self-pay | Admitting: *Deleted

## 2021-07-20 NOTE — Telephone Encounter (Signed)
Mr Jimmye Norman called to ask about [name]'s medications.  He is wondering if there has been a changed. He states she is with him and she is sleeping all the time.  Please advise.

## 2021-07-20 NOTE — Telephone Encounter (Signed)
I let him know.  

## 2021-08-10 ENCOUNTER — Other Ambulatory Visit: Payer: Self-pay | Admitting: Physical Medicine & Rehabilitation

## 2021-08-16 ENCOUNTER — Other Ambulatory Visit: Payer: Self-pay | Admitting: Physical Medicine & Rehabilitation

## 2021-08-16 DIAGNOSIS — R4689 Other symptoms and signs involving appearance and behavior: Secondary | ICD-10-CM

## 2021-08-16 DIAGNOSIS — F5104 Psychophysiologic insomnia: Secondary | ICD-10-CM

## 2021-08-16 DIAGNOSIS — Z87820 Personal history of traumatic brain injury: Secondary | ICD-10-CM

## 2021-08-16 DIAGNOSIS — F068 Other specified mental disorders due to known physiological condition: Secondary | ICD-10-CM

## 2021-08-16 DIAGNOSIS — S069X0S Unspecified intracranial injury without loss of consciousness, sequela: Secondary | ICD-10-CM

## 2021-08-23 DIAGNOSIS — R1310 Dysphagia, unspecified: Secondary | ICD-10-CM | POA: Diagnosis not present

## 2021-08-23 DIAGNOSIS — R32 Unspecified urinary incontinence: Secondary | ICD-10-CM | POA: Diagnosis not present

## 2021-08-23 DIAGNOSIS — S061X0A Traumatic cerebral edema without loss of consciousness, initial encounter: Secondary | ICD-10-CM | POA: Diagnosis not present

## 2021-08-23 DIAGNOSIS — S060X0A Concussion without loss of consciousness, initial encounter: Secondary | ICD-10-CM | POA: Diagnosis not present

## 2021-09-14 ENCOUNTER — Other Ambulatory Visit: Payer: Self-pay | Admitting: Physical Medicine & Rehabilitation

## 2021-09-26 ENCOUNTER — Telehealth: Payer: Self-pay | Admitting: *Deleted

## 2021-09-26 NOTE — Telephone Encounter (Signed)
Incoming fax from [facility] reviewing medication duplications. Pt on Meloxicam and Diclofenac. Per Dr [name], d/c meloxicam. Pharmacy notified and Toma Aran

## 2021-09-26 NOTE — Telephone Encounter (Signed)
Toma Aran reported that [name]'s wheelchair is no longer charging and needs a new replacement part for this. She asks if Dr [name] can order a replacement.

## 2021-09-27 NOTE — Telephone Encounter (Signed)
It is still under warranty but she has not been seen since 05/2020.  Mrs [name] is going to make appt for Taffie to be seen.

## 2021-11-09 ENCOUNTER — Other Ambulatory Visit: Payer: Self-pay | Admitting: Physical Medicine & Rehabilitation

## 2021-11-09 NOTE — Telephone Encounter (Signed)
Pharmacy is sending request for Lorazepam. Last visit was 05/17/21, but has an appointment on 12/05/21. Would you like to refill?

## 2021-12-05 ENCOUNTER — Encounter: Payer: Medicare HMO | Admitting: Physical Medicine & Rehabilitation

## 2022-01-28 ENCOUNTER — Other Ambulatory Visit: Payer: Self-pay | Admitting: Physical Medicine & Rehabilitation

## 2022-02-21 ENCOUNTER — Other Ambulatory Visit: Payer: Self-pay

## 2022-02-21 ENCOUNTER — Encounter (HOSPITAL_COMMUNITY): Payer: Self-pay

## 2022-02-21 ENCOUNTER — Emergency Department (HOSPITAL_COMMUNITY): Payer: Medicare Other

## 2022-02-21 ENCOUNTER — Emergency Department (HOSPITAL_COMMUNITY)
Admission: EM | Admit: 2022-02-21 | Discharge: 2022-02-21 | Disposition: A | Discharge status: Home / Self Care | Payer: Medicare Other | Attending: Emergency Medicine | Admitting: Emergency Medicine

## 2022-02-21 ENCOUNTER — Telehealth: Payer: Self-pay

## 2022-02-21 DIAGNOSIS — J020 Streptococcal pharyngitis: Secondary | ICD-10-CM | POA: Diagnosis not present

## 2022-02-21 DIAGNOSIS — Z20822 Contact with and (suspected) exposure to covid-19: Secondary | ICD-10-CM | POA: Insufficient documentation

## 2022-02-21 DIAGNOSIS — J029 Acute pharyngitis, unspecified: Secondary | ICD-10-CM | POA: Diagnosis present

## 2022-02-21 DIAGNOSIS — D649 Anemia, unspecified: Secondary | ICD-10-CM | POA: Diagnosis not present

## 2022-02-21 LAB — CBC WITH DIFFERENTIAL/PLATELET
Abs Immature Granulocytes: 0.07 10*3/uL (ref 0.00–0.07)
Basophils Absolute: 0 10*3/uL (ref 0.0–0.1)
Basophils Relative: 0 %
Eosinophils Absolute: 0.1 10*3/uL (ref 0.0–0.5)
Eosinophils Relative: 0 %
HCT: 27.3 % — ABNORMAL LOW (ref 36.0–46.0)
Hemoglobin: 7.4 g/dL — ABNORMAL LOW (ref 12.0–15.0)
Immature Granulocytes: 1 %
Lymphocytes Relative: 12 %
Lymphs Abs: 1.4 10*3/uL (ref 0.7–4.0)
MCH: 20.9 pg — ABNORMAL LOW (ref 26.0–34.0)
MCHC: 27.1 g/dL — ABNORMAL LOW (ref 30.0–36.0)
MCV: 77.1 fL — ABNORMAL LOW (ref 80.0–100.0)
Monocytes Absolute: 1.1 10*3/uL — ABNORMAL HIGH (ref 0.1–1.0)
Monocytes Relative: 10 %
Neutro Abs: 8.6 10*3/uL — ABNORMAL HIGH (ref 1.7–7.7)
Neutrophils Relative %: 77 %
RBC: 3.54 MIL/uL — ABNORMAL LOW (ref 3.87–5.11)
RDW: 19.1 % — ABNORMAL HIGH (ref 11.5–15.5)
Smear Review: UNDETERMINED
WBC Morphology: INCREASED
WBC: 11.2 10*3/uL — ABNORMAL HIGH (ref 4.0–10.5)
nRBC: 0 % (ref 0.0–0.2)

## 2022-02-21 LAB — COMPREHENSIVE METABOLIC PANEL
ALT: 12 U/L (ref 0–44)
AST: 14 U/L — ABNORMAL LOW (ref 15–41)
Albumin: 2.7 g/dL — ABNORMAL LOW (ref 3.5–5.0)
Alkaline Phosphatase: 49 U/L (ref 38–126)
Anion gap: 7 (ref 5–15)
BUN: 15 mg/dL (ref 6–20)
CO2: 26 mmol/L (ref 22–32)
Calcium: 7.9 mg/dL — ABNORMAL LOW (ref 8.9–10.3)
Chloride: 105 mmol/L (ref 98–111)
Creatinine, Ser: <0.3 mg/dL — ABNORMAL LOW (ref 0.44–1.00)
Glucose, Bld: 89 mg/dL (ref 70–99)
Potassium: 3.6 mmol/L (ref 3.5–5.1)
Sodium: 138 mmol/L (ref 135–145)
Total Bilirubin: 0.3 mg/dL (ref 0.3–1.2)
Total Protein: 5.5 g/dL — ABNORMAL LOW (ref 6.5–8.1)

## 2022-02-21 LAB — VALPROIC ACID LEVEL: Valproic Acid Lvl: 67 ug/mL (ref 50.0–100.0)

## 2022-02-21 LAB — RESP PANEL BY RT-PCR (FLU A&B, COVID) ARPGX2
Influenza A by PCR: NEGATIVE
Influenza B by PCR: NEGATIVE
SARS Coronavirus 2 by RT PCR: NEGATIVE

## 2022-02-21 LAB — GROUP A STREP BY PCR: Group A Strep by PCR: DETECTED — AB

## 2022-02-21 MED ORDER — AZITHROMYCIN 250 MG PO TABS: ORAL_TABLET | ORAL | 0 refills | Status: AC

## 2022-02-21 MED ORDER — AZITHROMYCIN 250 MG PO TABS
500.0000 mg | ORAL_TABLET | Freq: Once | ORAL | Status: AC
  Administered 2022-02-21: 500 mg via ORAL
  Filled 2022-02-21: qty 2
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
## 2022-02-21 NOTE — Discharge Instructions (Addendum)
Return if any problems.

## 2022-02-21 NOTE — Telephone Encounter (Signed)
Mother states patient does not have a PCP but mother wants an order to have a home health nurse come out regularly. ?

## 2022-02-21 NOTE — Telephone Encounter (Signed)
Patient mother called stating patient exposed to strep and she is having a sore throat. She is asking for a order to be sent to home health agency so to do a test. ?

## 2022-02-21 NOTE — ED Triage Notes (Signed)
"  Family states that another family member had strep throat recently that turned to pneumonia and now she is having cough and congestion and they want to be sure she isn't getting pneumonia too" per EMS ?

## 2022-02-22 NOTE — Telephone Encounter (Signed)
Mom took patient took to ER and she was positive for Strep, got antibiotic. Patient has appt on 3/29 and I advised mother to try her best to keep appt in case she would like to start up [facility]. ?

## 2022-03-06 ENCOUNTER — Encounter: Payer: Self-pay | Admitting: Physical Medicine & Rehabilitation

## 2022-03-06 ENCOUNTER — Other Ambulatory Visit: Payer: Self-pay

## 2022-03-06 ENCOUNTER — Encounter: Payer: Medicare Other | Attending: Physical Medicine & Rehabilitation | Admitting: Physical Medicine & Rehabilitation

## 2022-03-06 VITALS — BP 96/69 | HR 69

## 2022-03-06 DIAGNOSIS — G894 Chronic pain syndrome: Secondary | ICD-10-CM | POA: Insufficient documentation

## 2022-03-06 DIAGNOSIS — D62 Acute posthemorrhagic anemia: Secondary | ICD-10-CM | POA: Diagnosis present

## 2022-03-06 DIAGNOSIS — D229 Melanocytic nevi, unspecified: Secondary | ICD-10-CM | POA: Insufficient documentation

## 2022-03-06 DIAGNOSIS — Z5181 Encounter for therapeutic drug level monitoring: Secondary | ICD-10-CM | POA: Insufficient documentation

## 2022-03-06 DIAGNOSIS — S069X5S Unspecified intracranial injury with loss of consciousness greater than 24 hours with return to pre-existing conscious level, sequela: Secondary | ICD-10-CM | POA: Diagnosis present

## 2022-03-06 DIAGNOSIS — Z79891 Long term (current) use of opiate analgesic: Secondary | ICD-10-CM | POA: Insufficient documentation

## 2022-03-06 NOTE — Patient Instructions (Signed)
STOOL TEST FOR OCCULT BLOOD.  ?

## 2022-03-06 NOTE — Progress Notes (Signed)
? ?Subjective:  ? ? Patient ID: [name], female    DOB: 1981-05-03, 41 y.o.   MRN: [id number] ? ?[name] is here in follow up of her TBI. I last saw her in June of 2021. She was recently dx'ed with strep throat and completed a course of zithromax.  ? ?I am seeing her primarily for the purpose of a w/c evaluation. She is in need a new powered w/c to be independent within her home.  ? ?SHe was found to be anemic and hypalbuminemic on work up. She was place on a multivitamin with iron.  ? ?Mom admits that she's not eating as well as she used to. She had tried to lose weight initially by design.  ? ?Mood and behavior have been generally improved.  Mom and caregivers also had on their part and being able to manage situations more constructively, walking away when needed when in jail and really gets agitated.  Remains on Depakote for mood control. ? ? ?HPI ?Pain Inventory ?Average Pain 3 ?Pain Right Now 0 ?My pain is dull and aching ? ?LOCATION OF PAIN  neck, shoulder, elbow, wrist, hand, fingers, back, hip, knee, leg, ankle, toes ? ?BOWEL ?Number of stools per week: 2 ?Oral laxative use No  ?Type of laxative . ?Enema or suppository use Yes  ?History of colostomy No  ?Incontinent No  ? ?BLADDER ?Pads/diapers ?In and out cath, frequency . ?Able to self cath  . ?Bladder incontinence Yes  ?Frequent urination No  ?Leakage with coughing No  ?Difficulty starting stream No  ?Incomplete bladder emptying No  ? ? ?Mobility ?how many minutes can you walk? 0 ?ability to climb steps?  no ?do you drive?  no ?use a wheelchair ?needs help with transfers ? ?Function ?disabled: date disabled 01/04/2014 ?I need assistance with the following:  feeding, dressing, bathing, toileting, meal prep, household duties, and shopping ? ?Neuro/Psych ?bladder control problems ?weakness ?trouble walking ?spasms ?confusion ?anxiety ? ?Prior Studies ?x-rays ? ?Physicians involved in your care ?ED for strep ? ? ?No family history on file. ?Social  History  ? ?Socioeconomic History  ? Marital status: Divorced  ?  Spouse name: Not on file  ? Number of children: Not on file  ? Years of education: Not on file  ? Highest education level: Not on file  ?Occupational History  ? Not on file  ?Tobacco Use  ? Smoking status: Former  ?  Types: Cigarettes  ?  Quit date: 01/04/2014  ?  Years since quitting: 8.1  ? Smokeless tobacco: Never  ?Vaping Use  ? Vaping Use: Never used  ?Substance and Sexual Activity  ? Alcohol use: No  ? Drug use: No  ? Sexual activity: Not on file  ?Other Topics Concern  ? Not on file  ?Social History Narrative  ? Not on file  ? ?Social Determinants of Health  ? ?Financial Resource Strain: Not on file  ?Food Insecurity: Not on file  ?Transportation Needs: Not on file  ?Physical Activity: Not on file  ?Stress: Not on file  ?Social Connections: Not on file  ? ?Past Surgical History:  ?Procedure Laterality Date  ? ORIF ELBOW FRACTURE Left 01/04/2014  ? Procedure: OPEN REDUCTION INTERNAL FIXATION (ORIF) ELBOW/OLECRANON FRACTURE;  Surgeon: Alta Corning, MD;  Location: [place];  Service: Orthopedics;  Laterality: Left;  ? ORIF HUMERUS FRACTURE Left 01/04/2014  ? Procedure: OPEN REDUCTION INTERNAL FIXATION (ORIF) HUMERAL SHAFT FRACTURE;  Surgeon: Alta Corning, MD;  Location: [place];  Service: Orthopedics;  Laterality: Left;  ? ORIF RADIAL FRACTURE Left 01/04/2014  ? Procedure: OPEN REDUCTION INTERNAL FIXATION (ORIF) RADIAL FRACTURE;  Surgeon: Alta Corning, MD;  Location: [place];  Service: Orthopedics;  Laterality: Left;  ? ORIF ULNAR FRACTURE Left 01/04/2014  ? Procedure: OPEN REDUCTION INTERNAL FIXATION (ORIF) ULNAR FRACTURE;  Surgeon: Alta Corning, MD;  Location: [place];  Service: Orthopedics;  Laterality: Left;  ? PEG PLACEMENT N/A 01/12/2014  ? Procedure: PERCUTANEOUS ENDOSCOPIC GASTROSTOMY (PEG) PLACEMENT;  Surgeon: Gwenyth Ober, MD;  Location: [place];  Service: General;  Laterality: N/A;    bedside trach and peg  ? PEG TUBE REMOVAL  02/13/2015  ?  PERCUTANEOUS TRACHEOSTOMY N/A 01/12/2014  ? Procedure: PERCUTANEOUS TRACHEOSTOMY;  Surgeon: Gwenyth Ober, MD;  Location: [place];  Service: General;  Laterality: N/A;  BEDSIDE TRACH  ? TENDON TRANSFER Left 05/22/2017  ? Procedure: TRANSFER PROFUNDUS TO SUPERFICIALIS LENGTHENING LEFT FINGERS, PROFUNDUS FUNCTIONAL SUPERFUNTIONALIS WEAK NEURECTOMY ULNAR NERVE WRIST;  Surgeon: Daryll Brod, MD;  Location: [place];  Service: Orthopedics;  Laterality: Left;  ? TUBAL LIGATION  2003  ? ?Past Medical History:  ?Diagnosis Date  ? Complication of anesthesia   ? severe crying, hypersensitivity to pain  ? GERD (gastroesophageal reflux disease)   ? History of traumatic brain injury 01/04/2014  ? Hypertension   ? under control with med., has been on med. since 2015  ? Spasticity   ? legs, feet, left arm, left hand  ? Subluxation of left shoulder joint 01/04/2014  ? ongoing  ? Unable to walk   ? ?BP 96/69   Pulse 69   SpO2 98%  ? ?Opioid Risk Score:   ?Fall Risk Score:  `1 ? ?Depression screen PHQ 2/9 ? ? ?  12/22/2018  ? 11:08 AM 12/29/2017  ?  3:50 PM  ?Depression screen PHQ 2/9  ?Decreased Interest 0 0  ?Down, Depressed, Hopeless 0 0  ?PHQ - 2 Score 0 0  ?  ? ? ?Review of Systems  ?Musculoskeletal:  Positive for myalgias and neck pain.  ?     Spasms  ?Neurological:  Positive for weakness.  ?Psychiatric/Behavioral:  Positive for confusion. The patient is nervous/anxious.   ?All other systems reviewed and are negative. ? ?   ?Objective:  ? Physical Exam ? ?General: No acute distress ?HEENT: NCAT, EOMI, oral membranes moist ?Cards: reg rate  ?Chest: normal effort ?Abdomen: Soft, NT, ND ?Skin: dry, intact ?Extremities: no edema ?Psych: pleasant and appropriate  ?Neuro:Contractedt and eblow/shoulder LUE with underlying tone. --no changes today. Tolerateds minimal rom.   RLE: 3- to4 /4 right hamstring with increased hip flexor tightness and external rotation of the leg. RLE: ..remains in severe equinovarus position  bilaterally, can extend left knee to neutral.  feet are immovable.  ? remains unchanged.  improved attention and awareness. Impulsive with positive and negative ?Musc: tender left elbow and bilateral knees, let ankle  ?  ?  ?  ?   ?Assessment & Plan:   ?1. TBI with spastic tetraplegia:  ?-continued HEP as possible. HH PT , OT ?-S/P tendon lengthening and transfers left wrist/fingers.  ?.  ?Mobility Assessment ? ?[name] was seen today for the purpose of a mobility assessment for a powered wheelchair. She suffers from spastic tetraplegia related to a severe TBI. Due to her spastic weakness, Sarahjane is unable to utilize a cane, walker, manual wheelchair, or scooter. The patient is appropriate for a customized power wheelchair.  Marland Kitchen  With a power wheelchair   can move independently at a household level and on a limited basis in the community. The chair will also allow the patient to perform ADL's more easily. The patient is competent to operate the recommended chair on her own and is motivated to utilize the chair on a daily basis. ? ? ? ?  ?2. This patient also requires a personal care attendant at all times.   ?3.  Left elbow pain/contracture: continue HEP, daily stretching.  ?5.  Continue Dantrium at '50mg'$  qid --no RF needed today  ?7.  Constipation: soft/lax ?8. Anemia: recheck cbc, also needs stool sample checks ?9. Behavior: wean propranolol to off. Continue depakote '250mg'$  bid.  ?-seroquel '100mg'$  ?-  ?10. Pain relief: Tramadol or ibuprofen prn ?11. Insomnia: seroquel  '200mg'$  qhs with trazodone.  ?-doing fairly well at present most nights  ?  ?15 minutes of face to face patient care time were spent during this visit. All questions were encouraged and answered.  Follow up with me in 6 mos.  ? ? ? ?    ? ? ?

## 2022-03-18 LAB — DRUG TOX MONITOR 1 W/CONF, ORAL FLD
Amphetamines: NEGATIVE ng/mL (ref <10)
Barbiturates: NEGATIVE ng/mL (ref <10)
Benzodiazepines: NEGATIVE ng/mL (ref <0.50)
Buprenorphine: NEGATIVE ng/mL (ref <0.10)
Cocaine: NEGATIVE ng/mL (ref <5.0)
Fentanyl: NEGATIVE ng/mL (ref <0.10)
Heroin Metabolite: NEGATIVE ng/mL (ref <1.0)
MARIJUANA: NEGATIVE ng/mL (ref <2.5)
MDMA: NEGATIVE ng/mL (ref <10)
Meprobamate: NEGATIVE ng/mL (ref <2.5)
Methadone: NEGATIVE ng/mL (ref <5.0)
Nicotine Metabolite: NEGATIVE ng/mL (ref <5.0)
Opiates: NEGATIVE ng/mL (ref <2.5)
Phencyclidine: NEGATIVE ng/mL (ref <10)
Tapentadol: NEGATIVE ng/mL (ref <5.0)
Tramadol: 75.6 ng/mL — ABNORMAL HIGH (ref <5.0)
Tramadol: POSITIVE ng/mL — AB (ref <5.0)
Zolpidem: NEGATIVE ng/mL (ref <5.0)

## 2022-03-18 LAB — DRUG TOX ALC METAB W/CON, ORAL FLD

## 2022-03-19 ENCOUNTER — Telehealth: Payer: Self-pay | Admitting: *Deleted

## 2022-03-19 NOTE — Telephone Encounter (Signed)
Oral swab drug screen was consistent for prescribed medications.  ?

## 2022-03-21 ENCOUNTER — Telehealth: Payer: Self-pay | Admitting: *Deleted

## 2022-03-21 NOTE — Telephone Encounter (Addendum)
Mallory OT [facility] called for approval for POC 1wk2 and a visit with MSW.  Approval given. ?

## 2022-03-22 ENCOUNTER — Telehealth: Payer: Self-pay

## 2022-03-22 NOTE — Telephone Encounter (Signed)
Mallory called back to let Dr [name] know that Rishita's wheelchair is about 42 years old and has less than a mile on it.  The problem is that they have no way to charge it and the service contract is with another company now ( it was sold to another company) and they need to have someone come out and look at the wheelchair and see what needs to be done. She doesn't think they will pay for a whole new chair in less than 5 years. Mallory doesn't think there is anything she can do for Krystn but maybe an order can be placed for someone to go out from a wheelchair company and assess the situation. ?

## 2022-03-22 NOTE — Telephone Encounter (Signed)
Estill Bamberg from [facility] called to get a verbal approval for nursing to go to patient. She stated that OT noticed she has 2 open wounds on her buttocks and nursing needs to evaluate it. Verbal order given on confidential voicemail ?

## 2022-04-05 ENCOUNTER — Telehealth: Payer: Self-pay | Admitting: *Deleted

## 2022-04-05 ENCOUNTER — Encounter: Payer: Self-pay | Admitting: Physical Medicine & Rehabilitation

## 2022-04-05 NOTE — Telephone Encounter (Signed)
[facility] called for POC 1wk6.  Approval given. ?

## 2022-04-22 ENCOUNTER — Other Ambulatory Visit: Payer: Self-pay | Admitting: Physical Medicine & Rehabilitation

## 2022-05-21 ENCOUNTER — Other Ambulatory Visit: Payer: Self-pay | Admitting: Physical Medicine & Rehabilitation

## 2022-06-18 ENCOUNTER — Other Ambulatory Visit: Payer: Self-pay | Admitting: Physical Medicine & Rehabilitation

## 2022-06-18 DIAGNOSIS — R4689 Other symptoms and signs involving appearance and behavior: Secondary | ICD-10-CM

## 2022-06-18 DIAGNOSIS — Z87820 Personal history of traumatic brain injury: Secondary | ICD-10-CM

## 2022-08-13 ENCOUNTER — Other Ambulatory Visit: Payer: Self-pay | Admitting: Physical Medicine & Rehabilitation

## 2022-08-13 DIAGNOSIS — R4689 Other symptoms and signs involving appearance and behavior: Secondary | ICD-10-CM

## 2022-08-13 DIAGNOSIS — F5104 Psychophysiologic insomnia: Secondary | ICD-10-CM

## 2022-08-13 DIAGNOSIS — S069X0S Unspecified intracranial injury without loss of consciousness, sequela: Secondary | ICD-10-CM

## 2022-08-13 DIAGNOSIS — Z87820 Personal history of traumatic brain injury: Secondary | ICD-10-CM

## 2022-09-04 ENCOUNTER — Encounter: Payer: Self-pay | Admitting: Physical Medicine & Rehabilitation

## 2022-09-04 ENCOUNTER — Encounter: Payer: Medicare Other | Attending: Physical Medicine & Rehabilitation | Admitting: Physical Medicine & Rehabilitation

## 2022-09-04 VITALS — BP 99/67 | HR 71 | Ht 66.0 in

## 2022-09-04 DIAGNOSIS — G8250 Quadriplegia, unspecified: Secondary | ICD-10-CM | POA: Diagnosis present

## 2022-09-04 DIAGNOSIS — Z87820 Personal history of traumatic brain injury: Secondary | ICD-10-CM | POA: Diagnosis present

## 2022-09-04 MED ORDER — BACLOFEN 20 MG PO TABS: 20.0000 mg | ORAL_TABLET | Freq: Four times a day (QID) | ORAL | 6 refills | Status: DC

## 2022-09-04 NOTE — Patient Instructions (Addendum)
PLEASE FEEL FREE TO CALL OUR OFFICE WITH ANY PROBLEMS OR QUESTIONS ([phone])   DEPAKOTE: 1 PILL AT NIGHT FOR ONE MONTH THEN STOP IF YOU'RE NOT HAVING ANY PROBLEMS.

## 2022-09-04 NOTE — Progress Notes (Signed)
Subjective:    Patient ID: [name], female    DOB: Aug 02, 1981, 41 y.o.   MRN: [id number]  HPI  [name] is here in follow up of her TBI. She tells me that things are going well. She tells me with pride that her daughter got married and moved to [place] and has a child on the way. Her son just turned 59.   The family adopted an autistic child from her granddaughter who passed away.   They were unable to find a vendor who could replace parts on her w/c. Therefore she's in the same chair.    Pain Inventory Average Pain 3 Pain Right Now 1 My pain is constant, dull, and aching  LOCATION OF PAIN   LEFT SHOULDER, LEFT ARM, LEFT HIP, LEFT ANKLE, BOTH KNEES, BACK, BOTH FEET, LEFT HAND  BOWEL Number of stools per week: 3 Oral laxative use No  Type of laxative NONE Enema or suppository use Yes    BLADDER Pads    Mobility use a wheelchair needs help with transfers Do you have any goals in this area?  yes  Function disabled: date disabled 2015 I need assistance with the following:  feeding, dressing, bathing, toileting, meal prep, household duties, and shopping Do you have any goals in this area?  yes  Neuro/Psych weakness numbness tremor tingling trouble walking spasms dizziness confusion  Prior Studies Any changes since last visit?  no  Physicians involved in your care Any changes since last visit?  no   No family history on file. Social History   Socioeconomic History   Marital status: Divorced    Spouse name: Not on file   Number of children: Not on file   Years of education: Not on file   Highest education level: Not on file  Occupational History   Not on file  Tobacco Use   Smoking status: Former    Types: Cigarettes    Quit date: 01/04/2014    Years since quitting: 8.6   Smokeless tobacco: Never  Vaping Use   Vaping Use: Never used  Substance and Sexual Activity   Alcohol use: No   Drug use: No   Sexual activity: Not on file  Other  Topics Concern   Not on file  Social History Narrative   Not on file   Social Determinants of Health   Financial Resource Strain: Not on file  Food Insecurity: Not on file  Transportation Needs: Not on file  Physical Activity: Not on file  Stress: Not on file  Social Connections: Not on file   Past Surgical History:  Procedure Laterality Date   ORIF ELBOW FRACTURE Left 01/04/2014   Procedure: OPEN REDUCTION INTERNAL FIXATION (ORIF) ELBOW/OLECRANON FRACTURE;  Surgeon: Alta Corning, MD;  Location: [place];  Service: Orthopedics;  Laterality: Left;   ORIF HUMERUS FRACTURE Left 01/04/2014   Procedure: OPEN REDUCTION INTERNAL FIXATION (ORIF) HUMERAL SHAFT FRACTURE;  Surgeon: Alta Corning, MD;  Location: [place];  Service: Orthopedics;  Laterality: Left;   ORIF RADIAL FRACTURE Left 01/04/2014   Procedure: OPEN REDUCTION INTERNAL FIXATION (ORIF) RADIAL FRACTURE;  Surgeon: Alta Corning, MD;  Location: [place];  Service: Orthopedics;  Laterality: Left;   ORIF ULNAR FRACTURE Left 01/04/2014   Procedure: OPEN REDUCTION INTERNAL FIXATION (ORIF) ULNAR FRACTURE;  Surgeon: Alta Corning, MD;  Location: [place];  Service: Orthopedics;  Laterality: Left;   PEG PLACEMENT N/A 01/12/2014   Procedure: PERCUTANEOUS ENDOSCOPIC GASTROSTOMY (PEG) PLACEMENT;  Surgeon: Jeneen Rinks  Michael Boston, MD;  Location: [facility];  Service: General;  Laterality: N/A;    bedside trach and peg   PEG TUBE REMOVAL  02/13/2015   PERCUTANEOUS TRACHEOSTOMY N/A 01/12/2014   Procedure: PERCUTANEOUS TRACHEOSTOMY;  Surgeon: Gwenyth Ober, MD;  Location: [place];  Service: General;  Laterality: N/A;  BEDSIDE TRACH   TENDON TRANSFER Left 05/22/2017   Procedure: TRANSFER PROFUNDUS TO SUPERFICIALIS LENGTHENING LEFT FINGERS, PROFUNDUS FUNCTIONAL SUPERFUNTIONALIS WEAK NEURECTOMY ULNAR NERVE WRIST;  Surgeon: Daryll Brod, MD;  Location: [place];  Service: Orthopedics;  Laterality: Left;   TUBAL LIGATION  2003   Past Medical History:  Diagnosis  Date   Complication of anesthesia    severe crying, hypersensitivity to pain   GERD (gastroesophageal reflux disease)    History of traumatic brain injury 01/04/2014   Hypertension    under control with med., has been on med. since 2015   Spasticity    legs, feet, left arm, left hand   Subluxation of left shoulder joint 01/04/2014   ongoing   Unable to walk    BP 99/67   Pulse 71   Ht '5\' 6"'$  (1.676 m)   SpO2 98%   BMI 25.82 kg/m   Opioid Risk Score:   Fall Risk Score:  `1  Depression screen [facility] 2/9     12/22/2018   11:08 AM 12/29/2017    3:50 PM  Depression screen PHQ 2/9  Decreased Interest 0 0  Down, Depressed, Hopeless 0 0  PHQ - 2 Score 0 0    Review of Systems  Musculoskeletal:  Positive for gait problem.  Neurological:  Positive for dizziness, tremors, weakness and numbness.       SPASMS, TINGLING  Psychiatric/Behavioral:  Positive for confusion.   All other systems reviewed and are negative.      Objective:   Physical Exam  General: No acute distress HEENT: NCAT, EOMI, oral membranes moist Cards: reg rate  Chest: normal effort Abdomen: Soft, NT, ND Skin: dry, intact Extremities: no edema Psych: pleasant and appropriate  Neuro:Contractedt and eblow/shoulder LUE with underlying tone. --no changes today. Tolerateds minimal rom.   RLE: 3- to4 /4 right hamstring with increased hip flexor tightness and external rotation of the leg. RLE: ..remains in severe equinovarus position bilaterally, can extend left knee to neutral.  feet are unchanged. improved attention and awareness. Impulsive with positive and negative Musc: contracted LE's, LUE         Assessment & Plan:  1. TBI with spastic tetraplegia:  -HEP -S/P tendon lengthening and transfers left wrist/fingers.  -CONTINUE baclofen and dantrium  -check cmet today 2. This patient also requires a personal care attendant at all times.   3.  Left elbow pain/contracture: continue HEP.  5.  await w/c  mods 7.  Constipation: soft/lax 8. Anemia:   9. Behavior: seems improved. - Continue depakote '250mg'$  at hs for a month and if no problems then stop.   -seroquel '100mg'$  10. Pain relief: Tramadol or ibuprofen prn 11. Insomnia: seroquel  '200mg'$  qhs with trazodone.  -doing fairly well at present most nights   Twenty minutes of face to face patient care time were spent during this visit. All questions were encouraged and answered.  Follow up with me in 6 mos .

## 2022-09-05 ENCOUNTER — Telehealth (HOSPITAL_COMMUNITY): Payer: Self-pay | Admitting: Physical Medicine & Rehabilitation

## 2022-09-05 LAB — COMPREHENSIVE METABOLIC PANEL
ALT: 16 IU/L (ref 0–32)
AST: 18 IU/L (ref 0–40)
Albumin/Globulin Ratio: 1.9 (ref 1.2–2.2)
Albumin: 3.7 g/dL — ABNORMAL LOW (ref 3.9–4.9)
Alkaline Phosphatase: 49 IU/L (ref 44–121)
BUN/Creatinine Ratio: 81 — ABNORMAL HIGH (ref 9–23)
BUN: 22 mg/dL (ref 6–24)
Bilirubin Total: <0.2 mg/dL (ref 0.0–1.2)
CO2: 22 mmol/L (ref 20–29)
Calcium: 7.8 mg/dL — ABNORMAL LOW (ref 8.7–10.2)
Chloride: 107 mmol/L — ABNORMAL HIGH (ref 96–106)
Creatinine, Ser: 0.27 mg/dL — ABNORMAL LOW (ref 0.57–1.00)
Globulin, Total: 1.9 g/dL (ref 1.5–4.5)
Glucose: 84 mg/dL (ref 70–99)
Potassium: 4.4 mmol/L (ref 3.5–5.2)
Sodium: 141 mmol/L (ref 134–144)
Total Protein: 5.6 g/dL — ABNORMAL LOW (ref 6.0–8.5)
eGFR: 139 mL/min/{1.73_m2} (ref >59)

## 2022-09-05 NOTE — Telephone Encounter (Signed)
Notified. 

## 2022-09-05 NOTE — Telephone Encounter (Signed)
PLEASE LET MOM KNOW THAT LABS LOOK GOOD FROM YESTERDAY.  IF ANYTHING JUST PUSH PO INTAKE INCLUDING FLUIDS. THANKS!

## 2022-09-13 ENCOUNTER — Other Ambulatory Visit: Payer: Self-pay | Admitting: Physical Medicine & Rehabilitation

## 2022-12-05 ENCOUNTER — Other Ambulatory Visit: Payer: Self-pay | Admitting: Physical Medicine & Rehabilitation

## 2022-12-19 ENCOUNTER — Telehealth: Payer: Self-pay

## 2022-12-19 NOTE — Telephone Encounter (Signed)
[employer] will cover Tramadol '50MG'$  for 30 days with a 240 limit.  (Ref# [id number] ph# [phone]).

## 2022-12-20 MED ORDER — TRAMADOL HCL 50 MG PO TABS: ORAL_TABLET | ORAL | 3 refills | Status: DC

## 2022-12-20 NOTE — Telephone Encounter (Signed)
Rx written and sent to the pharmacy. Thanks!  

## 2023-02-11 ENCOUNTER — Other Ambulatory Visit: Payer: Self-pay | Admitting: Physical Medicine & Rehabilitation

## 2023-02-12 ENCOUNTER — Encounter: Payer: Self-pay | Admitting: Physical Medicine & Rehabilitation

## 2023-02-12 ENCOUNTER — Encounter: Payer: Medicare Other | Attending: Physical Medicine & Rehabilitation | Admitting: Physical Medicine & Rehabilitation

## 2023-02-12 VITALS — BP 102/71 | HR 70

## 2023-02-12 DIAGNOSIS — Z5181 Encounter for therapeutic drug level monitoring: Secondary | ICD-10-CM | POA: Diagnosis not present

## 2023-02-12 DIAGNOSIS — G894 Chronic pain syndrome: Secondary | ICD-10-CM | POA: Insufficient documentation

## 2023-02-12 DIAGNOSIS — G8250 Quadriplegia, unspecified: Secondary | ICD-10-CM | POA: Diagnosis not present

## 2023-02-12 DIAGNOSIS — R4689 Other symptoms and signs involving appearance and behavior: Secondary | ICD-10-CM | POA: Insufficient documentation

## 2023-02-12 DIAGNOSIS — F068 Other specified mental disorders due to known physiological condition: Secondary | ICD-10-CM | POA: Insufficient documentation

## 2023-02-12 DIAGNOSIS — S069X0S Unspecified intracranial injury without loss of consciousness, sequela: Secondary | ICD-10-CM | POA: Diagnosis not present

## 2023-02-12 DIAGNOSIS — Z87828 Personal history of other (healed) physical injury and trauma: Secondary | ICD-10-CM | POA: Insufficient documentation

## 2023-02-12 DIAGNOSIS — Z79891 Long term (current) use of opiate analgesic: Secondary | ICD-10-CM | POA: Diagnosis not present

## 2023-02-12 NOTE — Progress Notes (Signed)
Subjective:    Patient ID: [name], female    DOB: 04/08/1981, 42 y.o.   MRN: [id number]  HPI  [name] is here in follow up of her tbi and associated deficits. I last saw her in September. She says that she's been doing fairly well. Mom tried to wean vpa per my instructions but she didn't tolerate. She's back on bid dosing and doing well.   From a wheelchair standpoint there are literally duck taping the current 1 to keep it together.  She has a power chair which apparently was never completed.  Contractures in her left upper extremity and bilateral lower extremities are really unchanged.  Patient states that she does do some stretching on these areas.   Bowel or bladder function has been regular. Appetite has been good. Breathing has been normal without any coughing. Sleep has been regular, and the patient feels rested upon waking.  Mood is positive. No falls or mishaps have occurred at home. Pt hasn't had any skin breakdown or new wounds occur. Pain has been under control.  No recent illness or medical changes are reported either.      Pain Inventory Average Pain 3 Pain Right Now 3 My pain is dull and aching  LOCATION OF PAIN  elbow, wrist, hand, fingers, back, thigh, knee, leg, ankle, toes  BOWEL Number of stools per week: 2 Oral laxative use Yes  Type of laxative occasional  Enema or suppository use Yes  History of colostomy No  Incontinent No   BLADDER Pads In and out cath, frequency . Able to self cath  . Bladder incontinence Yes  Frequent urination No  Leakage with coughing No  Difficulty starting stream No  Incomplete bladder emptying No    Mobility how many minutes can you walk? 0 ability to climb steps?  no do you drive?  no use a wheelchair needs help with transfers  Function disabled: date disabled . I need assistance with the following:  bathing, toileting, meal prep, household duties, and shopping  Neuro/Psych bladder control  problems weakness numbness trouble walking confusion anxiety  Prior Studies Any changes since last visit?  no  Physicians involved in your care Any changes since last visit?  no   History reviewed. No pertinent family history. Social History   Socioeconomic History   Marital status: Divorced    Spouse name: Not on file   Number of children: Not on file   Years of education: Not on file   Highest education level: Not on file  Occupational History   Not on file  Tobacco Use   Smoking status: Former    Types: Cigarettes    Quit date: 01/04/2014    Years since quitting: 9.1   Smokeless tobacco: Never  Vaping Use   Vaping Use: Never used  Substance and Sexual Activity   Alcohol use: No   Drug use: No   Sexual activity: Not on file  Other Topics Concern   Not on file  Social History Narrative   Not on file   Social Determinants of Health   Financial Resource Strain: Not on file  Food Insecurity: Not on file  Transportation Needs: Not on file  Physical Activity: Not on file  Stress: Not on file  Social Connections: Not on file   Past Surgical History:  Procedure Laterality Date   ORIF ELBOW FRACTURE Left 01/04/2014   Procedure: OPEN REDUCTION INTERNAL FIXATION (ORIF) ELBOW/OLECRANON FRACTURE;  Surgeon: Alta Corning, MD;  Location: [facility]  OR;  Service: Orthopedics;  Laterality: Left;   ORIF HUMERUS FRACTURE Left 01/04/2014   Procedure: OPEN REDUCTION INTERNAL FIXATION (ORIF) HUMERAL SHAFT FRACTURE;  Surgeon: Alta Corning, MD;  Location: [place];  Service: Orthopedics;  Laterality: Left;   ORIF RADIAL FRACTURE Left 01/04/2014   Procedure: OPEN REDUCTION INTERNAL FIXATION (ORIF) RADIAL FRACTURE;  Surgeon: Alta Corning, MD;  Location: [place];  Service: Orthopedics;  Laterality: Left;   ORIF ULNAR FRACTURE Left 01/04/2014   Procedure: OPEN REDUCTION INTERNAL FIXATION (ORIF) ULNAR FRACTURE;  Surgeon: Alta Corning, MD;  Location: [place];  Service: Orthopedics;  Laterality: Left;    PEG PLACEMENT N/A 01/12/2014   Procedure: PERCUTANEOUS ENDOSCOPIC GASTROSTOMY (PEG) PLACEMENT;  Surgeon: Gwenyth Ober, MD;  Location: [place];  Service: General;  Laterality: N/A;    bedside trach and peg   PEG TUBE REMOVAL  02/13/2015   PERCUTANEOUS TRACHEOSTOMY N/A 01/12/2014   Procedure: PERCUTANEOUS TRACHEOSTOMY;  Surgeon: Gwenyth Ober, MD;  Location: [place];  Service: General;  Laterality: N/A;  BEDSIDE TRACH   TENDON TRANSFER Left 05/22/2017   Procedure: TRANSFER PROFUNDUS TO SUPERFICIALIS LENGTHENING LEFT FINGERS, PROFUNDUS FUNCTIONAL SUPERFUNTIONALIS WEAK NEURECTOMY ULNAR NERVE WRIST;  Surgeon: Daryll Brod, MD;  Location: [place];  Service: Orthopedics;  Laterality: Left;   TUBAL LIGATION  2003   Past Medical History:  Diagnosis Date   Complication of anesthesia    severe crying, hypersensitivity to pain   GERD (gastroesophageal reflux disease)    History of traumatic brain injury 01/04/2014   Hypertension    under control with med., has been on med. since 2015   Spasticity    legs, feet, left arm, left hand   Subluxation of left shoulder joint 01/04/2014   ongoing   Unable to walk    BP 102/71   Pulse 70   SpO2 99%   Opioid Risk Score:   Fall Risk Score:  `1  Depression screen [facility] 2/9     09/04/2022    9:52 AM 12/22/2018   11:08 AM 12/29/2017    3:50 PM  Depression screen PHQ 2/9  Decreased Interest 0 0 0  Down, Depressed, Hopeless 0 0 0  PHQ - 2 Score 0 0 0     Review of Systems  Musculoskeletal:        Spasms  Neurological:  Positive for weakness and numbness. Negative for dizziness.  Psychiatric/Behavioral:  Positive for confusion. The patient is nervous/anxious.   All other systems reviewed and are negative.     Objective:   Physical Exam General: No acute distress HEENT: NCAT, EOMI, oral membranes moist Cards: reg rate  Chest: normal effort Abdomen: Soft, NT, ND Skin: dry, intact Extremities: no edema Psych: pleasant and  appropriate  Neuro: Contractedt and eblow/shoulder LUE with underlying tone. --no changes today--does tolerate movement a bit better. Tolerateds minimal rom.   RLE: 3- to4 /4 right hamstring with increased hip flexor tightness and external rotation of the leg. RLE--foot contracted: in severe equinovarus position bilaterally, can extend left knee to neutral. With sl better foot rom.. improved attention and awareness. Impulsive with positive and negative Musc: contracted LE's, LUE         Assessment & Plan:  1. TBI with spastic tetraplegia:  -HEP--HH PT, OT revisit.  -Discussed home stretching program with patient.  She is tolerating some passive range of motion a bit better. -S/P tendon lengthening and transfers left wrist/fingers.  -CONTINUE baclofen and dantrium             -  2. This patient also requires a personal care attendant at all times.   3.  Left elbow pain/contracture: continue HEP.  7.  Constipation: soft/lax 8. Anemia:   9. Behavior: seems improved. - Continue depakote '250mg'$  bid for long term   -seroquel '100mg'$  10. Pain relief: Tramadol or ibuprofen prn  -drug swab today 11. Insomnia: seroquel  '200mg'$  qhs with trazodone.  12. W/c in disrepair. Needs replacement. Asked mom to discuss with vendor as to which steps we should take.   -hh w/c eval? Vs outpt      Twenty minutes of face to face patient care time were spent during this visit. All questions were encouraged and answered.  Follow up with me in 6 mos .

## 2023-02-12 NOTE — Patient Instructions (Signed)
ALWAYS FEEL FREE TO CALL OUR OFFICE WITH ANY PROBLEMS OR QUESTIONS ([phone])  **PLEASE NOTE** ALL MEDICATION REFILL REQUESTS (INCLUDING CONTROLLED SUBSTANCES) NEED TO BE MADE AT LEAST 7 DAYS PRIOR TO REFILL BEING DUE. ANY REFILL REQUESTS INSIDE THAT TIME FRAME MAY RESULT IN DELAYS IN RECEIVING YOUR PRESCRIPTION.                    

## 2023-02-15 LAB — DRUG TOX MONITOR 1 W/CONF, ORAL FLD
Alprazolam: NEGATIVE ng/mL (ref <0.50)
Amphetamines: NEGATIVE ng/mL (ref <10)
Barbiturates: NEGATIVE ng/mL (ref <10)
Benzodiazepines: POSITIVE ng/mL — AB (ref <0.50)
Buprenorphine: NEGATIVE ng/mL (ref <0.10)
Chlordiazepoxide: NEGATIVE ng/mL (ref <0.50)
Clonazepam: NEGATIVE ng/mL (ref <0.50)
Cocaine: NEGATIVE ng/mL (ref <5.0)
Diazepam: NEGATIVE ng/mL (ref <0.50)
Fentanyl: NEGATIVE ng/mL (ref <0.10)
Flunitrazepam: NEGATIVE ng/mL (ref <0.50)
Flurazepam: NEGATIVE ng/mL (ref <0.50)
Heroin Metabolite: NEGATIVE ng/mL (ref <1.0)
Lorazepam: 1.01 ng/mL — ABNORMAL HIGH (ref <0.50)
MARIJUANA: NEGATIVE ng/mL (ref <2.5)
MDMA: NEGATIVE ng/mL (ref <10)
Meprobamate: NEGATIVE ng/mL (ref <2.5)
Methadone: NEGATIVE ng/mL (ref <5.0)
Midazolam: NEGATIVE ng/mL (ref <0.50)
Nicotine Metabolite: NEGATIVE ng/mL (ref <5.0)
Nordiazepam: NEGATIVE ng/mL (ref <0.50)
Opiates: NEGATIVE ng/mL (ref <2.5)
Oxazepam: NEGATIVE ng/mL (ref <0.50)
Phencyclidine: NEGATIVE ng/mL (ref <10)
Tapentadol: NEGATIVE ng/mL (ref <5.0)
Temazepam: NEGATIVE ng/mL (ref <0.50)
Tramadol: >500 ng/mL — ABNORMAL HIGH (ref <5.0)
Tramadol: POSITIVE ng/mL — AB (ref <5.0)
Triazolam: NEGATIVE ng/mL (ref <0.50)
Zolpidem: NEGATIVE ng/mL (ref <5.0)

## 2023-02-15 LAB — DRUG TOX ALC METAB W/CON, ORAL FLD: Alcohol Metabolite: NEGATIVE ng/mL (ref <25)

## 2023-02-20 ENCOUNTER — Telehealth: Payer: Self-pay | Admitting: *Deleted

## 2023-02-20 NOTE — Telephone Encounter (Signed)
Oral swab drug screen was consistent for prescribed medications.  ?

## 2023-03-06 ENCOUNTER — Telehealth: Payer: Self-pay | Admitting: *Deleted

## 2023-03-06 DIAGNOSIS — G4700 Insomnia, unspecified: Secondary | ICD-10-CM | POA: Diagnosis not present

## 2023-03-06 DIAGNOSIS — F068 Other specified mental disorders due to known physiological condition: Secondary | ICD-10-CM | POA: Diagnosis not present

## 2023-03-06 DIAGNOSIS — K219 Gastro-esophageal reflux disease without esophagitis: Secondary | ICD-10-CM | POA: Diagnosis not present

## 2023-03-06 DIAGNOSIS — G8250 Quadriplegia, unspecified: Secondary | ICD-10-CM | POA: Diagnosis not present

## 2023-03-06 DIAGNOSIS — Z87891 Personal history of nicotine dependence: Secondary | ICD-10-CM | POA: Diagnosis not present

## 2023-03-06 DIAGNOSIS — D649 Anemia, unspecified: Secondary | ICD-10-CM | POA: Diagnosis not present

## 2023-03-06 DIAGNOSIS — K5900 Constipation, unspecified: Secondary | ICD-10-CM | POA: Diagnosis not present

## 2023-03-06 DIAGNOSIS — S069X0S Unspecified intracranial injury without loss of consciousness, sequela: Secondary | ICD-10-CM | POA: Diagnosis not present

## 2023-03-06 DIAGNOSIS — I10 Essential (primary) hypertension: Secondary | ICD-10-CM | POA: Diagnosis not present

## 2023-03-06 DIAGNOSIS — G894 Chronic pain syndrome: Secondary | ICD-10-CM | POA: Diagnosis not present

## 2023-03-06 NOTE — Telephone Encounter (Signed)
[name] called to report PT eval was completed and [name] is at baseline.  The mother would like  1) a referral to outpt therapy @ [facility] in [place] therapy clinic in [place], [place] Address: [address], [place],  [address] Phone: [phone]  2) [name] says the mother is requesting an eval by OT/ST.  3) She also requests a new motorized w/c ( vendor out of business) and hospital bed as well.  [name] also needs to know if there are any restrictions w/hip dysplasia and pt c/o chronic L shoulder subluxation. I have let her know that Dr [name] is out of the office until Monday and will respond at that time.  [name] call back is [phone]

## 2023-03-10 ENCOUNTER — Encounter: Payer: Self-pay | Admitting: *Deleted

## 2023-03-10 NOTE — Telephone Encounter (Signed)
Message sent to [facility] mother with information from Dr [name].

## 2023-03-11 ENCOUNTER — Other Ambulatory Visit: Payer: Self-pay | Admitting: Physical Medicine & Rehabilitation

## 2023-03-12 ENCOUNTER — Other Ambulatory Visit: Payer: Self-pay | Admitting: Physical Medicine & Rehabilitation

## 2023-03-13 NOTE — Telephone Encounter (Signed)
[name] from [facility] wants to clarify if you wanted to discharge [name] from [facility] care since he is doing the outpatient rehab

## 2023-03-14 ENCOUNTER — Other Ambulatory Visit: Payer: Self-pay | Admitting: Physical Medicine & Rehabilitation

## 2023-03-14 NOTE — Telephone Encounter (Signed)
Spoke w/ [name] to advise it was okay to discharge from [facility] per Dr Riley Kill

## 2023-05-07 ENCOUNTER — Other Ambulatory Visit: Payer: Self-pay | Admitting: Physical Medicine & Rehabilitation

## 2023-05-26 ENCOUNTER — Other Ambulatory Visit: Payer: Self-pay | Admitting: Physical Medicine & Rehabilitation

## 2023-05-26 DIAGNOSIS — Z87820 Personal history of traumatic brain injury: Secondary | ICD-10-CM

## 2023-05-26 DIAGNOSIS — S069X0S Unspecified intracranial injury without loss of consciousness, sequela: Secondary | ICD-10-CM

## 2023-06-09 ENCOUNTER — Other Ambulatory Visit: Payer: Self-pay | Admitting: Physical Medicine & Rehabilitation

## 2023-06-20 ENCOUNTER — Other Ambulatory Visit: Payer: Self-pay | Admitting: Physical Medicine & Rehabilitation

## 2023-06-20 DIAGNOSIS — F5104 Psychophysiologic insomnia: Secondary | ICD-10-CM

## 2023-06-20 DIAGNOSIS — R4689 Other symptoms and signs involving appearance and behavior: Secondary | ICD-10-CM

## 2023-06-20 DIAGNOSIS — S069X0S Unspecified intracranial injury without loss of consciousness, sequela: Secondary | ICD-10-CM

## 2023-06-20 DIAGNOSIS — Z87820 Personal history of traumatic brain injury: Secondary | ICD-10-CM

## 2023-06-24 NOTE — Telephone Encounter (Signed)
  Instructions   Return in about 6 months (around 08/15/2023).

## 2023-08-13 ENCOUNTER — Encounter: Payer: Self-pay | Admitting: Physical Medicine & Rehabilitation

## 2023-08-13 ENCOUNTER — Encounter: Payer: Medicare Other | Attending: Physical Medicine & Rehabilitation | Admitting: Physical Medicine & Rehabilitation

## 2023-08-13 DIAGNOSIS — Z87820 Personal history of traumatic brain injury: Secondary | ICD-10-CM | POA: Diagnosis not present

## 2023-08-13 DIAGNOSIS — R4689 Other symptoms and signs involving appearance and behavior: Secondary | ICD-10-CM | POA: Insufficient documentation

## 2023-08-13 DIAGNOSIS — S069X0S Unspecified intracranial injury without loss of consciousness, sequela: Secondary | ICD-10-CM | POA: Insufficient documentation

## 2023-08-13 MED ORDER — LORAZEPAM 0.5 MG PO TABS: ORAL_TABLET | ORAL | 3 refills | Status: DC

## 2023-08-13 MED ORDER — CARBAMAZEPINE 200 MG PO TABS: 200.0000 mg | ORAL_TABLET | Freq: Four times a day (QID) | ORAL | 6 refills | Status: DC

## 2023-08-13 MED ORDER — CITALOPRAM HYDROBROMIDE 40 MG PO TABS: 40.0000 mg | ORAL_TABLET | Freq: Every day | ORAL | 6 refills | Status: DC

## 2023-08-13 MED ORDER — DIVALPROEX SODIUM 250 MG PO DR TAB
250.0000 mg | DELAYED_RELEASE_TABLET | Freq: Two times a day (BID) | ORAL | 3 refills | Status: DC
Start: 2023-08-13 — End: 2023-10-03

## 2023-08-13 NOTE — Progress Notes (Signed)
Subjective:    Patient ID: [name], female    DOB: 1981/09/30, 42 y.o.   MRN: [id number]  HPI  Tenile is here in follow up of her TBI. She has been doing fairly well. Therapy was out in the spring and not much was done with this referral. Jovan does some stretching on her own at home and has worked to achieve better extension of her left knee. She continues to have an aid who helps during the day. She remains on dantrium, baclofen for her tone. She uses tramadol and diclofenac for pain. She remains on tegretol, celexa, ativan, depakote, and seroquel for mood control.   They have had some repairs to her powered chair. Her manual chair needs repair also and really needs to be replaced. They have been in contact with vendor about what can be replaced or fixed. [employer].   Behaviorally Avneet has been near baseline. She seems to get along well with friends and family. She is happy that she will have a new grandbaby on the way.   Pain Inventory Average Pain 2 Pain Right Now 1 My pain is sharp, burning, dull, stabbing, tingling, and aching  In the last 24 hours, has pain interfered with the following? General activity 0 Relation with others 0 Enjoyment of life 0 What TIME of day is your pain at its worst? evening Sleep (in general) Fair  Pain is worse with: sitting and inactivity Pain improves with: rest, heat/ice, therapy/exercise, and medication Relief from Meds: 4  History reviewed. No pertinent family history. Social History   Socioeconomic History   Marital status: Divorced    Spouse name: Not on file   Number of children: Not on file   Years of education: Not on file   Highest education level: Not on file  Occupational History   Not on file  Tobacco Use   Smoking status: Former    Current packs/day: 0.00    Types: Cigarettes    Quit date: 01/04/2014    Years since quitting: 9.6   Smokeless tobacco: Never  Vaping Use   Vaping status: Never  Used  Substance and Sexual Activity   Alcohol use: No   Drug use: No   Sexual activity: Not on file  Other Topics Concern   Not on file  Social History Narrative   Not on file   Social Determinants of Health   Financial Resource Strain: Not on file  Food Insecurity: Not on file  Transportation Needs: Not on file  Physical Activity: Not on file  Stress: Not on file  Social Connections: Not on file   Past Surgical History:  Procedure Laterality Date   ORIF ELBOW FRACTURE Left 01/04/2014   Procedure: OPEN REDUCTION INTERNAL FIXATION (ORIF) ELBOW/OLECRANON FRACTURE;  Surgeon: Harvie Junior, MD;  Location: [facility];  Service: Orthopedics;  Laterality: Left;   ORIF HUMERUS FRACTURE Left 01/04/2014   Procedure: OPEN REDUCTION INTERNAL FIXATION (ORIF) HUMERAL SHAFT FRACTURE;  Surgeon: Harvie Junior, MD;  Location: [facility];  Service: Orthopedics;  Laterality: Left;   ORIF RADIAL FRACTURE Left 01/04/2014   Procedure: OPEN REDUCTION INTERNAL FIXATION (ORIF) RADIAL FRACTURE;  Surgeon: Harvie Junior, MD;  [facility]: [facility];  Service: Orthopedics;  Laterality: Left;   ORIF ULNAR FRACTURE Left 01/04/2014   Procedure: OPEN REDUCTION INTERNAL FIXATION (ORIF) ULNAR FRACTURE;  Surgeon: Harvie Junior, MD;  [facility]: [facility];  Service: Orthopedics;  Laterality: Left;   PEG PLACEMENT N/A 01/12/2014  Procedure: PERCUTANEOUS ENDOSCOPIC GASTROSTOMY (PEG) PLACEMENT;  Surgeon: Cherylynn Ridges, MD;  Location: [facility] ENDOSCOPY;  Service: General;  Laterality: N/A;    bedside trach and peg   PEG TUBE REMOVAL  02/13/2015   PERCUTANEOUS TRACHEOSTOMY N/A 01/12/2014   Procedure: PERCUTANEOUS TRACHEOSTOMY;  Surgeon: Cherylynn Ridges, MD;  [facility]: [facility];  Service: General;  Laterality: N/A;  BEDSIDE TRACH   TENDON TRANSFER Left 05/22/2017   Procedure: TRANSFER PROFUNDUS TO SUPERFICIALIS LENGTHENING LEFT FINGERS, PROFUNDUS FUNCTIONAL SUPERFUNTIONALIS WEAK NEURECTOMY ULNAR NERVE WRIST;  Surgeon: Cindee Salt, MD;  Location: [facility];  Service: Orthopedics;  Laterality: Left;   TUBAL LIGATION  2003   Past Surgical History:  Procedure Laterality Date   ORIF ELBOW FRACTURE Left 01/04/2014   Procedure: OPEN REDUCTION INTERNAL FIXATION (ORIF) ELBOW/OLECRANON FRACTURE;  Surgeon: Harvie Junior, MD;  [facility];  Service: Orthopedics;  Laterality: Left;   ORIF HUMERUS FRACTURE Left 01/04/2014   Procedure: OPEN REDUCTION INTERNAL FIXATION (ORIF) HUMERAL SHAFT FRACTURE;  Surgeon: Harvie Junior, MD;  [facility];  Service: Orthopedics;  Laterality: Left;   ORIF RADIAL FRACTURE Left 01/04/2014   Procedure: OPEN REDUCTION INTERNAL FIXATION (ORIF) RADIAL FRACTURE;  Surgeon: Harvie Junior, MD;  [facility];  Service: Orthopedics;  Laterality: Left;   ORIF ULNAR FRACTURE Left 01/04/2014   Procedure: OPEN REDUCTION INTERNAL FIXATION (ORIF) ULNAR FRACTURE;  Surgeon: Harvie Junior, MD;  [facility];  Service: Orthopedics;  Laterality: Left;   PEG PLACEMENT N/A 01/12/2014   Procedure: PERCUTANEOUS ENDOSCOPIC GASTROSTOMY (PEG) PLACEMENT;  Surgeon: Cherylynn Ridges, MD;  Location: [facility] ENDOSCOPY;  Service: General;  Laterality: N/A;    bedside trach and peg   PEG TUBE REMOVAL  02/13/2015   PERCUTANEOUS TRACHEOSTOMY N/A 01/12/2014   Procedure: PERCUTANEOUS TRACHEOSTOMY;  Surgeon: Cherylynn Ridges, MD;  [facility];  Service: General;  Laterality: N/A;  BEDSIDE TRACH   TENDON TRANSFER Left 05/22/2017   Procedure: TRANSFER PROFUNDUS TO SUPERFICIALIS LENGTHENING LEFT FINGERS, PROFUNDUS FUNCTIONAL SUPERFUNTIONALIS WEAK NEURECTOMY ULNAR NERVE WRIST;  Surgeon: Cindee Salt, MD;  Location: [facility];  Service: Orthopedics;  Laterality: Left;   TUBAL LIGATION  2003   Past Medical History:  Diagnosis Date   Complication of anesthesia    severe crying, hypersensitivity to pain   GERD (gastroesophageal reflux disease)    History of traumatic brain injury 01/04/2014   Hypertension    under control with med., has been on med.  since 2015   Spasticity    legs, feet, left arm, left hand   Subluxation of left shoulder joint 01/04/2014   ongoing   Unable to walk    BP 124/83   Pulse 65   Ht 5\' 6"  (1.676 m)   SpO2 95%   BMI 25.82 kg/m   Opioid Risk Score:   Fall Risk Score:  `1  Depression screen [facility] 2/9     08/13/2023   10:50 AM 09/04/2022    9:52 AM 12/22/2018   11:08 AM 12/29/2017    3:50 PM  Depression screen PHQ 2/9  Decreased Interest 0 0 0 0  Down, Depressed, Hopeless 0 0 0 0  PHQ - 2 Score 0 0 0 0      Review of Systems  Musculoskeletal:  Positive for back pain and gait problem.       B/L knee pain Left Arm pain Left hip pain   All other systems reviewed and are negative.  Objective:   Physical Exam General: No acute distress HEENT: NCAT, EOMI, oral membranes moist Cards: reg rate  Chest: normal effort Abdomen: Soft, NT, ND Skin: dry, intact Extremities: no edema Psych: pleasant and appropriate  Neuro: Still with contracted and eblow/shoulder LUE with underlying tone--still struggles to tolerate more than 120 extension.   RLE: 3- to4 /4 right hamstring with increased hip flexor tightness and external rotation of the leg. RLE--foot contracted: in severe equinovarus position bilaterally, can extend left knee to near neutral. With sl better foot rom.. improved attention and awareness. Impulsive with positive and negative Musc: contracted LE's, LUE         Assessment & Plan:  1. TBI with spastic tetraplegia:  -will refer to outpt therapy with neuro-rehab to assess ROM, mobility, ?w/c assessment -HEP as always -S/P tendon lengthening and transfers left wrist/fingers.  -CONTINUE baclofen and dantrium             -  2. This patient also requires a personal care attendant at all times.   3.  Left elbow pain/contracture: continue HEP.  7.  Constipation: soft/lax 8. Anemia:   9. Behavior: overall much improved. - Continue depakote 250mg  bid for long term   -seroquel 200mg  at  bedtime -celexa 40mg  at bedtime 10. Pain relief: Tramadol or ibuprofen prn             -controlled for most part 11. Insomnia: seroquel  200mg  qhs with trazodone.  12. W/c in disrepair. Needs replacement. Asked mom to discuss with vendor as to which steps we should take.              -per above. Will ask neuro-rehab to potentially assist with w/c assessment-replacement       Twenty minutes of face to face patient care time were spent during this visit. All questions were encouraged and answered.  Follow up with me in 6 mos .

## 2023-08-13 NOTE — Patient Instructions (Signed)
ALWAYS FEEL FREE TO CALL OUR OFFICE WITH ANY PROBLEMS OR QUESTIONS ([phone])  **PLEASE NOTE** ALL MEDICATION REFILL REQUESTS (INCLUDING CONTROLLED SUBSTANCES) NEED TO BE MADE AT LEAST 7 DAYS PRIOR TO REFILL BEING DUE. ANY REFILL REQUESTS INSIDE THAT TIME FRAME MAY RESULT IN DELAYS IN RECEIVING YOUR PRESCRIPTION.                    

## 2023-08-20 ENCOUNTER — Ambulatory Visit: Payer: Medicare Other

## 2023-08-21 ENCOUNTER — Other Ambulatory Visit: Payer: Self-pay | Admitting: Physical Medicine & Rehabilitation

## 2023-09-01 ENCOUNTER — Ambulatory Visit: Payer: Medicare Other | Attending: Physical Medicine & Rehabilitation

## 2023-09-01 DIAGNOSIS — R293 Abnormal posture: Secondary | ICD-10-CM | POA: Insufficient documentation

## 2023-09-01 DIAGNOSIS — Z87820 Personal history of traumatic brain injury: Secondary | ICD-10-CM | POA: Insufficient documentation

## 2023-09-01 DIAGNOSIS — R262 Difficulty in walking, not elsewhere classified: Secondary | ICD-10-CM | POA: Diagnosis not present

## 2023-09-01 DIAGNOSIS — R278 Other lack of coordination: Secondary | ICD-10-CM | POA: Diagnosis not present

## 2023-09-01 DIAGNOSIS — M6281 Muscle weakness (generalized): Secondary | ICD-10-CM | POA: Insufficient documentation

## 2023-09-01 DIAGNOSIS — R4689 Other symptoms and signs involving appearance and behavior: Secondary | ICD-10-CM | POA: Insufficient documentation

## 2023-09-01 DIAGNOSIS — S069X0S Unspecified intracranial injury without loss of consciousness, sequela: Secondary | ICD-10-CM | POA: Insufficient documentation
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
## 2023-09-15 ENCOUNTER — Ambulatory Visit: Payer: Medicare Other | Admitting: Physical Therapy

## 2023-09-17 ENCOUNTER — Ambulatory Visit: Payer: Medicare Other

## 2023-09-22 ENCOUNTER — Ambulatory Visit: Payer: Medicare Other | Admitting: Physical Therapy

## 2023-09-24 ENCOUNTER — Ambulatory Visit: Payer: Medicare Other

## 2023-09-29 ENCOUNTER — Ambulatory Visit: Payer: Medicare Other | Admitting: Physical Therapy

## 2023-09-29 ENCOUNTER — Ambulatory Visit: Payer: Medicare Other

## 2023-10-02 ENCOUNTER — Other Ambulatory Visit: Payer: Self-pay | Admitting: Physical Medicine & Rehabilitation

## 2023-10-02 ENCOUNTER — Ambulatory Visit: Payer: Medicare Other

## 2023-10-02 DIAGNOSIS — S069XAS Unspecified intracranial injury with loss of consciousness status unknown, sequela: Secondary | ICD-10-CM

## 2023-10-02 DIAGNOSIS — Z87820 Personal history of traumatic brain injury: Secondary | ICD-10-CM

## 2023-10-06 ENCOUNTER — Ambulatory Visit: Payer: Medicare Other | Admitting: Physical Therapy

## 2023-10-06 ENCOUNTER — Ambulatory Visit: Payer: Medicare Other

## 2023-10-08 ENCOUNTER — Ambulatory Visit: Payer: Medicare Other

## 2023-10-08 ENCOUNTER — Ambulatory Visit: Payer: Medicare Other | Admitting: Physical Therapy

## 2023-10-13 ENCOUNTER — Ambulatory Visit: Payer: Medicare Other | Admitting: Physical Therapy

## 2023-10-15 ENCOUNTER — Ambulatory Visit: Payer: Medicare Other | Admitting: Physical Therapy

## 2023-10-20 ENCOUNTER — Ambulatory Visit: Payer: Medicare Other

## 2023-10-22 ENCOUNTER — Ambulatory Visit: Payer: Medicare Other | Admitting: Physical Therapy

## 2023-10-22 ENCOUNTER — Ambulatory Visit: Payer: Medicare Other

## 2023-11-20 ENCOUNTER — Other Ambulatory Visit: Payer: Self-pay | Admitting: Physical Medicine & Rehabilitation

## 2023-11-24 ENCOUNTER — Other Ambulatory Visit: Payer: Self-pay | Admitting: Physical Medicine & Rehabilitation

## 2023-12-23 ENCOUNTER — Other Ambulatory Visit: Payer: Self-pay | Admitting: Physical Medicine & Rehabilitation

## 2024-01-14 ENCOUNTER — Other Ambulatory Visit: Payer: Self-pay | Admitting: Physical Medicine & Rehabilitation

## 2024-01-14 DIAGNOSIS — Z87820 Personal history of traumatic brain injury: Secondary | ICD-10-CM

## 2024-01-14 DIAGNOSIS — F5104 Psychophysiologic insomnia: Secondary | ICD-10-CM

## 2024-01-14 DIAGNOSIS — S069XAS Unspecified intracranial injury with loss of consciousness status unknown, sequela: Secondary | ICD-10-CM

## 2024-01-27 ENCOUNTER — Other Ambulatory Visit: Payer: Self-pay | Admitting: Physical Medicine & Rehabilitation

## 2024-02-16 ENCOUNTER — Other Ambulatory Visit: Payer: Self-pay | Admitting: Physical Medicine & Rehabilitation

## 2024-02-18 ENCOUNTER — Encounter: Payer: Self-pay | Admitting: Physical Medicine & Rehabilitation

## 2024-02-18 ENCOUNTER — Other Ambulatory Visit: Payer: Self-pay | Admitting: Physical Medicine & Rehabilitation

## 2024-02-18 ENCOUNTER — Encounter: Payer: Medicare Other | Attending: Physical Medicine & Rehabilitation | Admitting: Physical Medicine & Rehabilitation

## 2024-02-18 VITALS — BP 102/70 | HR 73 | Wt 160.0 lb

## 2024-02-18 DIAGNOSIS — S069XAS Unspecified intracranial injury with loss of consciousness status unknown, sequela: Secondary | ICD-10-CM | POA: Diagnosis not present

## 2024-02-18 DIAGNOSIS — Z87820 Personal history of traumatic brain injury: Secondary | ICD-10-CM | POA: Insufficient documentation

## 2024-02-18 DIAGNOSIS — G8250 Quadriplegia, unspecified: Secondary | ICD-10-CM | POA: Insufficient documentation

## 2024-02-18 DIAGNOSIS — R4689 Other symptoms and signs involving appearance and behavior: Secondary | ICD-10-CM | POA: Insufficient documentation

## 2024-02-18 NOTE — Patient Instructions (Signed)
 ALWAYS FEEL FREE TO CALL OUR OFFICE WITH ANY PROBLEMS OR QUESTIONS [phone])  **PLEASE NOTE** ALL MEDICATION REFILL REQUESTS (INCLUDING CONTROLLED SUBSTANCES) NEED TO BE MADE AT LEAST 7 DAYS PRIOR TO REFILL BEING DUE. ANY REFILL REQUESTS INSIDE THAT TIME FRAME MAY RESULT IN DELAYS IN RECEIVING YOUR PRESCRIPTION.

## 2024-02-18 NOTE — Progress Notes (Signed)
 Subjective:    Patient ID: [name], female    DOB: Apr 10, 1981, 43 y.o.   MRN: [id number]  HPI  Nour is here in follow up of her TBI and associated deficits. She deals with ongoing contractures and spasticity. Last week she developed some pain in her left knee after being mis-positioned for a few hours. She took some ibuprofen which seems to have helped. Family works on ROM at home to [employer] tolerance. She has been needing some more help with transfers and mobility. Family is interested in more therapy.   Mood has been under control. She has her outbursts but thery're limited. Family knows when to push and when not to. Her mood has been generally postive. Meds remain unchanged.     Pain Inventory Average Pain 7 Pain Right Now 0 My pain is aching and soreness  In the last 24 hours, has pain interfered with the following? General activity 0 Relation with others 0 Enjoyment of life 0 What TIME of day is your pain at its worst? varies Sleep (in general) Good  Pain is worse with:  leg was hung off pillow and touch the rail for about 4 hours and mom believes that is where the pain i s coming from Pain improves with: therapy/exercise and medication Relief from Meds: 9  History reviewed. No pertinent family history. Social History   Socioeconomic History   Marital status: Divorced    Spouse name: Not on file   Number of children: Not on file   Years of education: Not on file   Highest education level: Not on file  Occupational History   Not on file  Tobacco Use   Smoking status: Former    Current packs/day: 0.00    Types: Cigarettes    Quit date: 01/04/2014    Years since quitting: 10.1   Smokeless tobacco: Never  Vaping Use   Vaping status: Never Used  Substance and Sexual Activity   Alcohol use: No   Drug use: No   Sexual activity: Not on file  Other Topics Concern   Not on file  Social History Narrative   Not on file   Social Drivers of Health    Financial Resource Strain: Not on file  Food Insecurity: Not on file  Transportation Needs: Not on file  Physical Activity: Not on file  Stress: Not on file  Social Connections: Not on file   Past Surgical History:  Procedure Laterality Date   ORIF ELBOW FRACTURE Left 01/04/2014   Procedure: OPEN REDUCTION INTERNAL FIXATION (ORIF) ELBOW/OLECRANON FRACTURE;  Surgeon: Harvie Junior, MD;  [facility];  Service: Orthopedics;  Laterality: Left;   ORIF HUMERUS FRACTURE Left 01/04/2014   Procedure: OPEN REDUCTION INTERNAL FIXATION (ORIF) HUMERAL SHAFT FRACTURE;  Surgeon: Harvie Junior, MD;  [facility];  Service: Orthopedics;  Laterality: Left;   ORIF RADIAL FRACTURE Left 01/04/2014   Procedure: OPEN REDUCTION INTERNAL FIXATION (ORIF) RADIAL FRACTURE;  Surgeon: Harvie Junior, MD;  [facility];  Service: Orthopedics;  Laterality: Left;   ORIF ULNAR FRACTURE Left 01/04/2014   Procedure: OPEN REDUCTION INTERNAL FIXATION (ORIF) ULNAR FRACTURE;  Surgeon: Harvie Junior, MD;  [facility];  Service: Orthopedics;  Laterality: Left;   PEG PLACEMENT N/A 01/12/2014   Procedure: PERCUTANEOUS ENDOSCOPIC GASTROSTOMY (PEG) PLACEMENT;  Surgeon: Cherylynn Ridges, MD;  Location: [facility] ENDOSCOPY;  Service: General;  Laterality: N/A;    bedside trach and peg   PEG TUBE REMOVAL  02/13/2015  PERCUTANEOUS TRACHEOSTOMY N/A 01/12/2014   Procedure: PERCUTANEOUS TRACHEOSTOMY;  Surgeon: Cherylynn Ridges, MD;  [facility];  Service: General;  Laterality: N/A;  BEDSIDE TRACH   TENDON TRANSFER Left 05/22/2017   Procedure: TRANSFER PROFUNDUS TO SUPERFICIALIS LENGTHENING LEFT FINGERS, PROFUNDUS FUNCTIONAL SUPERFUNTIONALIS WEAK NEURECTOMY ULNAR NERVE WRIST;  Surgeon: Cindee Salt, MD;  Location: [facility];  Service: Orthopedics;  Laterality: Left;   TUBAL LIGATION  2003   Past Surgical History:  Procedure Laterality Date   ORIF ELBOW FRACTURE Left 01/04/2014   Procedure: OPEN REDUCTION INTERNAL FIXATION  (ORIF) ELBOW/OLECRANON FRACTURE;  Surgeon: Harvie Junior, MD;  [facility];  Service: Orthopedics;  Laterality: Left;   ORIF HUMERUS FRACTURE Left 01/04/2014   Procedure: OPEN REDUCTION INTERNAL FIXATION (ORIF) HUMERAL SHAFT FRACTURE;  Surgeon: Harvie Junior, MD;  [facility];  Service: Orthopedics;  Laterality: Left;   ORIF RADIAL FRACTURE Left 01/04/2014   Procedure: OPEN REDUCTION INTERNAL FIXATION (ORIF) RADIAL FRACTURE;  Surgeon: Harvie Junior, MD;  [facility];  Service: Orthopedics;  Laterality: Left;   ORIF ULNAR FRACTURE Left 01/04/2014   Procedure: OPEN REDUCTION INTERNAL FIXATION (ORIF) ULNAR FRACTURE;  Surgeon: Harvie Junior, MD;  [facility];  Service: Orthopedics;  Laterality: Left;   PEG PLACEMENT N/A 01/12/2014   Procedure: PERCUTANEOUS ENDOSCOPIC GASTROSTOMY (PEG) PLACEMENT;  Surgeon: Cherylynn Ridges, MD;  Location: [facility] ENDOSCOPY;  Service: General;  Laterality: N/A;    bedside trach and peg   PEG TUBE REMOVAL  02/13/2015   PERCUTANEOUS TRACHEOSTOMY N/A 01/12/2014   Procedure: PERCUTANEOUS TRACHEOSTOMY;  Surgeon: Cherylynn Ridges, MD;  [facility];  Service: General;  Laterality: N/A;  BEDSIDE TRACH   TENDON TRANSFER Left 05/22/2017   Procedure: TRANSFER PROFUNDUS TO SUPERFICIALIS LENGTHENING LEFT FINGERS, PROFUNDUS FUNCTIONAL SUPERFUNTIONALIS WEAK NEURECTOMY ULNAR NERVE WRIST;  Surgeon: Cindee Salt, MD;  Location: [facility];  Service: Orthopedics;  Laterality: Left;   TUBAL LIGATION  2003   Past Medical History:  Diagnosis Date   Complication of anesthesia    severe crying, hypersensitivity to pain   GERD (gastroesophageal reflux disease)    History of traumatic brain injury 01/04/2014   Hypertension    under control with med., has been on med. since 2015   Spasticity    legs, feet, left arm, left hand   Subluxation of left shoulder joint 01/04/2014   ongoing   Unable to walk    BP 102/70   Pulse 73   Wt 160 lb (72.6 kg)   SpO2 97%   BMI 25.82  kg/m   Opioid Risk Score:   Fall Risk Score:  `1  Depression screen [facility] 2/9     08/13/2023   10:50 AM 09/04/2022    9:52 AM 12/22/2018   11:08 AM 12/29/2017    3:50 PM  Depression screen PHQ 2/9  Decreased Interest 0 0 0 0  Down, Depressed, Hopeless 0 0 0 0  PHQ - 2 Score 0 0 0 0     Review of Systems  Musculoskeletal:        Left leg and knee pain       Objective:   Physical Exam  General: No acute distress HEENT: NCAT, EOMI, oral membranes moist Cards: reg rate  Chest: normal effort Abdomen: Soft, NT, ND Skin: dry, intact Extremities: no edema Psych: pleasant and appropriate  Neuro: Contractedt and eblow/shoulder LUE with underlying tone. --no changes today--does tolerate movement a bit better. Tolerateds minimal  rom.   RLE: 3- to4 /4 right hamstring with increased hip flexor tightness and external rotation of the leg. RLE--foot contracted: in severe equinovarus position bilaterally, can extend left knee to neutral. With sl better foot rom.. improved attention and awareness. Impulsive with positive and negative--no change in  Musc: contracted LE's, LUE         Assessment & Plan:  1. TBI with spastic tetraplegia:  -HEP--HH PT, OT--re-ordered today to address rom, functional mobility, cognition---BAYADA -continue stretching program to tolerance -S/P tendon lengthening and transfers left wrist/fingers.  -CONTINUE baclofen and dantrium             -  2. This patient also requires a personal care attendant at all times.   3.  Left elbow pain/contracture: continue HEP. --have re-ordered HH therapies 7.  Constipation: soft/lax 8. Anemia:   9. Behavior: seems improved. - Continue depakote 250mg  bid for long term   -seroquel 100mg --no rf needed today 10. Pain relief: Tramadol or ibuprofen prn             -no drug swab today 11. Insomnia: seroquel  200mg  qhs with trazodone.  12. W/c in disrepair. Needs replacement. Asked mom to discuss with vendor as to which steps we  should take. Have requested w/c re-evals both standard and electric  -hospital bed needs to assessed also             -  I wrote orders for all the above       Twenty minutes of face to face patient care time were spent during this visit. All questions were encouraged and answered.  Follow up with me in 6 mos .

## 2024-03-24 ENCOUNTER — Telehealth: Payer: Self-pay | Admitting: Physical Medicine & Rehabilitation

## 2024-03-24 NOTE — Telephone Encounter (Signed)
 Patients mother called in , patient needs a new home health order for PT,OT& ST, sent to [facility] , she called office and they provided phone [phone] fax [phone]

## 2024-04-05 ENCOUNTER — Other Ambulatory Visit: Payer: Self-pay | Admitting: Physical Medicine & Rehabilitation

## 2024-04-06 ENCOUNTER — Telehealth: Payer: Self-pay | Admitting: *Deleted

## 2024-04-06 DIAGNOSIS — R4189 Other symptoms and signs involving cognitive functions and awareness: Secondary | ICD-10-CM

## 2024-04-06 DIAGNOSIS — M79675 Pain in left toe(s): Secondary | ICD-10-CM | POA: Diagnosis not present

## 2024-04-06 DIAGNOSIS — Z87820 Personal history of traumatic brain injury: Secondary | ICD-10-CM

## 2024-04-06 DIAGNOSIS — M79674 Pain in right toe(s): Secondary | ICD-10-CM | POA: Diagnosis not present

## 2024-04-06 DIAGNOSIS — B351 Tinea unguium: Secondary | ICD-10-CM | POA: Diagnosis not present

## 2024-04-06 DIAGNOSIS — G8250 Quadriplegia, unspecified: Secondary | ICD-10-CM

## 2024-04-06 NOTE — Telephone Encounter (Signed)
 Orders written for [facility]. They will need to be faxed to [facility].   Thanks!

## 2024-04-06 NOTE — Telephone Encounter (Signed)
 Look for  fax about Interior and spatial designer for MD to sign off on and need [facility] HH therapy service referrals for ST PT OT  since [facility] no longer services her area. She has spoke to [employer] and they do cover the area.

## 2024-04-15 DIAGNOSIS — G4700 Insomnia, unspecified: Secondary | ICD-10-CM | POA: Diagnosis not present

## 2024-04-15 DIAGNOSIS — M24522 Contracture, left elbow: Secondary | ICD-10-CM | POA: Diagnosis not present

## 2024-04-15 DIAGNOSIS — Z79899 Other long term (current) drug therapy: Secondary | ICD-10-CM | POA: Diagnosis not present

## 2024-04-15 DIAGNOSIS — I10 Essential (primary) hypertension: Secondary | ICD-10-CM | POA: Diagnosis not present

## 2024-04-15 DIAGNOSIS — Z791 Long term (current) use of non-steroidal anti-inflammatories (NSAID): Secondary | ICD-10-CM | POA: Diagnosis not present

## 2024-04-15 DIAGNOSIS — R4189 Other symptoms and signs involving cognitive functions and awareness: Secondary | ICD-10-CM | POA: Diagnosis not present

## 2024-04-15 DIAGNOSIS — Z556 Problems related to health literacy: Secondary | ICD-10-CM | POA: Diagnosis not present

## 2024-04-15 DIAGNOSIS — D649 Anemia, unspecified: Secondary | ICD-10-CM | POA: Diagnosis not present

## 2024-04-15 DIAGNOSIS — Z87891 Personal history of nicotine dependence: Secondary | ICD-10-CM | POA: Diagnosis not present

## 2024-04-15 DIAGNOSIS — G8250 Quadriplegia, unspecified: Secondary | ICD-10-CM | POA: Diagnosis not present

## 2024-04-15 DIAGNOSIS — Z635 Disruption of family by separation and divorce: Secondary | ICD-10-CM | POA: Diagnosis not present

## 2024-04-15 DIAGNOSIS — S069X0S Unspecified intracranial injury without loss of consciousness, sequela: Secondary | ICD-10-CM | POA: Diagnosis not present

## 2024-04-15 DIAGNOSIS — Z993 Dependence on wheelchair: Secondary | ICD-10-CM | POA: Diagnosis not present

## 2024-04-15 DIAGNOSIS — K5900 Constipation, unspecified: Secondary | ICD-10-CM | POA: Diagnosis not present

## 2024-04-22 ENCOUNTER — Other Ambulatory Visit: Payer: Self-pay | Admitting: Physical Medicine & Rehabilitation

## 2024-04-22 ENCOUNTER — Telehealth: Payer: Self-pay | Admitting: Physical Medicine & Rehabilitation

## 2024-04-22 DIAGNOSIS — S069X0S Unspecified intracranial injury without loss of consciousness, sequela: Secondary | ICD-10-CM | POA: Diagnosis not present

## 2024-04-22 DIAGNOSIS — G4700 Insomnia, unspecified: Secondary | ICD-10-CM | POA: Diagnosis not present

## 2024-04-22 DIAGNOSIS — D649 Anemia, unspecified: Secondary | ICD-10-CM | POA: Diagnosis not present

## 2024-04-22 DIAGNOSIS — R4189 Other symptoms and signs involving cognitive functions and awareness: Secondary | ICD-10-CM | POA: Diagnosis not present

## 2024-04-22 DIAGNOSIS — G8250 Quadriplegia, unspecified: Secondary | ICD-10-CM | POA: Diagnosis not present

## 2024-04-22 DIAGNOSIS — M24522 Contracture, left elbow: Secondary | ICD-10-CM | POA: Diagnosis not present

## 2024-04-22 NOTE — Telephone Encounter (Signed)
 Marily Shows ST with [facility] called requesting a verbal order for speech . Patient had evaluation today and would like to do 1x week for 8 weeks and also caregiver education - if she doesn't answer ok to leave on voicemail [phone]

## 2024-04-23 NOTE — Telephone Encounter (Signed)
 Approved.

## 2024-04-26 DIAGNOSIS — R4189 Other symptoms and signs involving cognitive functions and awareness: Secondary | ICD-10-CM | POA: Diagnosis not present

## 2024-04-26 DIAGNOSIS — D649 Anemia, unspecified: Secondary | ICD-10-CM | POA: Diagnosis not present

## 2024-04-26 DIAGNOSIS — S069X0S Unspecified intracranial injury without loss of consciousness, sequela: Secondary | ICD-10-CM | POA: Diagnosis not present

## 2024-04-26 DIAGNOSIS — G8250 Quadriplegia, unspecified: Secondary | ICD-10-CM | POA: Diagnosis not present

## 2024-04-26 DIAGNOSIS — M24522 Contracture, left elbow: Secondary | ICD-10-CM | POA: Diagnosis not present

## 2024-04-26 DIAGNOSIS — G4700 Insomnia, unspecified: Secondary | ICD-10-CM | POA: Diagnosis not present

## 2024-04-28 DIAGNOSIS — M24522 Contracture, left elbow: Secondary | ICD-10-CM | POA: Diagnosis not present

## 2024-04-28 DIAGNOSIS — R4189 Other symptoms and signs involving cognitive functions and awareness: Secondary | ICD-10-CM | POA: Diagnosis not present

## 2024-04-28 DIAGNOSIS — S069X0S Unspecified intracranial injury without loss of consciousness, sequela: Secondary | ICD-10-CM | POA: Diagnosis not present

## 2024-04-28 DIAGNOSIS — G8250 Quadriplegia, unspecified: Secondary | ICD-10-CM | POA: Diagnosis not present

## 2024-04-28 DIAGNOSIS — G4700 Insomnia, unspecified: Secondary | ICD-10-CM | POA: Diagnosis not present

## 2024-04-28 DIAGNOSIS — D649 Anemia, unspecified: Secondary | ICD-10-CM | POA: Diagnosis not present

## 2024-05-04 DIAGNOSIS — G8250 Quadriplegia, unspecified: Secondary | ICD-10-CM | POA: Diagnosis not present

## 2024-05-04 DIAGNOSIS — D649 Anemia, unspecified: Secondary | ICD-10-CM | POA: Diagnosis not present

## 2024-05-04 DIAGNOSIS — S069X0S Unspecified intracranial injury without loss of consciousness, sequela: Secondary | ICD-10-CM | POA: Diagnosis not present

## 2024-05-04 DIAGNOSIS — M24522 Contracture, left elbow: Secondary | ICD-10-CM | POA: Diagnosis not present

## 2024-05-04 DIAGNOSIS — R4189 Other symptoms and signs involving cognitive functions and awareness: Secondary | ICD-10-CM | POA: Diagnosis not present

## 2024-05-04 DIAGNOSIS — G4700 Insomnia, unspecified: Secondary | ICD-10-CM | POA: Diagnosis not present

## 2024-05-05 DIAGNOSIS — G4700 Insomnia, unspecified: Secondary | ICD-10-CM | POA: Diagnosis not present

## 2024-05-05 DIAGNOSIS — R4189 Other symptoms and signs involving cognitive functions and awareness: Secondary | ICD-10-CM | POA: Diagnosis not present

## 2024-05-05 DIAGNOSIS — G8250 Quadriplegia, unspecified: Secondary | ICD-10-CM | POA: Diagnosis not present

## 2024-05-05 DIAGNOSIS — S069X0S Unspecified intracranial injury without loss of consciousness, sequela: Secondary | ICD-10-CM | POA: Diagnosis not present

## 2024-05-05 DIAGNOSIS — M24522 Contracture, left elbow: Secondary | ICD-10-CM | POA: Diagnosis not present

## 2024-05-05 DIAGNOSIS — D649 Anemia, unspecified: Secondary | ICD-10-CM | POA: Diagnosis not present

## 2024-05-10 DIAGNOSIS — S069X0S Unspecified intracranial injury without loss of consciousness, sequela: Secondary | ICD-10-CM | POA: Diagnosis not present

## 2024-05-10 DIAGNOSIS — D649 Anemia, unspecified: Secondary | ICD-10-CM | POA: Diagnosis not present

## 2024-05-10 DIAGNOSIS — G4700 Insomnia, unspecified: Secondary | ICD-10-CM | POA: Diagnosis not present

## 2024-05-10 DIAGNOSIS — G8250 Quadriplegia, unspecified: Secondary | ICD-10-CM | POA: Diagnosis not present

## 2024-05-10 DIAGNOSIS — R4189 Other symptoms and signs involving cognitive functions and awareness: Secondary | ICD-10-CM | POA: Diagnosis not present

## 2024-05-10 DIAGNOSIS — M24522 Contracture, left elbow: Secondary | ICD-10-CM | POA: Diagnosis not present

## 2024-05-12 ENCOUNTER — Other Ambulatory Visit: Payer: Self-pay | Admitting: Physical Medicine & Rehabilitation

## 2024-05-12 DIAGNOSIS — S069X0S Unspecified intracranial injury without loss of consciousness, sequela: Secondary | ICD-10-CM | POA: Diagnosis not present

## 2024-05-12 DIAGNOSIS — D649 Anemia, unspecified: Secondary | ICD-10-CM | POA: Diagnosis not present

## 2024-05-12 DIAGNOSIS — M24522 Contracture, left elbow: Secondary | ICD-10-CM | POA: Diagnosis not present

## 2024-05-12 DIAGNOSIS — R4189 Other symptoms and signs involving cognitive functions and awareness: Secondary | ICD-10-CM | POA: Diagnosis not present

## 2024-05-12 DIAGNOSIS — G4700 Insomnia, unspecified: Secondary | ICD-10-CM | POA: Diagnosis not present

## 2024-05-12 DIAGNOSIS — G8250 Quadriplegia, unspecified: Secondary | ICD-10-CM | POA: Diagnosis not present

## 2024-05-14 ENCOUNTER — Other Ambulatory Visit: Payer: Self-pay | Admitting: Physical Medicine & Rehabilitation

## 2024-05-15 DIAGNOSIS — G8250 Quadriplegia, unspecified: Secondary | ICD-10-CM | POA: Diagnosis not present

## 2024-05-15 DIAGNOSIS — D649 Anemia, unspecified: Secondary | ICD-10-CM | POA: Diagnosis not present

## 2024-05-15 DIAGNOSIS — M24522 Contracture, left elbow: Secondary | ICD-10-CM | POA: Diagnosis not present

## 2024-05-15 DIAGNOSIS — I10 Essential (primary) hypertension: Secondary | ICD-10-CM | POA: Diagnosis not present

## 2024-05-15 DIAGNOSIS — Z993 Dependence on wheelchair: Secondary | ICD-10-CM | POA: Diagnosis not present

## 2024-05-15 DIAGNOSIS — Z79899 Other long term (current) drug therapy: Secondary | ICD-10-CM | POA: Diagnosis not present

## 2024-05-15 DIAGNOSIS — Z556 Problems related to health literacy: Secondary | ICD-10-CM | POA: Diagnosis not present

## 2024-05-15 DIAGNOSIS — G4700 Insomnia, unspecified: Secondary | ICD-10-CM | POA: Diagnosis not present

## 2024-05-15 DIAGNOSIS — Z791 Long term (current) use of non-steroidal anti-inflammatories (NSAID): Secondary | ICD-10-CM | POA: Diagnosis not present

## 2024-05-15 DIAGNOSIS — S069X0S Unspecified intracranial injury without loss of consciousness, sequela: Secondary | ICD-10-CM | POA: Diagnosis not present

## 2024-05-15 DIAGNOSIS — R4189 Other symptoms and signs involving cognitive functions and awareness: Secondary | ICD-10-CM | POA: Diagnosis not present

## 2024-05-15 DIAGNOSIS — Z635 Disruption of family by separation and divorce: Secondary | ICD-10-CM | POA: Diagnosis not present

## 2024-05-15 DIAGNOSIS — K5900 Constipation, unspecified: Secondary | ICD-10-CM | POA: Diagnosis not present

## 2024-05-15 DIAGNOSIS — Z87891 Personal history of nicotine dependence: Secondary | ICD-10-CM | POA: Diagnosis not present

## 2024-05-18 ENCOUNTER — Other Ambulatory Visit: Payer: Self-pay | Admitting: Physical Medicine & Rehabilitation

## 2024-05-18 DIAGNOSIS — G4700 Insomnia, unspecified: Secondary | ICD-10-CM | POA: Diagnosis not present

## 2024-05-18 DIAGNOSIS — M24522 Contracture, left elbow: Secondary | ICD-10-CM | POA: Diagnosis not present

## 2024-05-18 DIAGNOSIS — R4189 Other symptoms and signs involving cognitive functions and awareness: Secondary | ICD-10-CM | POA: Diagnosis not present

## 2024-05-18 DIAGNOSIS — G8250 Quadriplegia, unspecified: Secondary | ICD-10-CM | POA: Diagnosis not present

## 2024-05-18 DIAGNOSIS — S069X0S Unspecified intracranial injury without loss of consciousness, sequela: Secondary | ICD-10-CM | POA: Diagnosis not present

## 2024-05-18 DIAGNOSIS — D649 Anemia, unspecified: Secondary | ICD-10-CM | POA: Diagnosis not present

## 2024-05-20 DIAGNOSIS — D649 Anemia, unspecified: Secondary | ICD-10-CM | POA: Diagnosis not present

## 2024-05-20 DIAGNOSIS — R4189 Other symptoms and signs involving cognitive functions and awareness: Secondary | ICD-10-CM | POA: Diagnosis not present

## 2024-05-20 DIAGNOSIS — S069X0S Unspecified intracranial injury without loss of consciousness, sequela: Secondary | ICD-10-CM | POA: Diagnosis not present

## 2024-05-20 DIAGNOSIS — G8250 Quadriplegia, unspecified: Secondary | ICD-10-CM | POA: Diagnosis not present

## 2024-05-20 DIAGNOSIS — G4700 Insomnia, unspecified: Secondary | ICD-10-CM | POA: Diagnosis not present

## 2024-05-20 DIAGNOSIS — M24522 Contracture, left elbow: Secondary | ICD-10-CM | POA: Diagnosis not present

## 2024-05-24 ENCOUNTER — Telehealth: Payer: Self-pay | Admitting: Physical Medicine & Rehabilitation

## 2024-05-24 DIAGNOSIS — G4700 Insomnia, unspecified: Secondary | ICD-10-CM | POA: Diagnosis not present

## 2024-05-24 DIAGNOSIS — M24522 Contracture, left elbow: Secondary | ICD-10-CM | POA: Diagnosis not present

## 2024-05-24 DIAGNOSIS — D649 Anemia, unspecified: Secondary | ICD-10-CM | POA: Diagnosis not present

## 2024-05-24 DIAGNOSIS — S069X0S Unspecified intracranial injury without loss of consciousness, sequela: Secondary | ICD-10-CM | POA: Diagnosis not present

## 2024-05-24 DIAGNOSIS — R4189 Other symptoms and signs involving cognitive functions and awareness: Secondary | ICD-10-CM | POA: Diagnosis not present

## 2024-05-24 DIAGNOSIS — G8250 Quadriplegia, unspecified: Secondary | ICD-10-CM | POA: Diagnosis not present

## 2024-05-24 NOTE — Telephone Encounter (Signed)
 Darlene from [facility] called in , during OT session patient is in need of a new mechanical wheelchair reclining. Due to special  positional needs patient has outgrown her current one and possibly will need to be measured - would need rx sent to [facility]  . And also patient has an electric wheelchair and the foot rest have not been installed - per patient [facility] does the repair but need a script sent in , patient got electric wheelchair from [place] [employer]

## 2024-05-26 DIAGNOSIS — M24522 Contracture, left elbow: Secondary | ICD-10-CM | POA: Diagnosis not present

## 2024-05-26 DIAGNOSIS — G8250 Quadriplegia, unspecified: Secondary | ICD-10-CM | POA: Diagnosis not present

## 2024-05-26 DIAGNOSIS — R4189 Other symptoms and signs involving cognitive functions and awareness: Secondary | ICD-10-CM | POA: Diagnosis not present

## 2024-05-26 DIAGNOSIS — D649 Anemia, unspecified: Secondary | ICD-10-CM | POA: Diagnosis not present

## 2024-05-26 DIAGNOSIS — G4700 Insomnia, unspecified: Secondary | ICD-10-CM | POA: Diagnosis not present

## 2024-05-26 DIAGNOSIS — S069X0S Unspecified intracranial injury without loss of consciousness, sequela: Secondary | ICD-10-CM | POA: Diagnosis not present

## 2024-06-01 DIAGNOSIS — D649 Anemia, unspecified: Secondary | ICD-10-CM | POA: Diagnosis not present

## 2024-06-01 DIAGNOSIS — R4189 Other symptoms and signs involving cognitive functions and awareness: Secondary | ICD-10-CM | POA: Diagnosis not present

## 2024-06-01 DIAGNOSIS — G8250 Quadriplegia, unspecified: Secondary | ICD-10-CM | POA: Diagnosis not present

## 2024-06-01 DIAGNOSIS — S069X0S Unspecified intracranial injury without loss of consciousness, sequela: Secondary | ICD-10-CM | POA: Diagnosis not present

## 2024-06-01 DIAGNOSIS — M24522 Contracture, left elbow: Secondary | ICD-10-CM | POA: Diagnosis not present

## 2024-06-01 DIAGNOSIS — G4700 Insomnia, unspecified: Secondary | ICD-10-CM | POA: Diagnosis not present

## 2024-06-08 DIAGNOSIS — G4700 Insomnia, unspecified: Secondary | ICD-10-CM | POA: Diagnosis not present

## 2024-06-08 DIAGNOSIS — G8250 Quadriplegia, unspecified: Secondary | ICD-10-CM | POA: Diagnosis not present

## 2024-06-08 DIAGNOSIS — S069X0S Unspecified intracranial injury without loss of consciousness, sequela: Secondary | ICD-10-CM | POA: Diagnosis not present

## 2024-06-08 DIAGNOSIS — D649 Anemia, unspecified: Secondary | ICD-10-CM | POA: Diagnosis not present

## 2024-06-08 DIAGNOSIS — M24522 Contracture, left elbow: Secondary | ICD-10-CM | POA: Diagnosis not present

## 2024-06-08 DIAGNOSIS — R4189 Other symptoms and signs involving cognitive functions and awareness: Secondary | ICD-10-CM | POA: Diagnosis not present

## 2024-06-21 ENCOUNTER — Other Ambulatory Visit: Payer: Self-pay | Admitting: Physical Medicine & Rehabilitation

## 2024-06-21 DIAGNOSIS — S069XAS Other symptoms and signs involving appearance and behavior: Secondary | ICD-10-CM

## 2024-06-21 DIAGNOSIS — Z87820 Personal history of traumatic brain injury: Secondary | ICD-10-CM

## 2024-08-03 ENCOUNTER — Other Ambulatory Visit: Payer: Self-pay | Admitting: Physical Medicine & Rehabilitation

## 2024-08-03 DIAGNOSIS — Z87820 Personal history of traumatic brain injury: Secondary | ICD-10-CM

## 2024-08-03 DIAGNOSIS — S069XAS Unspecified intracranial injury with loss of consciousness status unknown, sequela: Secondary | ICD-10-CM

## 2024-08-03 DIAGNOSIS — F5104 Psychophysiologic insomnia: Secondary | ICD-10-CM

## 2024-08-10 ENCOUNTER — Other Ambulatory Visit: Payer: Self-pay | Admitting: Physical Medicine & Rehabilitation

## 2024-08-17 NOTE — Progress Notes (Unsigned)
 Subjective:    Patient ID: [name], female    DOB: 26-Jan-1981, 43 y.o.   MRN: [id number]  HPI  [name] is here in follow up of her spastic tetraplegia. She has had more movement in her left leg and can lift it a bit now. It hasn't translated into any new functional mobility. She received therapy at home for a few weeks after our last visit.   Mom reports increased discharge during her period and [name] has been more emotional during those periods.   She does have a power assist door and she goes outside on the porch on her own.   From a sleeping standpoint, she generally is sleeping per mom. [name] sometimes sleep is a problem. She often sleeps with TV on.   Skin is intact. Bowel and bladder are regular.     Pain Inventory Average Pain 1 Pain Right Now 2 My pain is intermittent, constant, dull, stabbing, aching, and stiffness  In the last 24 hours, has pain interfered with the following? General activity 0 Relation with others 0 Enjoyment of life 0 What TIME of day is your pain at its worst? evening Sleep (in general) Good  Pain is worse with: some activites Pain improves with: heat/ice, therapy/exercise, and medication Relief from Meds: 10  No family history on file. Social History   Socioeconomic History   Marital status: Divorced    Spouse name: Not on file   Number of children: Not on file   Years of education: Not on file   Highest education level: Not on file  Occupational History   Not on file  Tobacco Use   Smoking status: Former    Current packs/day: 0.00    Types: Cigarettes    Quit date: 01/04/2014    Years since quitting: 10.6   Smokeless tobacco: Never  Vaping Use   Vaping status: Never Used  Substance and Sexual Activity   Alcohol use: No   Drug use: No   Sexual activity: Not on file  Other Topics Concern   Not on file  Social History Narrative   Not on file   Social Drivers of Health   Financial Resource Strain: Not on file   Food Insecurity: Not on file  Transportation Needs: Not on file  Physical Activity: Not on file  Stress: Not on file  Social Connections: Not on file   Past Surgical History:  Procedure Laterality Date   ORIF ELBOW FRACTURE Left 01/04/2014   Procedure: OPEN REDUCTION INTERNAL FIXATION (ORIF) ELBOW/OLECRANON FRACTURE;  Surgeon: Norleen LITTIE Gavel, MD;  [facility];  Service: Orthopedics;  Laterality: Left;   ORIF HUMERUS FRACTURE Left 01/04/2014   Procedure: OPEN REDUCTION INTERNAL FIXATION (ORIF) HUMERAL SHAFT FRACTURE;  Surgeon: Norleen LITTIE Gavel, MD;  [facility];  Service: Orthopedics;  Laterality: Left;   ORIF RADIAL FRACTURE Left 01/04/2014   Procedure: OPEN REDUCTION INTERNAL FIXATION (ORIF) RADIAL FRACTURE;  Surgeon: Norleen LITTIE Gavel, MD;  [facility];  Service: Orthopedics;  Laterality: Left;   ORIF ULNAR FRACTURE Left 01/04/2014   Procedure: OPEN REDUCTION INTERNAL FIXATION (ORIF) ULNAR FRACTURE;  Surgeon: Norleen LITTIE Gavel, MD;  [facility];  Service: Orthopedics;  Laterality: Left;   PEG PLACEMENT N/A 01/12/2014   Procedure: PERCUTANEOUS ENDOSCOPIC GASTROSTOMY (PEG) PLACEMENT;  Surgeon: Lynwood MALVA Pina, MD;  Location: [facility] ENDOSCOPY;  Service: General;  Laterality: N/A;    bedside trach and peg   PEG TUBE REMOVAL  02/13/2015   PERCUTANEOUS TRACHEOSTOMY N/A  01/12/2014   Procedure: PERCUTANEOUS TRACHEOSTOMY;  Surgeon: Lynwood MALVA Pina, MD;  Location: [facility] OR;  Service: General;  Laterality: N/A;  BEDSIDE TRACH   TENDON TRANSFER Left 05/22/2017   Procedure: TRANSFER PROFUNDUS TO SUPERFICIALIS LENGTHENING LEFT FINGERS, PROFUNDUS FUNCTIONAL SUPERFUNTIONALIS WEAK NEURECTOMY ULNAR NERVE WRIST;  Surgeon: Murrell Kuba, MD;  Location: [facility];  Service: Orthopedics;  Laterality: Left;   TUBAL LIGATION  2003   Past Surgical History:  Procedure Laterality Date   ORIF ELBOW FRACTURE Left 01/04/2014   Procedure: OPEN REDUCTION INTERNAL FIXATION (ORIF) ELBOW/OLECRANON FRACTURE;  Surgeon: Norleen LITTIE Gavel, MD;  [facility];  Service: Orthopedics;  Laterality: Left;   ORIF HUMERUS FRACTURE Left 01/04/2014   Procedure: OPEN REDUCTION INTERNAL FIXATION (ORIF) HUMERAL SHAFT FRACTURE;  Surgeon: Norleen LITTIE Gavel, MD;  [facility];  Service: Orthopedics;  Laterality: Left;   ORIF RADIAL FRACTURE Left 01/04/2014   Procedure: OPEN REDUCTION INTERNAL FIXATION (ORIF) RADIAL FRACTURE;  Surgeon: Norleen LITTIE Gavel, MD;  [facility];  Service: Orthopedics;  Laterality: Left;   ORIF ULNAR FRACTURE Left 01/04/2014   Procedure: OPEN REDUCTION INTERNAL FIXATION (ORIF) ULNAR FRACTURE;  Surgeon: Norleen LITTIE Gavel, MD;  [facility];  Service: Orthopedics;  Laterality: Left;   PEG PLACEMENT N/A 01/12/2014   Procedure: PERCUTANEOUS ENDOSCOPIC GASTROSTOMY (PEG) PLACEMENT;  Surgeon: Lynwood MALVA Pina, MD;  Location: [facility] ENDOSCOPY;  Service: General;  Laterality: N/A;    bedside trach and peg   PEG TUBE REMOVAL  02/13/2015   PERCUTANEOUS TRACHEOSTOMY N/A 01/12/2014   Procedure: PERCUTANEOUS TRACHEOSTOMY;  Surgeon: Lynwood MALVA Pina, MD;  [facility];  Service: General;  Laterality: N/A;  BEDSIDE TRACH   TENDON TRANSFER Left 05/22/2017   Procedure: TRANSFER PROFUNDUS TO SUPERFICIALIS LENGTHENING LEFT FINGERS, PROFUNDUS FUNCTIONAL SUPERFUNTIONALIS WEAK NEURECTOMY ULNAR NERVE WRIST;  Surgeon: Murrell Kuba, MD;  Location: [facility];  Service: Orthopedics;  Laterality: Left;   TUBAL LIGATION  2003   Past Medical History:  Diagnosis Date   Complication of anesthesia    severe crying, hypersensitivity to pain   GERD (gastroesophageal reflux disease)    History of traumatic brain injury 01/04/2014   Hypertension    under control with med., has been on med. since 2015   Spasticity    legs, feet, left arm, left hand   Subluxation of left shoulder joint 01/04/2014   ongoing   Unable to walk    There were no vitals taken for this visit.  Opioid Risk Score:   Fall Risk Score:  `1  Depression screen [facility]  2/9     08/13/2023   10:50 AM 09/04/2022    9:52 AM 12/22/2018   11:08 AM 12/29/2017    3:50 PM  Depression screen PHQ 2/9  Decreased Interest 0 0 0 0  Down, Depressed, Hopeless 0 0 0 0  PHQ - 2 Score 0 0 0 0    Review of Systems  Musculoskeletal:  Positive for gait problem.       Pain in both hips, both feet, left knee, left arm  All other systems reviewed and are negative.      Objective:   Physical Exam General: No acute distress HEENT: NCAT, EOMI, oral membranes moist Cards: reg rate  Chest: normal effort Abdomen: Soft, NT, ND Skin: dry, intact Extremities: no edema Psych: pleasant and appropriate. Less impulsive today. Very engaging.   Neuro: Contractedt and eblow/shoulder LUE with underlying tone. --no changes today--does tolerate movement a bit better.  Tolerateds minimal rom.   RLE: 3- to4 /4 right hamstring with increased hip flexor tightness and external rotation of the leg. RLE--foot contracted: in severe equinovarus position bilaterally, can extend left knee to neutral. Lifted left leg off chair today. With sl better foot rom.. improved attention and awareness.  Musc: contracted LE's, LUE really unchanged         Assessment & Plan:  1. TBI with spastic tetraplegia:  -HEP--continue HEP -S/P tendon lengthening and transfers left wrist/fingers.  -maintain baclofen  and dantrium              -  2. This patient also requires a personal care attendant at all times.   3.  Left elbow pain/contracture: continue HEP. --have re-ordered HH therapies 7.  Constipation: soft/lax 8.  Anemia:   9. Behavior: seems improved. - Continue depakote  250mg  bid for long term   -seroquel  100mg --NO RF TODAY -discussed mechanisms to de-escalate when her mood escalates 10. Pain relief: Tramadol  or ibuprofen  prn             - drug swab today 11. Insomnia: seroquel   200mg  qhs with trazodone . -discussed sleep hygiene today  12. W/c in disrepair. Has another year before she' eligible for a  new chair             -         Twenty minutes of face to face patient care time were spent during this visit. All questions were encouraged and answered.  Follow up with me in 6 mos .

## 2024-08-18 ENCOUNTER — Encounter: Payer: Self-pay | Admitting: Physical Medicine & Rehabilitation

## 2024-08-18 ENCOUNTER — Encounter: Attending: Physical Medicine & Rehabilitation | Admitting: Physical Medicine & Rehabilitation

## 2024-08-18 VITALS — BP 104/68 | HR 72 | Ht 66.0 in

## 2024-08-18 DIAGNOSIS — Z5181 Encounter for therapeutic drug level monitoring: Secondary | ICD-10-CM | POA: Insufficient documentation

## 2024-08-18 DIAGNOSIS — R4189 Other symptoms and signs involving cognitive functions and awareness: Secondary | ICD-10-CM | POA: Insufficient documentation

## 2024-08-18 DIAGNOSIS — Z87820 Personal history of traumatic brain injury: Secondary | ICD-10-CM | POA: Insufficient documentation

## 2024-08-18 DIAGNOSIS — G8250 Quadriplegia, unspecified: Secondary | ICD-10-CM | POA: Insufficient documentation

## 2024-08-18 DIAGNOSIS — Z79891 Long term (current) use of opiate analgesic: Secondary | ICD-10-CM | POA: Insufficient documentation

## 2024-08-18 DIAGNOSIS — S069XAS Unspecified intracranial injury with loss of consciousness status unknown, sequela: Secondary | ICD-10-CM | POA: Diagnosis present

## 2024-08-18 DIAGNOSIS — G894 Chronic pain syndrome: Secondary | ICD-10-CM | POA: Diagnosis not present

## 2024-08-18 NOTE — Patient Instructions (Addendum)
 ALWAYS FEEL FREE TO CALL OUR OFFICE WITH ANY PROBLEMS OR QUESTIONS [phone])  **PLEASE NOTE** ALL MEDICATION REFILL REQUESTS (INCLUDING CONTROLLED SUBSTANCES) NEED TO BE MADE AT LEAST 7 DAYS PRIOR TO REFILL BEING DUE. ANY REFILL REQUESTS INSIDE THAT TIME FRAME MAY RESULT IN DELAYS IN RECEIVING YOUR PRESCRIPTION.    SLEEP: TURN OFF DISTRACTIONS (NOISES, LIGHTS, PEOPLE, PHONES) WHEN YOU TRY TO GO TO BED. AVOID TAKING LONG NAPS DURING THE DAY.   MOOD: GET SLEEP, WORK ON DIVERSIONS WHEN YOU START FEEL TO WOUND UP. GO OUTSIDE, GET SUN, LISTEN TO MUSIC, MEDITATE/CLOSE YOUR EYES.

## 2024-08-22 LAB — DRUG TOX MONITOR 1 W/CONF, ORAL FLD
Alprazolam: NEGATIVE ng/mL (ref <0.50)
Aminoclonazepam: NEGATIVE ng/mL (ref <0.50)
Amphetamines: NEGATIVE ng/mL (ref <10)
Barbiturates: NEGATIVE ng/mL (ref <10)
Benzodiazepines: POSITIVE ng/mL — AB (ref <0.50)
Buprenorphine: NEGATIVE ng/mL (ref <0.10)
Chlordiazepoxide: NEGATIVE ng/mL (ref <0.50)
Clonazepam: NEGATIVE ng/mL (ref <0.50)
Cocaine: NEGATIVE ng/mL (ref <5.0)
Diazepam: NEGATIVE ng/mL (ref <0.50)
Fentanyl: NEGATIVE ng/mL (ref <0.10)
Flunitrazepam: NEGATIVE ng/mL (ref <0.50)
Flurazepam: NEGATIVE ng/mL (ref <0.50)
Heroin Metabolite: NEGATIVE ng/mL (ref <1.0)
Lorazepam: 0.92 ng/mL — ABNORMAL HIGH (ref <0.50)
MARIJUANA: NEGATIVE ng/mL (ref <2.5)
MDMA: NEGATIVE ng/mL (ref <10)
Meprobamate: NEGATIVE ng/mL (ref <2.5)
Methadone: NEGATIVE ng/mL (ref <5.0)
Midazolam: NEGATIVE ng/mL (ref <0.50)
Nicotine Metabolite: NEGATIVE ng/mL (ref <5.0)
Nordiazepam: NEGATIVE ng/mL (ref <0.50)
Opiates: NEGATIVE ng/mL (ref <2.5)
Oxazepam: NEGATIVE ng/mL (ref <0.50)
Phencyclidine: NEGATIVE ng/mL (ref <10)
Tapentadol: NEGATIVE ng/mL (ref <5.0)
Temazepam: NEGATIVE ng/mL (ref <0.50)
Tramadol: >500 ng/mL — ABNORMAL HIGH (ref <5.0)
Tramadol: POSITIVE ng/mL — AB (ref <5.0)
Triazolam: NEGATIVE ng/mL (ref <0.50)
Zolpidem: NEGATIVE ng/mL (ref <5.0)

## 2024-08-22 LAB — DRUG TOX ALC METAB W/CON, ORAL FLD: Alcohol Metabolite: NEGATIVE ng/mL (ref <25)

## 2024-09-27 ENCOUNTER — Encounter: Payer: Self-pay | Admitting: Physical Medicine & Rehabilitation

## 2024-09-28 ENCOUNTER — Encounter: Payer: Self-pay | Admitting: Physical Medicine & Rehabilitation

## 2024-10-14 ENCOUNTER — Encounter: Payer: Self-pay | Admitting: Physical Medicine & Rehabilitation

## 2024-10-15 MED ORDER — LORAZEPAM 0.5 MG PO TABS: ORAL_TABLET | ORAL | 5 refills | Status: AC

## 2024-10-15 NOTE — Telephone Encounter (Signed)
Rx writen

## 2025-02-16 ENCOUNTER — Ambulatory Visit: Admitting: Physical Medicine & Rehabilitation
